# Patient Record
Sex: Female | Born: 1935 | Race: White | Hispanic: No | Marital: Married | Smoking: Former smoker
Health system: Southern US, Community
[De-identification: ages and names within clinical notes are randomized; demographics above are authoritative.]

## PROBLEM LIST (undated history)

## (undated) DIAGNOSIS — A64 Unspecified sexually transmitted disease: Secondary | ICD-10-CM

## (undated) DIAGNOSIS — N838 Other noninflammatory disorders of ovary, fallopian tube and broad ligament: Secondary | ICD-10-CM

## (undated) DIAGNOSIS — N952 Postmenopausal atrophic vaginitis: Secondary | ICD-10-CM

## (undated) DIAGNOSIS — D219 Benign neoplasm of connective and other soft tissue, unspecified: Secondary | ICD-10-CM

## (undated) DIAGNOSIS — N816 Rectocele: Secondary | ICD-10-CM

## (undated) DIAGNOSIS — E7800 Pure hypercholesterolemia, unspecified: Secondary | ICD-10-CM

## (undated) DIAGNOSIS — N814 Uterovaginal prolapse, unspecified: Secondary | ICD-10-CM

## (undated) DIAGNOSIS — S2249XA Multiple fractures of ribs, unspecified side, initial encounter for closed fracture: Secondary | ICD-10-CM

## (undated) DIAGNOSIS — M1990 Unspecified osteoarthritis, unspecified site: Secondary | ICD-10-CM

## (undated) DIAGNOSIS — R7989 Other specified abnormal findings of blood chemistry: Secondary | ICD-10-CM

## (undated) DIAGNOSIS — I10 Essential (primary) hypertension: Secondary | ICD-10-CM

## (undated) DIAGNOSIS — S42009A Fracture of unspecified part of unspecified clavicle, initial encounter for closed fracture: Secondary | ICD-10-CM

## (undated) DIAGNOSIS — E039 Hypothyroidism, unspecified: Secondary | ICD-10-CM

## (undated) DIAGNOSIS — IMO0002 Reserved for concepts with insufficient information to code with codable children: Secondary | ICD-10-CM

## (undated) DIAGNOSIS — S129XXA Fracture of neck, unspecified, initial encounter: Secondary | ICD-10-CM

## (undated) HISTORY — PX: OOPHORECTOMY: SHX86

## (undated) HISTORY — DX: Other noninflammatory disorders of ovary, fallopian tube and broad ligament: N83.8

## (undated) HISTORY — DX: Other specified abnormal findings of blood chemistry: R79.89

## (undated) HISTORY — PX: OTHER SURGICAL HISTORY: SHX169

## (undated) HISTORY — DX: Fracture of neck, unspecified, initial encounter: S12.9XXA

## (undated) HISTORY — DX: Pure hypercholesterolemia, unspecified: E78.00

## (undated) HISTORY — PX: CATARACT EXTRACTION: SUR2

## (undated) HISTORY — PX: FOOT SURGERY: SHX648

## (undated) HISTORY — DX: Hypothyroidism, unspecified: E03.9

## (undated) HISTORY — PX: THYROIDECTOMY, PARTIAL: SHX18

## (undated) HISTORY — DX: Essential (primary) hypertension: I10

## (undated) HISTORY — DX: Reserved for concepts with insufficient information to code with codable children: IMO0002

## (undated) HISTORY — DX: Benign neoplasm of connective and other soft tissue, unspecified: D21.9

## (undated) HISTORY — DX: Uterovaginal prolapse, unspecified: N81.4

## (undated) HISTORY — DX: Rectocele: N81.6

## (undated) HISTORY — DX: Unspecified osteoarthritis, unspecified site: M19.90

## (undated) HISTORY — DX: Postmenopausal atrophic vaginitis: N95.2

## (undated) HISTORY — DX: Fracture of unspecified part of unspecified clavicle, initial encounter for closed fracture: S42.009A

## (undated) HISTORY — DX: Multiple fractures of ribs, unspecified side, initial encounter for closed fracture: S22.49XA

## (undated) HISTORY — DX: Unspecified sexually transmitted disease: A64

---

## 1998-07-31 ENCOUNTER — Emergency Department (HOSPITAL_COMMUNITY): Admission: EM | Admit: 1998-07-31 | Discharge: 1998-07-31 | Payer: Self-pay | Admitting: Emergency Medicine

## 1999-01-17 ENCOUNTER — Other Ambulatory Visit: Admission: RE | Admit: 1999-01-17 | Discharge: 1999-01-17 | Payer: Self-pay | Admitting: Obstetrics and Gynecology

## 2000-01-18 ENCOUNTER — Other Ambulatory Visit: Admission: RE | Admit: 2000-01-18 | Discharge: 2000-01-18 | Payer: Self-pay | Admitting: Obstetrics and Gynecology

## 2000-02-09 ENCOUNTER — Encounter (INDEPENDENT_AMBULATORY_CARE_PROVIDER_SITE_OTHER): Payer: Self-pay

## 2000-02-09 ENCOUNTER — Other Ambulatory Visit: Admission: RE | Admit: 2000-02-09 | Discharge: 2000-02-09 | Payer: Self-pay | Admitting: Obstetrics and Gynecology

## 2001-01-06 ENCOUNTER — Encounter: Admission: RE | Admit: 2001-01-06 | Discharge: 2001-01-06 | Payer: Self-pay | Admitting: Specialist

## 2001-01-06 ENCOUNTER — Encounter: Payer: Self-pay | Admitting: Specialist

## 2001-01-17 ENCOUNTER — Other Ambulatory Visit: Admission: RE | Admit: 2001-01-17 | Discharge: 2001-01-17 | Payer: Self-pay | Admitting: Obstetrics and Gynecology

## 2001-03-21 ENCOUNTER — Encounter (INDEPENDENT_AMBULATORY_CARE_PROVIDER_SITE_OTHER): Payer: Self-pay | Admitting: Specialist

## 2001-03-21 ENCOUNTER — Ambulatory Visit (HOSPITAL_COMMUNITY): Admission: RE | Admit: 2001-03-21 | Discharge: 2001-03-21 | Payer: Self-pay | Admitting: Oncology

## 2001-03-21 ENCOUNTER — Encounter (HOSPITAL_COMMUNITY): Payer: Self-pay | Admitting: Oncology

## 2001-04-04 ENCOUNTER — Encounter (HOSPITAL_COMMUNITY): Payer: Self-pay | Admitting: Oncology

## 2001-04-04 ENCOUNTER — Ambulatory Visit (HOSPITAL_COMMUNITY): Admission: RE | Admit: 2001-04-04 | Discharge: 2001-04-04 | Payer: Self-pay | Admitting: Oncology

## 2001-06-19 ENCOUNTER — Ambulatory Visit (HOSPITAL_COMMUNITY): Admission: RE | Admit: 2001-06-19 | Discharge: 2001-06-19 | Payer: Self-pay | Admitting: *Deleted

## 2001-06-19 ENCOUNTER — Encounter (INDEPENDENT_AMBULATORY_CARE_PROVIDER_SITE_OTHER): Payer: Self-pay

## 2002-01-21 ENCOUNTER — Other Ambulatory Visit: Admission: RE | Admit: 2002-01-21 | Discharge: 2002-01-21 | Payer: Self-pay | Admitting: Obstetrics and Gynecology

## 2002-06-04 HISTORY — PX: VAGINAL HYSTERECTOMY: SUR661

## 2002-07-23 ENCOUNTER — Encounter: Payer: Self-pay | Admitting: Obstetrics and Gynecology

## 2002-07-28 ENCOUNTER — Inpatient Hospital Stay (HOSPITAL_COMMUNITY): Admission: RE | Admit: 2002-07-28 | Discharge: 2002-07-30 | Payer: Self-pay | Admitting: Obstetrics and Gynecology

## 2002-07-28 ENCOUNTER — Encounter (INDEPENDENT_AMBULATORY_CARE_PROVIDER_SITE_OTHER): Payer: Self-pay | Admitting: Specialist

## 2002-11-20 ENCOUNTER — Encounter: Payer: Self-pay | Admitting: Specialist

## 2002-11-26 ENCOUNTER — Inpatient Hospital Stay (HOSPITAL_COMMUNITY): Admission: RE | Admit: 2002-11-26 | Discharge: 2002-11-30 | Payer: Self-pay | Admitting: Specialist

## 2003-12-17 ENCOUNTER — Encounter: Admission: RE | Admit: 2003-12-17 | Discharge: 2003-12-17 | Payer: Self-pay | Admitting: Obstetrics and Gynecology

## 2004-01-18 ENCOUNTER — Encounter: Admission: RE | Admit: 2004-01-18 | Discharge: 2004-01-18 | Payer: Self-pay | Admitting: Family Medicine

## 2004-01-24 ENCOUNTER — Other Ambulatory Visit: Admission: RE | Admit: 2004-01-24 | Discharge: 2004-01-24 | Payer: Self-pay | Admitting: Obstetrics and Gynecology

## 2004-03-27 ENCOUNTER — Emergency Department (HOSPITAL_COMMUNITY): Admission: EM | Admit: 2004-03-27 | Discharge: 2004-03-28 | Payer: Self-pay | Admitting: Emergency Medicine

## 2005-04-16 ENCOUNTER — Encounter: Admission: RE | Admit: 2005-04-16 | Discharge: 2005-04-16 | Payer: Self-pay | Admitting: Family Medicine

## 2006-01-30 ENCOUNTER — Other Ambulatory Visit: Admission: RE | Admit: 2006-01-30 | Discharge: 2006-01-30 | Payer: Self-pay | Admitting: Obstetrics and Gynecology

## 2007-06-05 HISTORY — PX: HERNIA REPAIR: SHX51

## 2008-01-16 ENCOUNTER — Encounter: Admission: RE | Admit: 2008-01-16 | Discharge: 2008-01-16 | Payer: Self-pay | Admitting: Surgery

## 2008-01-20 ENCOUNTER — Ambulatory Visit (HOSPITAL_BASED_OUTPATIENT_CLINIC_OR_DEPARTMENT_OTHER): Admission: RE | Admit: 2008-01-20 | Discharge: 2008-01-20 | Payer: Self-pay | Admitting: Surgery

## 2008-02-10 ENCOUNTER — Encounter: Payer: Self-pay | Admitting: Obstetrics and Gynecology

## 2008-02-10 ENCOUNTER — Other Ambulatory Visit: Admission: RE | Admit: 2008-02-10 | Discharge: 2008-02-10 | Payer: Self-pay | Admitting: Obstetrics and Gynecology

## 2008-02-10 ENCOUNTER — Ambulatory Visit: Payer: Self-pay | Admitting: Obstetrics and Gynecology

## 2008-05-25 ENCOUNTER — Inpatient Hospital Stay (HOSPITAL_COMMUNITY): Admission: EM | Admit: 2008-05-25 | Discharge: 2008-06-01 | Payer: Self-pay | Admitting: Emergency Medicine

## 2008-05-25 ENCOUNTER — Ambulatory Visit: Payer: Self-pay | Admitting: Diagnostic Radiology

## 2008-05-25 ENCOUNTER — Encounter: Payer: Self-pay | Admitting: Emergency Medicine

## 2009-02-10 ENCOUNTER — Other Ambulatory Visit: Admission: RE | Admit: 2009-02-10 | Discharge: 2009-02-10 | Payer: Self-pay | Admitting: Obstetrics and Gynecology

## 2009-02-10 ENCOUNTER — Encounter: Payer: Self-pay | Admitting: Obstetrics and Gynecology

## 2009-02-10 ENCOUNTER — Ambulatory Visit: Payer: Self-pay | Admitting: Obstetrics and Gynecology

## 2009-02-22 ENCOUNTER — Ambulatory Visit: Payer: Self-pay | Admitting: Obstetrics and Gynecology

## 2009-06-04 HISTORY — PX: REPLACEMENT TOTAL KNEE: SUR1224

## 2009-09-06 ENCOUNTER — Emergency Department (HOSPITAL_BASED_OUTPATIENT_CLINIC_OR_DEPARTMENT_OTHER): Admission: EM | Admit: 2009-09-06 | Discharge: 2009-09-07 | Payer: Self-pay | Admitting: Emergency Medicine

## 2009-09-06 ENCOUNTER — Ambulatory Visit: Payer: Self-pay | Admitting: Diagnostic Radiology

## 2010-01-17 ENCOUNTER — Inpatient Hospital Stay (HOSPITAL_COMMUNITY): Admission: RE | Admit: 2010-01-17 | Discharge: 2010-01-19 | Payer: Self-pay | Admitting: Orthopedic Surgery

## 2010-02-28 ENCOUNTER — Ambulatory Visit: Payer: Self-pay | Admitting: Obstetrics and Gynecology

## 2010-08-17 LAB — TYPE AND SCREEN: ABO/RH(D): O POS

## 2010-08-17 LAB — BASIC METABOLIC PANEL
CO2: 23 mEq/L (ref 19–32)
CO2: 27 mEq/L (ref 19–32)
Calcium: 8.9 mg/dL (ref 8.4–10.5)
Chloride: 111 mEq/L (ref 96–112)
Creatinine, Ser: 1 mg/dL (ref 0.4–1.2)
GFR calc Af Amer: >60 mL/min (ref >60)
GFR calc Af Amer: >60 mL/min (ref >60)
GFR calc non Af Amer: 54 mL/min — ABNORMAL LOW (ref >60)
Glucose, Bld: 110 mg/dL — ABNORMAL HIGH (ref 70–99)
Glucose, Bld: 141 mg/dL — ABNORMAL HIGH (ref 70–99)

## 2010-08-17 LAB — CBC
MCHC: 33.8 g/dL (ref 30.0–36.0)
MCV: 96.7 fL (ref 78.0–100.0)
RDW: 12.9 % (ref 11.5–15.5)
WBC: 8.3 10*3/uL (ref 4.0–10.5)
WBC: 9.2 10*3/uL (ref 4.0–10.5)

## 2010-08-17 LAB — ABO/RH: ABO/RH(D): O POS

## 2010-08-18 LAB — URINALYSIS, ROUTINE W REFLEX MICROSCOPIC
Bilirubin Urine: NEGATIVE
Glucose, UA: NEGATIVE mg/dL
Hgb urine dipstick: NEGATIVE
Protein, ur: NEGATIVE mg/dL
Specific Gravity, Urine: 1.025 (ref 1.005–1.030)
Urobilinogen, UA: 0.2 mg/dL (ref 0.0–1.0)
pH: 7 (ref 5.0–8.0)

## 2010-08-18 LAB — URINE MICROSCOPIC-ADD ON

## 2010-08-18 LAB — CBC
MCH: 33.1 pg (ref 26.0–34.0)
MCHC: 34.5 g/dL (ref 30.0–36.0)
RDW: 13 % (ref 11.5–15.5)
WBC: 9.4 10*3/uL (ref 4.0–10.5)

## 2010-08-18 LAB — BASIC METABOLIC PANEL
BUN: 21 mg/dL (ref 6–23)
Chloride: 106 mEq/L (ref 96–112)
Creatinine, Ser: 0.85 mg/dL (ref 0.4–1.2)
Glucose, Bld: 99 mg/dL (ref 70–99)
Potassium: 4.6 mEq/L (ref 3.5–5.1)
Sodium: 142 mEq/L (ref 135–145)

## 2010-08-18 LAB — DIFFERENTIAL
Eosinophils Absolute: 0.4 10*3/uL (ref 0.0–0.7)
Lymphocytes Relative: 27 % (ref 12–46)
Lymphs Abs: 2.5 10*3/uL (ref 0.7–4.0)

## 2010-10-17 NOTE — Op Note (Signed)
NAMEMarland Webb  VERALYN, LOPP NO.:  [id number]   MEDICAL RECORD NO.:  [id number]          PATIENT TYPE:  AMB   LOCATION:  DSC                          FACILITY:  [facility]   PHYSICIAN:  Currie Paris, M.D.DATE OF BIRTH:  1933/05/02   DATE OF PROCEDURE:  01/20/2008  DATE OF DISCHARGE:                               OPERATIVE REPORT   PREOPERATIVE DIAGNOSIS:  Umbilical hernia.   POSTOPERATIVE DIAGNOSIS:  Umbilical hernia.   OPERATION:  Repair of umbilical hernia with mesh.   SURGEON:  Currie Paris, MD   ANESTHESIA:  General (LMA).   CLINICAL HISTORY:  [name] is a 77 year old lady with a symptomatic  umbilical hernia that she wished to have repaired.   DESCRIPTION OF PROCEDURE:  The patient was seen in the holding area and  plans for surgery as above outlined and reviewed.  The patient had no  further questions.  Identified and marked the umbilical area as the  surgical site.   The patient was taken to the operating room and after satisfactory  general (LMA) anesthesia had been obtained, the abdomen was prepped and  draped.  A time-out was done.   To help with postop pain relief, I injected 0.25% plain Marcaine around  the umbilical area.  I then made a curvilinear incision at the base of  the umbilicus.   The umbilical skin was elevated and the hernia sac opened and the  omentum that was protruding out reduced.  I cleaned off the fascia so I  had a good fascial edge to do a repair.  Once everything cleaned out, I  appeared to have just some peritoneum and omentum up against the  umbilicus.  I closed the defect with interrupted 0 Prolene using three  sutures and placing a small mesh plug in to help anchor the sutures.  These tied down easily and with no tension.   Additional local was infiltrated to help with postop pain relief.  Everything appeared to be dry.   The incision was closed with 3-0 Vicryl to tack the deeper layers closed  and then 4-0  Monocryl subcuticular Dermabond for the skin.   The patient tolerated the procedure well and there were no  complications.  All counts were correct.      Currie Paris, M.D.  Electronically Signed     CJS/MEDQ  D:  01/20/2008  T:  01/20/2008  Job:  [id number]   cc:   L. Lupe Carney, M.D.

## 2010-10-17 NOTE — Op Note (Signed)
NAMEMarland Kitchen  Kimberly Webb, Kimberly Webb NO.:  [id number]   MEDICAL RECORD NO.:  [id number]          PATIENT TYPE:  INP   LOCATION:  1607                         FACILITY:  [facility]   PHYSICIAN:  Madlyn Frankel. Charlann Boxer, M.D.  DATE OF BIRTH:  Jul 21, 1932   DATE OF PROCEDURE:  05/29/2008  DATE OF DISCHARGE:                               OPERATIVE REPORT   PREOPERATIVE DIAGNOSIS:  Closed left periprosthetic proximal  tibia/fibula fracture.   POSTOPERATIVE DIAGNOSIS:  Closed left periprosthetic proximal  tibia/fibula fracture.   PROCEDURE:  Closed reduction under radiographic imaging with stress  radiography with long leg cast left lower extremity incorporating the  ankle and knee to the mid thigh.   SURGEON:  Madlyn Frankel. Charlann Boxer, M.D.   ASSISTANT:  Surgical techs and nursing.   ANESTHESIA:  General LMA.   FINDINGS:  None.   SPECIMENS:  None.   COMPLICATIONS:  None.   BLOOD LOSS:  None.   INDICATIONS FOR PROCEDURE:  [name] is a 77 year old female with a  history of a remote left total knee replacement.  On December 22 she  unfortunately fell with her knee hyperflexed and behind her.  She had  immediate onset of pain and inability to bear weight.  She was admitted  to the hospital on December 27 by Dr. Beverely Low who had done the  initial splinting.  She was in a lot of pain with recurrent splinting  and wished to be reevaluated.  Dr. Ranell Patrick had asked and consulted me  early on.  I had discussed with the patient options including external  fixator versus casting.  Risks, benefits and pros and cons of both were  reviewed.  Consent was obtained for closed reduction and casting versus  external fixator.   PROCEDURE IN DETAIL:  The patient was brought to the operative theater.  Once adequate anesthesia, preoperative antibiotics were not given, the  patient was positioned supine.   We went ahead and removed the old splint.  There was no cuts,  lacerations, no significant bruising and  her compartments were all soft  on examination.   We placed a 4 inch stockinette on the lower extremity then wrapped her  lower extremity with cast padding.  We tried to keep her knee at about  50 degrees of flexion.   I then placed a 3 inch fiberglass casting along her ankle to midleg to  allow for better holding of extremity.  I then examined the fracture  under fluoroscopic imaging and identified reduction maneuvers.  After I  did this we then applied 5 inch fiberglass casting above the knee to the  thigh.  At this point we had fluoroscopic imaging and I held the  fracture reduced anatomic position while the fiberglass was setting up  under fluoroscopic imaging.  There was anatomic alignment in the AP  view, just a bit of flexion at the fracture site on the lateral view.  I  was happy with this position and once the fiberglass set up final  radiographs were obtained.  Once the fiberglass had set up the patient  was awakened  from her anesthesia and brought to the recovery room in  stable condition.      Madlyn Frankel Charlann Boxer, M.D.  Electronically Signed     MDO/MEDQ  D:  05/29/2008  T:  05/30/2008  Job:  [id number]

## 2010-10-17 NOTE — Discharge Summary (Signed)
NAMEMarland Kitchen  VALLI, Kimberly NO.:  [id number]   MEDICAL RECORD NO.:  [id number]          PATIENT TYPE:  INP   LOCATION:  1607                         FACILITY:  [facility]   PHYSICIAN:  Madlyn Frankel. Charlann Boxer, M.D.  DATE OF BIRTH:  01/07/1933   DATE OF ADMISSION:  05/25/2008  DATE OF DISCHARGE:  06/01/2008                               DISCHARGE SUMMARY   ADMITTING DIAGNOSES:  1. Hypertension.  2. Hyperthyroid.  3. Herpes simplex.   DISCHARGE DIAGNOSES:  1. Hypertension.  2. Hyperthyroid.  3. Herpes simplex.  4. Osteoarthritis with left knee periprosthetic fracture of the tibia      and fibula.   HISTORY OF PRESENT ILLNESS:  A 77 year old female slipped on some ice  outsider her house, landed on her left knee, had immediate pain, brought  to the emergency department where x-rays revealed a mildly displaced  fracture of the left tibia and fibula metaphyses.  This was distal to  the tibial prosthesis.   She was admitted to hospital for surgical treatment.   CONSULTS:  None.   PROCEDURE:  Closed reduction of her left knee with long leg casting.   SURGEON:  Dr. Durene Romans.   LABORATORY DATA:  No labs other than those done in the emergency  department.   RADIOLOGY:  1. Left knee, 2 view, showed a periprosthetic fracture of her left      knee distal to the tibial implants over tibia and fibula involving      the metaphysis.  Postoperatively, showed satisfactory appearance in      long leg cast.  2. Chest, 1 view, showed cardiomegaly and chronic interstitial      changes, stable deviation of the trachea, no acute cardiopulmonary      disease.   CARDIOLOGY:  EKG showed sinus rhythm.   HOSPITAL COURSE:  Patient admitted through the emergency department  orthopedic service.  She was treated for pain.  Care was transferred to  Dr. Charlann Boxer who performed a closed reduction on May 29, 2008.  Her  pain was controlled with PCA then switched over to oral meds.  She was  in  a long leg cast.  She was non-weightbearing with this left lower  extremity.  Due to her difficulty with ambulation, she was SNF rehab  candidate.  Her stay was unremarkable.  We we saw her on June 01, 2008, she was afebrile, neurovascularly intact, this left lower  extremity non-weightbearing.   DISCHARGE DISPOSITION:  Discharge to skilled nurse facility rehab in  stable and improved condition.  Non-weightbearing of her left lower  extremity.   DISCHARGE PHYSICAL THERAPY:  Non-weightbearing left lower extremity.  Want to work on upper extremity strengthening, as well as a lower  extremity isometric exercises as tolerated.  Maintain hip flexor  strength.   DISCHARGE DIET:  Heart healthy.   DISCHARGE WOUND:  There is no wound but keep cast dry.   DISCHARGE FOLLOWUP:  With Dr. Charlann Boxer at [phone] in 2 weeks.   DISCHARGE MEDICATIONS:  1. Lovenox 40 mg subcu q.24 x10 days, then start enteric-coated  aspirin 325 mg 1 p.o. daily x4 weeks.  2. Robaxin 500 mg 1 p.o. q.6 muscle spasm pain p.r.n.  3. Cipro 500 mg 1 p.o. b.i.d. until the end of June 06, 2008.  4. Norco 7.5/325 one to two p.o. q.4 to 6 p.r.n. pain.  5. Accupril 40 mg daily.  6. Acyclovir 400 mg t.i.d.  7. Hydrochlorothiazide 25 mg daily.  8. Synthroid 100 mcg daily.  9. Zocor 40 mg daily.   DISCHARGE FOLLOWUP:  Follow up with Dr. Charlann Boxer at [phone] in 2 weeks.     ______________________________  Yetta Glassman [address], [place]      Madlyn Frankel. Charlann Boxer, M.D.  Electronically Signed    BLM/MEDQ  D:  06/01/2008  T:  06/01/2008  Job:  [id number]   cc:   Primary Care Physician of record

## 2010-10-20 NOTE — Op Note (Signed)
NAME:  Kimberly, Webb                         ACCOUNT NO.:  [id number]   MEDICAL RECORD NO.:  [id number]                   PATIENT TYPE:  INP   LOCATION:  0001                                 FACILITY:  [facility]   PHYSICIAN:  Daniel L. Eda Paschal, M.D.           DATE OF BIRTH:  Sep 24, 1932   DATE OF PROCEDURE:  07/28/2002  DATE OF DISCHARGE:                                 OPERATIVE REPORT   PREOPERATIVE DIAGNOSIS:  Uterine prolapse, cystocele and rectocele.   POSTOPERATIVE DIAGNOSIS:  Uterine prolapse, cystocele and rectocele.   OPERATION:  Vaginal  hysterectomy, bilateral salpingo-oophorectomy, anterior  and posterior repair.   SURGEON:  Daniel L. Eda Paschal, M.D.   FIRST ASSISTANT:  Katy Fitch, M.D.   FINDINGS:  At the time of the surgery the patient had second degree uterine  prolapse, third degree cystocele, 1.5 degree rectocele. It was noted that  her vaginal fascia was very attenuated  and was not particularly healthy.  Ovaries and fallopian tubes were normal. The pelvic peritoneum was normal.  There was no enterocele present.   DESCRIPTION OF PROCEDURE:  After adequate general endotracheal anesthesia  the patient was placed in the dorsal lithotomy position and prepped and  draped in the usual sterile manner. During induction of anesthesia the  patient became extremely hypertensive; some of that appeared to be due to  anxiety and difficulty in starting and IV. However she quickly returned to a  fairly normal blood pressure. She had already taken her blood pressure  medications this morning.   After she was prepped and draped and her blood pressure was stable, a  1:200,000 solution of epinephrine and 0.5% Xylocaine was injected around the  cervix. A 360 degree incision was made around the cervix. The bladder was  mobilized superiorly as was the posterior peritoneum. The posterior  peritoneum and vesicouterine fold of the peritoneum were entered with sharp   dissection.   The uterosacral and cardinal ligaments were clamped. They were shortened.  They were sutured to the vault laterally for good vault support. The uterine  arteries were clamped, cut, and doubly suture ligated. The balance of the  broad ligament was then cut and suture ligature. We used #1 chromic catgut  for all of the above materials. The uterus was removed and sent to pathology  for tissue diagnosis.   First the right adnexa and the left adnexa were delivered. The IP ligaments  were clamped, cut and doubly suture ligated with #1 chromic catgut and the  ovaries and tubes were all sent to pathology for tissue diagnosis. The  vaginal  cuff was whip stitched to the peritoneum with a running locking 0  Vicryl. Copious irrigation was done with Ringer's lactate. A modified  McCall's enterocystocele suture of 2-0 Vicryl was placed and then attention  was turned to the anterior repair.   The anterior vaginal  mucosa was undermined to the level of the urethra. A  Foley catheter was inserted into the bladder which drained clear urine. The  vesicovaginal fascia as well as the cystocele was dissected free from the  vaginal  mucosa. It was noted that her tissues were not particularly good.  Redundant vaginal  mucosa was trimmed away and then the cystocele was  reduced with 10 sutures of 2-0 Vicryl, incorporating vesicovaginal fascia.   Redundant mucosa was  trimmed away and then the anterior mucosa was closed  with a running locking 2-0 Vicryl, picking up the fascia below for good  support as well as to eliminate dead space. The cuff was then closed with  figure-of-8s of 0 Vicryl.   Attention was then  turned to the posterior repair. The posterior mucosa was  undermined to the top of the cuff. The perirectal fascia was sharply  dissected free. The surgeon put his finger into the rectum to  finish the  dissection, and then the rectocele was reduced with approximately 6  interrupteds  of 2-0 Vicryl incorporating the perirectal fascia as well as  the rectocele itself.   Redundant vaginal mucosa was trimmed away. The posterior vaginal mucosa was  then closed with a running 2-0 Vicryl, also picking up tissue underneath to  eliminate dead space. The perineum was well supported so no dissection was  done there and a 1 inch iodoform pack was placed to finish the procedure.   Estimated blood loss for the entire procedure was 100 cc with none replaced.  The patient tolerated the procedure well and left the operating room in  satisfactory condition, draining clear urine from her Foley catheter.                                               Daniel L. Eda Paschal, M.D.    Tonette Bihari  D:  07/28/2002  T:  07/28/2002  Job:  [id number]

## 2010-10-20 NOTE — Discharge Summary (Signed)
   NAME:  Kimberly Webb, DRIVER                         ACCOUNT NO.:  [id number]   MEDICAL RECORD NO.:  [id number]                   PATIENT TYPE:  INP   LOCATION:  0440                                 FACILITY:  [facility]   PHYSICIAN:  Rande Brunt. Eda Paschal, M.D.           DATE OF BIRTH:  08-27-1932   DATE OF ADMISSION:  07/28/2002  DATE OF DISCHARGE:  07/30/2002                                 DISCHARGE SUMMARY   HISTORY OF PRESENT ILLNESS:  The patient is a 77 year old female who was  admitted to the hospital with uterine prolapse, cystocele, and rectocele for  definitive surgery.   HOSPITAL COURSE:  On the day of admission, she was taken to the operating  room, a vaginal hysterectomy, bilateral salpingo-oophorectomy, anterior and  posterior repair were performed.  On the second postoperative day, her Foley  was removed, her pack was removed, she voided well, and she was ready for  discharge.   DISCHARGE MEDICATIONS:  Darvocet-N 100 for pain relief.   DIET:  Regular.   ACTIVITY:  Ambulatory.   FOLLOWUP:  She is to return to the office in three weeks for further  followup.   Final pathology report revealed a cervix with slight hyperkeratosis and  squamous metaplasia, endometrium weakly proliferative, subserosal leiomyoma,  benign uterine serosa, normal ovaries and fallopian tubes except for a  benign paratubal cyst on the left.   CONDITION ON DISCHARGE:  Improved.   DISCHARGE DIAGNOSES:  1. Symptomatic uterine prolapse.  2. Cystocele.  3. Rectocele.  4. Leiomyoma.  5. Benign paratubal cyst.   OPERATIONS:  Vaginal hysterectomy, bilateral salpingo-oophorectomy, anterior  and posterior repair.                                                Daniel L. Eda Paschal, M.D.    Tonette Bihari  D:  08/08/2002  T:  08/08/2002  Job:  [id number]

## 2010-10-20 NOTE — H&P (Signed)
NAME:  Kimberly Webb, Kimberly Webb                         ACCOUNT NO.:  [id number]   MEDICAL RECORD NO.:  [id number]                   PATIENT TYPE:  INP   LOCATION:  NA                                   [facility]ACILITY:  [facility]   PHYSICIAN:  Ronnell Guadalajara, M.D.                DATE OF BIRTH:  Sep 26, 1932   DATE OF ADMISSION:  11/26/2002  DATE OF DISCHARGE:                                HISTORY & PHYSICAL   CHIEF COMPLAINT:  Left knee pain.   HISTORY OF PRESENT ILLNESS:  The patient is a 77 year old female with a  history of severe left knee pain. She states that her knee is starting to  give her more and more trouble and is interfering with her activities of  daily living. MRI demonstrated significant disease in the medial compartment  and mild disease in the lateral compartment. She is at the point now where  she wants something definitively done. Dr. Montez Morita felt it is best to go  ahead and proceed with a total knee replacement. The risks and benefits of  the procedure were discussed with the patient and the patient wishes to  proceed.   PAST MEDICAL HISTORY:  1. Hypertension.  2. Hypothyroidism.  3. Hypercholesterolemia.  4. Gastroesophageal reflux disease.  5. Herpes simplex around her lips.   PAST SURGICAL HISTORY:  1. Hysterectomy.  2. Partial thyroidectomy.  3. Left knee scope x2.   MEDICATIONS:  1. Zocor 40 mg one p.o. daily.  2. Accupril 20 mg one p.o. daily.  3. HCTZ 25 mg one p.o. daily.  4. Synthroid 100 mcg one p.o. daily.  5. Calcium 600 mg one p.o. t.i.d.  6. Bextra 10 mg one p.o. daily.  7. Prilosec 20 mg one p.o. daily.  8. Acyclovir 1000 mg one p.o. daily.  9. Niacin.  She does not know the dosage. She will take it to pre-admission     testing.   ALLERGIES:  No known drug allergies.   SOCIAL HISTORY:  The patient denies any tobacco or alcohol use. She is  married. She lives in a one-story house with two steps entering her house,  and her husband will be her  caregiver after surgery.   FAMILY HISTORY:  Mother deceased of coronary artery disease. Father deceased  of hypertension. Sister also with hypertension.   REVIEW OF SYSTEMS:  GENERAL: Denies fever, chills, night sweats, bleeding  tendencies. CNS: Denies blurry or double vision, seizures, headaches,  paralysis. RESPIRATORY: Denies shortness of breath, productive cough, or  hemoptysis. CV: Denies chest pain, angina, or orthopnea. GI: Denies nausea,  vomiting, diarrhea, constipation, melena, or bloody stools. GU: Denies  dysuria, hematuria, or discharge. MUSCULOSKELETAL: As pertinent to HPI.   PHYSICAL EXAM:  Blood pressure 160/80, pulse 93, respiratory rate 16.  GENERAL: Well-developed, well-nourished, 77 year old female.  HEENT: Normocephalic, atraumatic. Pupils are equal, round, and react to  light.  NECK: Supple. No carotid  bruit noted.  CHEST: Clear to auscultation bilaterally. No wheezes or crackles.  HEART: Regular rate and rhythm. No murmurs, rubs, or gallops.  ABDOMEN: Soft, nontender, nondistended. Positive bowel sounds x4.  EXTREMITIES: The patient is tender to palpation in bilateral joint lines.  She has decreased range of motion due to pain. She walks with a slight  antalgic gait, and she is neurovascularly intact distally.  SKIN: No rashes or lesions.   LAB/X-RAY:  MRI reveals a significant lesion in the medial compartment and  mild disease in the lateral compartment.   IMPRESSION:  1. Osteoarthritis, left knee.  2. Hypertension.  3. Hypothyroidism.  4. Hypercholesterolemia.  5. Gastroesophageal reflux disease.  6. History of herpes simplex around her lips.   PLAN:  The patient will be admitted to [facility] on November 26, 2002, and undergo a left total knee arthroplasty by Dr. Debria Garret.       Clarene Reamer, P.A.-C.                   Ronnell Guadalajara, M.D.    SW/MEDQ  D:  11/19/2002  T:  11/19/2002  Job:  [id number]

## 2010-10-20 NOTE — Discharge Summary (Signed)
NAME:  Kimberly Webb, Kimberly Webb                         ACCOUNT NO.:  [id number]   MEDICAL RECORD NO.:  [id number]                   PATIENT TYPE:  INP   LOCATION:  0469                                 FACILITY:  [facility]   PHYSICIAN:  Ronnell Guadalajara, M.D.                DATE OF BIRTH:  Jun 17, 1932   DATE OF ADMISSION:  11/26/2002  DATE OF DISCHARGE:  11/30/2002                                 DISCHARGE SUMMARY   OPERATION:  On November 26, 2002, the patient underwent left total knee  replacement arthroplasty utilizing an [employer] posterior cruciate  sacrificing system with all three components cemented.  Dr. Fayrene Fearing [place]  assisted.   ADMITTING DIAGNOSES:  1. Hypothyroidism.  2. Hypertension.  3. Gastroesophageal reflux disease.  4. History of anemia.  5. Hypercholesterolemia.   DISCHARGE DIAGNOSES:  1. Hypothyroidism.  2. Hypertension.  3. Gastroesophageal reflux disease.  4. History of anemia.  5. Hypercholesterolemia.  6. Postoperative anemia.   BRIEF HISTORY:  This 77 year old lady with progressive degenerative joint  disease of the left knee had failed conservative measures.  It is felt she  would benefit from surgical intervention and was admitted for total knee  replacement arthroplasty to the left knee.   COURSE IN THE HOSPITAL:  The patient tolerated the surgical procedure quite  well.  The Hemovac, which was placed intraoperatively, was removed without  difficulty.  The patient progressed through her physical therapy program  with total knee protocol.  The wound remained dry.  She was placed on iron  postoperatively to assist in the postoperative anemia.  She was also placed  on Coumadin protocol for the risk of DVT.  She had no critical incidents  while in the hospital.  She was ambulating in the hall, was able to get  about.  Neurovascular is intact to the left lower extremity, the wound was  dry, and calf was soft at discharge.  She felt she could be maintained in  her  home environment with [facility] providing care, and  arrangements were made for discharge.   LABORATORY VALUES:  Laboratory values in the hospital hematologically showed  a CBC with differential.  Preoperatively, with actually no anemia.  Her  hemoglobin was 13.6 with hematocrit of 39.2.  Final hemoglobin was 10.2,  hematocrit 29.7.  Blood chemistries were normal.  Chest x-ray showed no  acute cardiopulmonary process.  Electrocardiogram showed normal sinus  rhythm.   CONDITION ON DISCHARGE:  Improved, stable.   PLAN:  1. The patient is discharged to her home in the care of her family to     continue with her total knee protocol at home.  2. She is to return to see Dr. Montez Morita in approximately 10-14 days after the     date of surgery.  3. Use ice p.r.n. and a dry dressing p.r.n.   DISCHARGE MEDICATIONS:  1. Continue with home medications.  2. Coumadin per pharmacy at 4-6 weeks postoperatively.  3. Trinsicon, #60, one b.i.d.  4. Tylox #50, 1-2 q.4-6h. p.r.n. pain.  5. Robaxin 500 mg, #30, with a refill, one q.6h. p.r.n. muscle spasm.   DIET:  Continue with home diet.   SPECIAL INSTRUCTIONS:  She is to call the office should she have any  problems or questions.     Dooley L. Cherlynn June.                 Ronnell Guadalajara, M.D.    DLU/MEDQ  D:  11/30/2002  T:  11/30/2002  Job:  [id number]   cc:   L. Lupe Carney, M.D.  [address] [place]  [place] [address]  Fax: [phone]

## 2010-10-20 NOTE — Op Note (Signed)
NAME:  Kimberly Webb, Kimberly Webb                         ACCOUNT NO.:  [id number]   MEDICAL RECORD NO.:  [id number]                   PATIENT TYPE:  INP   LOCATION:  0002                                 FACILITY:  [facility]   PHYSICIAN:  Ronnell Guadalajara, M.D.                DATE OF BIRTH:  [birth year]   DATE OF PROCEDURE:  11/26/2002  DATE OF DISCHARGE:                                 OPERATIVE REPORT   PREOPERATIVE DIAGNOSIS:  Degenerative arthritis, left knee, following  supracondylar fracture, with distal tibial stress fractures x2, primary  varus wear.   POSTOPERATIVE DIAGNOSIS:  Degenerative arthritis, left knee, following  supracondylar fracture, with distal tibial stress fractures x2, primary  varus wear.   OPERATIVE PROCEDURE:  Left total knee replacement arthroplasty using an  Osteonics posterior cruciate-sacrificing system with a 9 femur, a 9 tibia, a  26 mm recessed patella component, and a 10 mm plastic bearing.   SURGEON:  Ronnell Guadalajara, M.D.   ASSISTANT:  Marlowe Kays, M.D.   PROCEDURE:  After a suitable general anesthesia, the left knee is prepped  and draped routinely and exsanguinated and an upper thigh tourniquet was  inflated to 380 mmHg.  It is let down a little after two hours at the final  time.  A midline incision is then made and a sub-vastus approach is  utilized, using a Ship broker to sublux the patella laterally without  everting it.  The distal femur is created with the slot coming in a bit more  medial than normal to rod down the center of the femur to accommodate the  slight valgus angulation of the fracture, and the distal cut of 12 mm is  taken off the femur because of the flexion contracture with the proper  alignment guide used.  The patella can then be everted, the menisci are  removed, the spine of the tibia is removed.  The femoral guide is brought in  for sizing.  Using the epicondyles, it looks like about a 4.5 degree  external rotation was  set, size 9 looked like it would be the ideal, and the  appropriate anterior and posterior and chamfer cuts were made.  There was a  little notching of the femur superiorly.  The tibia was then brought  forward.  The remaining posterior horns,  menisci, and cruciates were  removed.  A 9 tibia seemed ideal.  We elected to use intramedullary rodding  and accept it as far as it would go, realizing it would accept some distal  tibial angulation because of the distal fracture.  There was actually  lateral angulation.  There was some medial deformity of that distal  __________ and elected to just leave that and align the tibia with the  proximal tibia itself.  A central guide alignment was brought in, a 0 degree  cut was chosen, 10 mm off the normal lateral side and then cuts  were made,  after which the __________ of the femur were checked and there was no real  osteophyte or problem behind.  The notch guide was brought in, the notch was  cut, after which a trial reduction was carried out.  The 9 femur and 9 tibia  with the 10 mm tray revealed good medial and lateral stability, no lift-off  in flexion, the flexion-extension gaps seemed to be fine.  The patella was  then reamed to accept a 26 patella.  We then cut the slot for the tibia.  The wound was then thoroughly irrigated and dried.  Cement was mixed with a  gram of vancomycin, and the components were cemented with the tibia first,  the femur second, and the patella third using the tibial tray without a post  to hold the components together as cement was setting.  The final trial with  the 10 mm revealed again good stability with flexion and extension, medial-  lateral.  There was some avulsion of the medial side of the patellar tendon,  and elected to use a four-pronged Mitek into the tubercle for good  reattachment.  Minimal lateral release in the normal spot was done.  The  routine wound closure with #1 PDS suture in the capsule and medial  side but  in the vastus medialis just flopped back in the sub-vastus area, 2-0 coated  Vicryl  in the subcu, a running 3-0 subcuticular Vicryl with Steri-Strips for the  skin.  Wound closed over a Hemovac, compression dressing, tourniquet up, let  down around 2 hours 3 minutes.  Good circulation to the foot.  Pressure  dressing, ice pack.  Goes to recovery in good condition.                                                Ronnell Guadalajara, M.D.    PC/MEDQ  D:  11/26/2002  T:  11/26/2002  Job:  [id number]

## 2010-10-20 NOTE — H&P (Signed)
NAME:  Kimberly Webb, Kimberly Webb                         ACCOUNT NO.:  [id number]   MEDICAL RECORD NO.:  [id number]                   PATIENT TYPE:  INP   LOCATION:  0001                                 FACILITY:  [facility]   PHYSICIAN:  Daniel L. Eda Paschal, M.D.           DATE OF BIRTH:  Jan 28, 1933   DATE OF ADMISSION:  07/28/2002  DATE OF DISCHARGE:                                HISTORY & PHYSICAL   CHIEF COMPLAINT:  Symptomatic pelvic relaxation.   HISTORY OF PRESENT ILLNESS:  The patient is a 77 year old gravida 2, para 2,  abortus 0, who came to my office in November, presenting with a four-week  history of something falling out of her vagina.  It was interfering with  intercourse.  It is interfering with vaginal medicine.  She has a severe  pelvic dragging sensation whenever she is on her feet for any period of  time.  She has no urinary stress incontinence.  She does have a lot of  urinary frequency, and she is having a little bit more trouble getting stool  out.  Findings in the office were second-degree uterine prolapse, large  third-degree cystocele, and one first- to second-degree rectocele.  The  patient was referred to Dr. Darvin Neighbours for urodynamics, and his impression  was that she should have gynecological surgery without any urological  intervention such as a sling.  She now enters the hospital for vaginal  hysterectomy, bilateral salpingo-oophorectomy, anterior and posterior  repair.   PAST SURGICAL HISTORY:  Previous thyroid surgery in 1996.  Otherwise, no  previous surgery.   PAST MEDICAL HISTORY:  1. Hypertension.  2. Hypercholesterolemia.  3. Recurrent herpes.   MEDICATIONS:  1. Accupril 20 mg daily.  2. Hydrochlorothiazide 25 mg daily.  3. Zocor 40 mg daily.  4. Bextra 10 mg daily for osteoarthritis.  5. Synthroid 100 mcg daily for hypothyroidism.  6. Acyclovir 400 mg daily for recurrent herpes.  7. Prilosec 1 daily for GERD.   ALLERGIES:  None.   FAMILY  HISTORY:  Mother has coronary artery disease.  Mother and sister are  hypertensive.   REVIEW OF SYSTEMS:  HEENT:  Negative.  CARDIAC:  History of hypertension.  GASTROINTESTINAL:  History of GERD.  GENITOURINARY:  Cystocele.  NEUROMUSCULAR:  Osteoarthritis.  ALLERGIC:  Negative.  IMMUNOLOGICAL:  Negative.  LYMPHATIC:  Negative.  ENDOCRINE:  History of previous thyroid  surgery, followed by thyroid replacement.   PHYSICAL EXAMINATION:  GENERAL:  Well-developed, well-nourished female in no  acute distress.  VITAL SIGNS:  Blood pressure 140/70, pulse 80 and regular, respirations 16  and unlabored.  She is afebrile.  HEENT:  All within normal limits.  NECK:  Supple.  Trachea is midline.  Thyroid is not enlarged.  LUNGS:  Clear to P&A.  HEART:  No thrills, heaves, or murmurs.  BREASTS:  No masses.  ABDOMEN:  Soft.  Without guarding, rebound, or masses.  PELVIC:  External is within normal limits.  BUS is within normal limits.  Bladder reveals third-degree cystocele.  Vagina reveals a small rectocele.  Cervix is clean.  Uterus is normal size and shape with second-degree uterine  descensus.  Adnexa fails to reveal masses.  RECTAL:  Anus and perineum normal.  Digital rectal exam is negative except  for rectocele.   ADMISSION IMPRESSION:  Uterine prolapse, cystocele, rectocele.   PLAN:  As above.                                               Daniel L. Eda Paschal, M.D.    Tonette Bihari  D:  07/28/2002  T:  07/28/2002  Job:  [id number]

## 2011-01-08 ENCOUNTER — Encounter: Payer: Self-pay | Admitting: Obstetrics and Gynecology

## 2011-02-08 ENCOUNTER — Other Ambulatory Visit: Payer: Self-pay | Admitting: Obstetrics and Gynecology

## 2011-03-02 LAB — BASIC METABOLIC PANEL
BUN: 21
Calcium: 10.2
GFR calc non Af Amer: >60
Glucose, Bld: 95
Sodium: 139

## 2011-03-05 ENCOUNTER — Telehealth: Payer: Self-pay | Admitting: *Deleted

## 2011-03-05 NOTE — Telephone Encounter (Signed)
[place] Acyclovir RX. #30 called to [facility] [phone]. Patient informed called in.

## 2011-03-08 LAB — URINALYSIS, ROUTINE W REFLEX MICROSCOPIC
Nitrite: POSITIVE — AB
Specific Gravity, Urine: 1.019 (ref 1.005–1.030)
Urobilinogen, UA: 0.2 mg/dL (ref 0.0–1.0)

## 2011-03-08 LAB — URINE MICROSCOPIC-ADD ON

## 2011-03-08 LAB — CBC
Hemoglobin: 12.3 g/dL (ref 12.0–15.0)
MCHC: 34.7 g/dL (ref 30.0–36.0)
MCV: 95.6 fL (ref 78.0–100.0)
Platelets: 258 10*3/uL (ref 150–400)
RDW: 12.6 % (ref 11.5–15.5)
RDW: 12.7 % (ref 11.5–15.5)

## 2011-03-08 LAB — URINE CULTURE

## 2011-03-08 LAB — BASIC METABOLIC PANEL
BUN: 11 mg/dL (ref 6–23)
CO2: 30 mEq/L (ref 19–32)
Calcium: 10 mg/dL (ref 8.4–10.5)
Chloride: 104 mEq/L (ref 96–112)
Creatinine, Ser: 0.65 mg/dL (ref 0.4–1.2)
GFR calc Af Amer: >60 mL/min (ref >60)
GFR calc non Af Amer: >60 mL/min (ref >60)
Glucose, Bld: 123 mg/dL — ABNORMAL HIGH (ref 70–99)
Sodium: 138 mEq/L (ref 135–145)

## 2011-03-08 LAB — VITAMIN D 1,25 DIHYDROXY: Vitamin D3 1, 25 (OH)2: 22 pg/mL

## 2011-03-09 LAB — URINALYSIS, ROUTINE W REFLEX MICROSCOPIC
Bilirubin Urine: NEGATIVE
Glucose, UA: NEGATIVE mg/dL
Hgb urine dipstick: NEGATIVE
Specific Gravity, Urine: 1.026 (ref 1.005–1.030)
Urobilinogen, UA: 0.2 mg/dL (ref 0.0–1.0)

## 2011-03-09 LAB — COMPREHENSIVE METABOLIC PANEL
ALT: 44 U/L — ABNORMAL HIGH (ref 0–35)
AST: 45 U/L — ABNORMAL HIGH (ref 0–37)
Alkaline Phosphatase: 89 U/L (ref 39–117)
CO2: 27 mEq/L (ref 19–32)
Chloride: 104 mEq/L (ref 96–112)
GFR calc Af Amer: >60 mL/min (ref >60)
GFR calc non Af Amer: >60 mL/min (ref >60)
Glucose, Bld: 112 mg/dL — ABNORMAL HIGH (ref 70–99)
Potassium: 5.3 mEq/L — ABNORMAL HIGH (ref 3.5–5.1)
Sodium: 142 mEq/L (ref 135–145)

## 2011-03-09 LAB — DIFFERENTIAL
Basophils Relative: 1 % (ref 0–1)
Eosinophils Absolute: 0.2 10*3/uL (ref 0.0–0.7)
Eosinophils Relative: 2 % (ref 0–5)
Neutrophils Relative %: 78 % — ABNORMAL HIGH (ref 43–77)

## 2011-03-09 LAB — CBC
Hemoglobin: 14.4 g/dL (ref 12.0–15.0)
RBC: 4.49 MIL/uL (ref 3.87–5.11)
WBC: 13 10*3/uL — ABNORMAL HIGH (ref 4.0–10.5)

## 2011-03-09 LAB — PROTIME-INR
INR: 0.9 (ref 0.00–1.49)
Prothrombin Time: 12.6 seconds (ref 11.6–15.2)

## 2011-03-15 DIAGNOSIS — E7800 Pure hypercholesterolemia, unspecified: Secondary | ICD-10-CM | POA: Insufficient documentation

## 2011-03-15 DIAGNOSIS — N816 Rectocele: Secondary | ICD-10-CM | POA: Insufficient documentation

## 2011-03-15 DIAGNOSIS — N952 Postmenopausal atrophic vaginitis: Secondary | ICD-10-CM | POA: Insufficient documentation

## 2011-03-15 DIAGNOSIS — D219 Benign neoplasm of connective and other soft tissue, unspecified: Secondary | ICD-10-CM | POA: Insufficient documentation

## 2011-03-15 DIAGNOSIS — N814 Uterovaginal prolapse, unspecified: Secondary | ICD-10-CM | POA: Insufficient documentation

## 2011-03-15 DIAGNOSIS — M1990 Unspecified osteoarthritis, unspecified site: Secondary | ICD-10-CM | POA: Insufficient documentation

## 2011-03-15 DIAGNOSIS — E039 Hypothyroidism, unspecified: Secondary | ICD-10-CM | POA: Insufficient documentation

## 2011-03-15 DIAGNOSIS — IMO0002 Reserved for concepts with insufficient information to code with codable children: Secondary | ICD-10-CM | POA: Insufficient documentation

## 2011-03-15 DIAGNOSIS — N838 Other noninflammatory disorders of ovary, fallopian tube and broad ligament: Secondary | ICD-10-CM | POA: Insufficient documentation

## 2011-03-28 ENCOUNTER — Encounter: Payer: Self-pay | Admitting: Obstetrics and Gynecology

## 2011-03-28 ENCOUNTER — Other Ambulatory Visit (HOSPITAL_COMMUNITY)
Admission: RE | Admit: 2011-03-28 | Discharge: 2011-03-28 | Disposition: A | Discharge status: Home / Self Care | Payer: Medicare Other | Source: Ambulatory Visit | Attending: Obstetrics and Gynecology | Admitting: Obstetrics and Gynecology

## 2011-03-28 ENCOUNTER — Ambulatory Visit (INDEPENDENT_AMBULATORY_CARE_PROVIDER_SITE_OTHER): Payer: Medicare Other | Admitting: Obstetrics and Gynecology

## 2011-03-28 VITALS — BP 130/74 | Ht 64.0 in | Wt 191.0 lb

## 2011-03-28 DIAGNOSIS — Z124 Encounter for screening for malignant neoplasm of cervix: Secondary | ICD-10-CM | POA: Insufficient documentation

## 2011-03-28 DIAGNOSIS — N952 Postmenopausal atrophic vaginitis: Secondary | ICD-10-CM

## 2011-03-28 DIAGNOSIS — M810 Age-related osteoporosis without current pathological fracture: Secondary | ICD-10-CM

## 2011-03-28 DIAGNOSIS — A609 Anogenital herpesviral infection, unspecified: Secondary | ICD-10-CM

## 2011-03-28 DIAGNOSIS — A64 Unspecified sexually transmitted disease: Secondary | ICD-10-CM | POA: Insufficient documentation

## 2011-03-28 DIAGNOSIS — B009 Herpesviral infection, unspecified: Secondary | ICD-10-CM

## 2011-03-28 DIAGNOSIS — N898 Other specified noninflammatory disorders of vagina: Secondary | ICD-10-CM

## 2011-03-28 DIAGNOSIS — B373 Candidiasis of vulva and vagina: Secondary | ICD-10-CM

## 2011-03-28 MED ORDER — ACYCLOVIR 400 MG PO TABS: 400.0000 mg | ORAL_TABLET | Freq: Three times a day (TID) | ORAL | Status: DC

## 2011-03-28 MED ORDER — FLUCONAZOLE 150 MG PO TABS: 150.0000 mg | ORAL_TABLET | Freq: Once | ORAL | Status: AC

## 2011-03-28 NOTE — Progress Notes (Signed)
Subjective:     Patient ID: Kimberly Webb, female   DOB: [birth year], 77 y.o.   MRN: [id number]  HPIpatient came to see me today for further followup. 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 If she doesn't do 3 a day she gets recurrences so we have her on 3 a day. She remains on Fosamax for osteoporosis. She does not have it on bone density but has had 3 fragility fractures and in addition Dr. Charlann Boxer thought she had it based on his surgery when he did her knee replacement. She is currently getting it through her PCPs office. She had wanted to go on Reclast but  it was too expensive. She is also having trouble with vaginal dryness. All the commercial products were too expensive but she was interested when I told her about compounded estrogen cream. She's had no vaginal bleeding. She's had no pelvic pain. She is up-to-date on her mammograms. She's having vaginal itching and thinks she has a yeast infection. She seems to get every time she takes are penicillin for dental visits. She was wondering what else she might do to prevent it from happening.   Review of Systems  Constitutional:       Hypothyroidism  HENT: Negative.   Eyes: Negative.   Respiratory: Negative.   Cardiovascular:       Hyperlipidemia, do to valve requires SBE prophylaxis, hypertension  Gastrointestinal: Negative.   Genitourinary: Negative.   Musculoskeletal: Negative.        Arthritis  Skin: Negative.   Neurological: Negative.   Hematological: Negative.   Psychiatric/Behavioral: Negative.        Objective:   Physical ExamHEENT: Within normal limits. Kennon Portela present. Neck: No masses. Supraclavicular lymph nodes: Not enlarged. Breasts: Examined in both sitting and lying position. Symmetrical without skin changes or masses. Abdomen: Soft no masses guarding or rebound. No hernias. Pelvic: External within normal limits. BUS within normal limits. Vaginal examination shows poor estrogen effect, no cystocele enterocele or  rectocele.Wet prep positive for yeast Adnexa within normal limits. Rectovaginal confirmatory. Extremities within normal limits.      Assessment:     #1. Yeast vaginitis #2. Osteoporosis #3. Atrophic vaginitis #4. Herpes progenitalis    Plan:     Patient treated with Diflucan 150 mg daily for 4 days. Prescription given for 10 with refills. She goes to dentist  4 times a year and is required to take penicillin which is when she gets infections. We also discussed lactinex or oral rephresh for prevention. Continue Zovirax 400 mg 3 times a day. Start her on estradiol vaginal cream 0.02%, 1 applicatorful her vagina 3 times a week. Since she's treated for her osteoporosis through Dr. Quita Skye office suggested she see if Prolia would be cheaper than Reclast.she will call them and inquire.

## 2011-07-19 DIAGNOSIS — Z79899 Other long term (current) drug therapy: Secondary | ICD-10-CM | POA: Diagnosis not present

## 2011-07-19 DIAGNOSIS — E039 Hypothyroidism, unspecified: Secondary | ICD-10-CM | POA: Diagnosis not present

## 2011-07-19 DIAGNOSIS — M159 Polyosteoarthritis, unspecified: Secondary | ICD-10-CM | POA: Diagnosis not present

## 2011-07-19 DIAGNOSIS — M810 Age-related osteoporosis without current pathological fracture: Secondary | ICD-10-CM | POA: Diagnosis not present

## 2011-07-19 DIAGNOSIS — I10 Essential (primary) hypertension: Secondary | ICD-10-CM | POA: Diagnosis not present

## 2011-07-19 DIAGNOSIS — E782 Mixed hyperlipidemia: Secondary | ICD-10-CM | POA: Diagnosis not present

## 2011-10-08 DIAGNOSIS — L821 Other seborrheic keratosis: Secondary | ICD-10-CM | POA: Diagnosis not present

## 2011-10-08 DIAGNOSIS — B351 Tinea unguium: Secondary | ICD-10-CM | POA: Diagnosis not present

## 2011-12-17 ENCOUNTER — Other Ambulatory Visit: Payer: Self-pay | Admitting: Dermatology

## 2011-12-17 DIAGNOSIS — L821 Other seborrheic keratosis: Secondary | ICD-10-CM | POA: Diagnosis not present

## 2011-12-17 DIAGNOSIS — D485 Neoplasm of uncertain behavior of skin: Secondary | ICD-10-CM | POA: Diagnosis not present

## 2011-12-17 DIAGNOSIS — L578 Other skin changes due to chronic exposure to nonionizing radiation: Secondary | ICD-10-CM | POA: Diagnosis not present

## 2012-01-10 DIAGNOSIS — Z1231 Encounter for screening mammogram for malignant neoplasm of breast: Secondary | ICD-10-CM | POA: Diagnosis not present

## 2012-01-11 ENCOUNTER — Encounter: Payer: Self-pay | Admitting: Obstetrics and Gynecology

## 2012-01-16 ENCOUNTER — Other Ambulatory Visit (HOSPITAL_COMMUNITY): Payer: Self-pay | Admitting: Family Medicine

## 2012-01-16 DIAGNOSIS — M810 Age-related osteoporosis without current pathological fracture: Secondary | ICD-10-CM | POA: Diagnosis not present

## 2012-01-16 DIAGNOSIS — R0602 Shortness of breath: Secondary | ICD-10-CM | POA: Diagnosis not present

## 2012-01-16 DIAGNOSIS — M159 Polyosteoarthritis, unspecified: Secondary | ICD-10-CM | POA: Diagnosis not present

## 2012-01-16 DIAGNOSIS — E782 Mixed hyperlipidemia: Secondary | ICD-10-CM | POA: Diagnosis not present

## 2012-01-16 DIAGNOSIS — I10 Essential (primary) hypertension: Secondary | ICD-10-CM | POA: Diagnosis not present

## 2012-01-16 DIAGNOSIS — Z79899 Other long term (current) drug therapy: Secondary | ICD-10-CM | POA: Diagnosis not present

## 2012-01-16 DIAGNOSIS — E039 Hypothyroidism, unspecified: Secondary | ICD-10-CM | POA: Diagnosis not present

## 2012-01-16 DIAGNOSIS — H2510 Age-related nuclear cataract, unspecified eye: Secondary | ICD-10-CM | POA: Diagnosis not present

## 2012-01-23 ENCOUNTER — Ambulatory Visit (HOSPITAL_COMMUNITY)
Admission: RE | Admit: 2012-01-23 | Discharge: 2012-01-23 | Disposition: A | Discharge status: Home / Self Care | Payer: Medicare Other | Source: Ambulatory Visit | Attending: Family Medicine | Admitting: Family Medicine

## 2012-01-23 DIAGNOSIS — R0602 Shortness of breath: Secondary | ICD-10-CM | POA: Insufficient documentation

## 2012-01-24 DIAGNOSIS — H43819 Vitreous degeneration, unspecified eye: Secondary | ICD-10-CM | POA: Diagnosis not present

## 2012-01-24 DIAGNOSIS — H25019 Cortical age-related cataract, unspecified eye: Secondary | ICD-10-CM | POA: Diagnosis not present

## 2012-01-24 DIAGNOSIS — H2510 Age-related nuclear cataract, unspecified eye: Secondary | ICD-10-CM | POA: Diagnosis not present

## 2012-01-24 DIAGNOSIS — H52209 Unspecified astigmatism, unspecified eye: Secondary | ICD-10-CM | POA: Diagnosis not present

## 2012-02-18 DIAGNOSIS — M810 Age-related osteoporosis without current pathological fracture: Secondary | ICD-10-CM | POA: Diagnosis not present

## 2012-02-18 DIAGNOSIS — Z23 Encounter for immunization: Secondary | ICD-10-CM | POA: Diagnosis not present

## 2012-02-19 ENCOUNTER — Other Ambulatory Visit: Payer: Self-pay | Admitting: Obstetrics and Gynecology

## 2012-02-19 ENCOUNTER — Ambulatory Visit (INDEPENDENT_AMBULATORY_CARE_PROVIDER_SITE_OTHER): Payer: Medicare Other

## 2012-02-19 DIAGNOSIS — M810 Age-related osteoporosis without current pathological fracture: Secondary | ICD-10-CM

## 2012-02-19 DIAGNOSIS — M8580 Other specified disorders of bone density and structure, unspecified site: Secondary | ICD-10-CM

## 2012-03-14 DIAGNOSIS — R05 Cough: Secondary | ICD-10-CM | POA: Diagnosis not present

## 2012-03-14 DIAGNOSIS — J019 Acute sinusitis, unspecified: Secondary | ICD-10-CM | POA: Diagnosis not present

## 2012-03-14 DIAGNOSIS — R059 Cough, unspecified: Secondary | ICD-10-CM | POA: Diagnosis not present

## 2012-03-31 ENCOUNTER — Ambulatory Visit (INDEPENDENT_AMBULATORY_CARE_PROVIDER_SITE_OTHER): Payer: Medicare Other | Admitting: Obstetrics and Gynecology

## 2012-03-31 ENCOUNTER — Encounter: Payer: Self-pay | Admitting: Obstetrics and Gynecology

## 2012-03-31 VITALS — BP 136/76 | Ht 65.0 in | Wt 191.0 lb

## 2012-03-31 DIAGNOSIS — R351 Nocturia: Secondary | ICD-10-CM | POA: Diagnosis not present

## 2012-03-31 DIAGNOSIS — B373 Candidiasis of vulva and vagina: Secondary | ICD-10-CM

## 2012-03-31 DIAGNOSIS — N952 Postmenopausal atrophic vaginitis: Secondary | ICD-10-CM

## 2012-03-31 DIAGNOSIS — M899 Disorder of bone, unspecified: Secondary | ICD-10-CM

## 2012-03-31 DIAGNOSIS — M949 Disorder of cartilage, unspecified: Secondary | ICD-10-CM

## 2012-03-31 DIAGNOSIS — B009 Herpesviral infection, unspecified: Secondary | ICD-10-CM | POA: Diagnosis not present

## 2012-03-31 DIAGNOSIS — M8580 Other specified disorders of bone density and structure, unspecified site: Secondary | ICD-10-CM

## 2012-03-31 MED ORDER — FLUCONAZOLE 150 MG PO TABS: 150.0000 mg | ORAL_TABLET | Freq: Once | ORAL | Status: DC

## 2012-03-31 MED ORDER — ACYCLOVIR 400 MG PO TABS: 400.0000 mg | ORAL_TABLET | Freq: Three times a day (TID) | ORAL | Status: DC

## 2012-03-31 NOTE — Progress Notes (Signed)
Patient came to see me today for further followup. In 2004 I did a vaginal hysterectomy, bilateral salpingo-oophorectomy, anterior and posterior repair for the patient for uterine prolapse, cystocele, and rectocele. She is done well since surgery without any reoccurrences. We are treating her for HSV with Zovirax 400  mg 3 times a day. If she takes less she gets recurrences. On that dose she does well. She is having no vaginal bleeding. She is having no pelvic pain. She gets recurrent yeast infections which responded well to Diflucan. She has atrophic vaginitis and we have her on estradiol cream 0.02% 3 times weekly. She has never had an abnormal Pap smear. Her last Pap smear was 2012. She has had low bone mass and was started on Fosamax last year. She would be forgetful in taking it and Dr. Clovis Riley switched  her to Prolia this  September. She is up-to-date on mammograms. Her last bone density was this fall. It still shows osteopenia but significant improvement. She has nocturia x2. She has no urgency of urination, dysuria, or incontinence.  ROS: 12 system  review done. Pertinent positives above. Other positives include hyperlipidemia, osteoarthritis, And hypothyroidism.  HEENT: Within normal limits.Kennon Portela present. Neck: No masses. Supraclavicular lymph nodes: Not enlarged. Breasts: Examined in both sitting and lying position. Symmetrical without skin changes or masses. Abdomen: Soft no masses guarding or rebound. No hernias. Pelvic: External within normal limits. BUS within normal limits. Vaginal examination shows good estrogen effect, no cystocele enterocele or rectocele. Cervix and uterus absent. Adnexa within normal limits. Rectovaginal confirmatory. Extremities within normal limits.  Assessment: #1. Atrophic vaginitis #2. HSV #3. Yeast vaginitis #4. Nocturia #5. Osteopenia  Plan: Continue estradiol vaginal cream 0.02% in vagina 3 times a week. Continue Zovirax as above. Continue yearly  mammograms. Prolia treatment as per Dr. Clovis Riley. Prescriptions for Diflucan written.

## 2012-03-31 NOTE — Patient Instructions (Signed)
Continue yearly mammograms 

## 2012-04-02 ENCOUNTER — Other Ambulatory Visit: Payer: Self-pay | Admitting: Obstetrics and Gynecology

## 2012-04-02 DIAGNOSIS — H2510 Age-related nuclear cataract, unspecified eye: Secondary | ICD-10-CM | POA: Diagnosis not present

## 2012-04-02 DIAGNOSIS — H2589 Other age-related cataract: Secondary | ICD-10-CM | POA: Diagnosis not present

## 2012-04-02 DIAGNOSIS — H25019 Cortical age-related cataract, unspecified eye: Secondary | ICD-10-CM | POA: Diagnosis not present

## 2012-04-16 DIAGNOSIS — H2510 Age-related nuclear cataract, unspecified eye: Secondary | ICD-10-CM | POA: Diagnosis not present

## 2012-04-16 DIAGNOSIS — H2589 Other age-related cataract: Secondary | ICD-10-CM | POA: Diagnosis not present

## 2012-04-16 DIAGNOSIS — H25019 Cortical age-related cataract, unspecified eye: Secondary | ICD-10-CM | POA: Diagnosis not present

## 2012-08-29 DIAGNOSIS — M159 Polyosteoarthritis, unspecified: Secondary | ICD-10-CM | POA: Diagnosis not present

## 2012-08-29 DIAGNOSIS — M810 Age-related osteoporosis without current pathological fracture: Secondary | ICD-10-CM | POA: Diagnosis not present

## 2012-08-29 DIAGNOSIS — I10 Essential (primary) hypertension: Secondary | ICD-10-CM | POA: Diagnosis not present

## 2012-08-29 DIAGNOSIS — Z79899 Other long term (current) drug therapy: Secondary | ICD-10-CM | POA: Diagnosis not present

## 2012-08-29 DIAGNOSIS — R42 Dizziness and giddiness: Secondary | ICD-10-CM | POA: Diagnosis not present

## 2012-08-29 DIAGNOSIS — E782 Mixed hyperlipidemia: Secondary | ICD-10-CM | POA: Diagnosis not present

## 2012-08-29 DIAGNOSIS — E039 Hypothyroidism, unspecified: Secondary | ICD-10-CM | POA: Diagnosis not present

## 2012-09-19 ENCOUNTER — Emergency Department (HOSPITAL_BASED_OUTPATIENT_CLINIC_OR_DEPARTMENT_OTHER)
Admission: EM | Admit: 2012-09-19 | Discharge: 2012-09-19 | Disposition: A | Discharge status: Home / Self Care | Payer: Medicare Other | Attending: Emergency Medicine | Admitting: Emergency Medicine

## 2012-09-19 ENCOUNTER — Encounter (HOSPITAL_BASED_OUTPATIENT_CLINIC_OR_DEPARTMENT_OTHER): Payer: Self-pay

## 2012-09-19 ENCOUNTER — Emergency Department (HOSPITAL_BASED_OUTPATIENT_CLINIC_OR_DEPARTMENT_OTHER): Payer: Medicare Other

## 2012-09-19 DIAGNOSIS — M25559 Pain in unspecified hip: Secondary | ICD-10-CM | POA: Diagnosis not present

## 2012-09-19 DIAGNOSIS — Z8619 Personal history of other infectious and parasitic diseases: Secondary | ICD-10-CM | POA: Insufficient documentation

## 2012-09-19 DIAGNOSIS — Z87891 Personal history of nicotine dependence: Secondary | ICD-10-CM | POA: Diagnosis not present

## 2012-09-19 DIAGNOSIS — M549 Dorsalgia, unspecified: Secondary | ICD-10-CM | POA: Insufficient documentation

## 2012-09-19 DIAGNOSIS — E7800 Pure hypercholesterolemia, unspecified: Secondary | ICD-10-CM | POA: Diagnosis not present

## 2012-09-19 DIAGNOSIS — M169 Osteoarthritis of hip, unspecified: Secondary | ICD-10-CM | POA: Diagnosis not present

## 2012-09-19 DIAGNOSIS — Z7982 Long term (current) use of aspirin: Secondary | ICD-10-CM | POA: Diagnosis not present

## 2012-09-19 DIAGNOSIS — Z87448 Personal history of other diseases of urinary system: Secondary | ICD-10-CM | POA: Insufficient documentation

## 2012-09-19 DIAGNOSIS — M129 Arthropathy, unspecified: Secondary | ICD-10-CM | POA: Diagnosis not present

## 2012-09-19 DIAGNOSIS — M810 Age-related osteoporosis without current pathological fracture: Secondary | ICD-10-CM | POA: Diagnosis not present

## 2012-09-19 DIAGNOSIS — Z792 Long term (current) use of antibiotics: Secondary | ICD-10-CM | POA: Diagnosis not present

## 2012-09-19 DIAGNOSIS — Z79899 Other long term (current) drug therapy: Secondary | ICD-10-CM | POA: Diagnosis not present

## 2012-09-19 DIAGNOSIS — E039 Hypothyroidism, unspecified: Secondary | ICD-10-CM | POA: Insufficient documentation

## 2012-09-19 DIAGNOSIS — M25552 Pain in left hip: Secondary | ICD-10-CM

## 2012-09-19 MED ORDER — HYDROCODONE-ACETAMINOPHEN 5-325 MG PO TABS: 2.0000 | ORAL_TABLET | Freq: Four times a day (QID) | ORAL | Status: DC | PRN

## 2012-09-19 NOTE — ED Provider Notes (Signed)
History     CSN: [id number]  Arrival date & time 09/19/12  1207   First MD Initiated Contact with Patient 09/19/12 1309      Chief Complaint  Patient presents with  . Back Pain  . Groin Pain    (Consider location/radiation/quality/duration/timing/severity/associated sxs/prior treatment) HPI 79 year old female complains of intermittent well localized nonradiating sharp stabbing left anterior hip pain with certain position changes especially when she tries to get out of bed, sometimes it hurts when she is walking sometimes it doesn't, she is no back pain despite the triage documented complaint, she is no, pain chest pain shortness of breath vomiting diarrhea dysuria radiation down her leg weakness numbness swelling or color change to her leg or other concerns. Her pain is gone now is reproducible with certain position changes or lasting a few minutes at a time. There is no treatment prior to arrival. Past Medical History  Diagnosis Date  . Fibroid   . Rectocele   . Cystocele   . Uterine prolapse   . Paratubal cyst   . Atrophic vaginitis   . Osteoporosis   . Hypercholesteremia   . Arthritis   . Hypothyroidism   . STD (sexually transmitted disease)     HSV    Past Surgical History  Procedure Laterality Date  . Replacement total knee  2011    LEFT X 2  . Broken femur    . Broken ankle    . Thyroidectomy, partial    . Vaginal hysterectomy  2004    VAG HYST, BSO, A&P REPAIR  . Oophorectomy      BSO WITH VAG HYST IN 2004  . Hernia repair  2009    Family History  Problem Relation Age of Onset  . Heart disease Mother   . Hypertension Father   . Hypertension Sister   . Leukemia Sister   . Heart disease Brother   . Multiple sclerosis Brother   . Parkinsonism Brother     History  Substance Use Topics  . Smoking status: Former Games developer  . Smokeless tobacco: Not on file  . Alcohol Use: No    OB History   Grav Para Term Preterm Abortions TAB SAB Ect Mult Living   2 2  2       2       Review of Systems 10 Systems reviewed and are negative for acute change except as noted in the HPI. Allergies  Review of patient's allergies indicates no known allergies.  Home Medications   Current Outpatient Rx  Name  Route  Sig  Dispense  Refill  . acyclovir (ZOVIRAX) 400 MG tablet   Oral   Take 1 tablet (400 mg total) by mouth 3 (three) times daily.   270 tablet   4     KEEP October 2012 APPOINTMENT FOR FURTHER REFILLS   . acyclovir (ZOVIRAX) 400 MG tablet      TAKE 1 TABLET BY MOUTH THREE TIMES DAILY   270 tablet   1   . amLODipine (NORVASC) 5 MG tablet   Oral   Take 5 mg by mouth daily.           [name] Kitchen amoxicillin (AMOXIL) 500 MG capsule   Oral   Take 500 mg by mouth 4 (four) times daily as needed. Take with dental procedures         . aspirin 81 MG tablet   Oral   Take 81 mg by mouth daily.           [name] Kitchen  Calcium Carbonate-Vitamin D (CALCIUM + D PO)   Oral   Take by mouth.           . Cholecalciferol (VITAMIN D PO)   Oral   Take by mouth.           . denosumab (PROLIA) 60 MG/ML SOLN injection   Subcutaneous   Inject 60 mg into the skin every 6 (six) months. Administer in upper arm, thigh, or abdomen         . ESTRADIOL VA   Vaginal   Place vaginally.         . fluconazole (DIFLUCAN) 150 MG tablet   Oral   Take 1 tablet (150 mg total) by mouth once.   10 tablet   1   . HYDROcodone-acetaminophen (NORCO) 5-325 MG per tablet   Oral   Take 2 tablets by mouth every 6 (six) hours as needed for pain.   20 tablet   0   . levothyroxine (SYNTHROID, LEVOTHROID) 100 MCG tablet   Oral   Take 100 mcg by mouth daily.           . Multiple Vitamin (MULTIVITAMIN) capsule   Oral   Take 1 capsule by mouth daily.           . quinapril (ACCUPRIL) 40 MG tablet   Oral   Take 40 mg by mouth at bedtime.           . simvastatin (ZOCOR) 40 MG tablet   Oral   Take 40 mg by mouth at bedtime.           . SPIRONOLACTONE-HCTZ PO    Oral   Take by mouth.             BP 149/85  Pulse 81  Temp(Src) 98.3 F (36.8 C) (Oral)  SpO2 96%  Physical Exam  Nursing note and vitals reviewed. Constitutional:  Awake, alert, nontoxic appearance with baseline speech.  HENT:  Head: Atraumatic.  Eyes: Pupils are equal, round, and reactive to light. Right eye exhibits no discharge. Left eye exhibits no discharge.  Neck: Neck supple.  Cardiovascular: Normal rate and regular rhythm.   No murmur heard. Pulmonary/Chest: Effort normal and breath sounds normal. No respiratory distress. She has no wheezes. She has no rales. She exhibits no tenderness.  Abdominal: Soft. Bowel sounds are normal. She exhibits no mass. There is no tenderness. There is no rebound.  Musculoskeletal: She exhibits no edema and no tenderness.       Thoracic back: She exhibits no tenderness.       Lumbar back: She exhibits no tenderness.  Bilateral lower extremities non tender without new rashes or color change, baseline ROM with intact DP pulses, CR<2 secs all digits bilaterally, sensation baseline light touch bilaterally for pt, DTR's symmetric and intact bilaterally KJ / AJ, motor symmetric bilateral 5 / 5 hip flexion, quadriceps, hamstrings, EHL, foot dorsiflexion, foot plantarflexion, gait normal without ataxia. Back is nontender. Reproducible intermittent left hip anterior pain with certain position changes in the ED without tenderness. Back is entirely nontender.  Neurological: She is alert.  Mental status baseline for patient.  Upper extremity motor strength and sensation intact and symmetric bilaterally.  Skin: No rash noted.  Psychiatric: She has a normal mood and affect.    ED Course  Procedures (including critical care time) Patient / Family / Caregiver informed of clinical course, understand medical decision-making process, and agree with plan. Labs Reviewed - No data to display Dg Hip  Complete Left  09/19/2012  *RADIOLOGY REPORT*  Clinical  Data: Left hip pain.  No known injury.  LEFT HIP - COMPLETE 2+ VIEW  Comparison: None.  Findings: Mild symmetric degenerative changes in the hips bilaterally.  Degenerative changes in the visualized lower lumbar spine. No acute bony abnormality.  Specifically, no fracture, subluxation, or dislocation.  Soft tissues are intact.  IMPRESSION: Mild degenerative changes. No acute bony abnormality.   Original Report Authenticated By: Charlett Nose, M.D.      1. Left hip pain       MDM  I doubt any other [facility] precluding discharge at this time including, but not necessarily limited to the following:SBI, radiculopathy.        Hurman Horn, MD 09/19/12 2021

## 2012-09-19 NOTE — ED Notes (Signed)
Pt reports back pain radiating to left hip since Tuesday.

## 2012-10-06 DIAGNOSIS — T84019A Broken internal joint prosthesis, unspecified site, initial encounter: Secondary | ICD-10-CM | POA: Diagnosis not present

## 2012-10-17 ENCOUNTER — Encounter: Payer: Self-pay | Admitting: Women's Health

## 2012-10-17 ENCOUNTER — Ambulatory Visit (INDEPENDENT_AMBULATORY_CARE_PROVIDER_SITE_OTHER): Payer: Medicare Other | Admitting: Women's Health

## 2012-10-17 DIAGNOSIS — N898 Other specified noninflammatory disorders of vagina: Secondary | ICD-10-CM | POA: Diagnosis not present

## 2012-10-17 DIAGNOSIS — R300 Dysuria: Secondary | ICD-10-CM

## 2012-10-17 LAB — WET PREP FOR TRICH, YEAST, CLUE
Trich, Wet Prep: NONE SEEN
Yeast Wet Prep HPF POC: NONE SEEN

## 2012-10-17 LAB — URINALYSIS W MICROSCOPIC + REFLEX CULTURE
Glucose, UA: NEGATIVE mg/dL
Hgb urine dipstick: NEGATIVE
Nitrite: NEGATIVE
Urobilinogen, UA: 0.2 mg/dL (ref 0.0–1.0)

## 2012-10-17 MED ORDER — NYSTATIN-TRIAMCINOLONE 100000-0.1 UNIT/GM-% EX OINT: TOPICAL_OINTMENT | Freq: Two times a day (BID) | CUTANEOUS | Status: DC

## 2012-10-17 NOTE — Progress Notes (Signed)
Patient ID: Kimberly Webb, female   DOB: 1933-03-05, 79 y.o.   MRN: [id number] Presents with the complaint of vaginal discharge with irritation, burning sensation, denies any itching. States irritation has been present for approximately one month. Denies pain or burning at end of stream with urination or fever. TVH with BSO and A&P repair.  Exam: Appears well, external genitalia extremely erythematous at introitus, speculum exam scant discharge minimal erythema. Wet prep negative, UA trace leukocytes, 3-6 WBCs many bacteria.  External vaginal  Irritation  Plan: Urine culture pending. Mycolog ointment externally twice daily as needed, prescription, proper use given and reviewed. Instructed to call if no relief of symptoms.

## 2012-10-19 LAB — URINE CULTURE: Colony Count: >=100000

## 2012-10-21 ENCOUNTER — Other Ambulatory Visit: Payer: Self-pay | Admitting: *Deleted

## 2012-10-21 DIAGNOSIS — N390 Urinary tract infection, site not specified: Secondary | ICD-10-CM

## 2012-10-21 MED ORDER — CIPROFLOXACIN HCL 250 MG PO TABS: 250.0000 mg | ORAL_TABLET | Freq: Two times a day (BID) | ORAL | Status: DC

## 2012-10-31 ENCOUNTER — Other Ambulatory Visit: Payer: Self-pay | Admitting: Women's Health

## 2012-10-31 ENCOUNTER — Other Ambulatory Visit: Payer: Medicare Other

## 2012-10-31 DIAGNOSIS — N390 Urinary tract infection, site not specified: Secondary | ICD-10-CM | POA: Diagnosis not present

## 2012-11-01 LAB — URINALYSIS W MICROSCOPIC + REFLEX CULTURE
Bacteria, UA: NONE SEEN
Crystals: NONE SEEN
Nitrite: NEGATIVE
Specific Gravity, Urine: 1.027 (ref 1.005–1.030)
Urobilinogen, UA: 0.2 mg/dL (ref 0.0–1.0)
pH: 5 (ref 5.0–8.0)

## 2013-01-09 DIAGNOSIS — Z1231 Encounter for screening mammogram for malignant neoplasm of breast: Secondary | ICD-10-CM | POA: Diagnosis not present

## 2013-01-12 ENCOUNTER — Encounter: Payer: Self-pay | Admitting: Women's Health

## 2013-02-05 DIAGNOSIS — Z96659 Presence of unspecified artificial knee joint: Secondary | ICD-10-CM | POA: Diagnosis not present

## 2013-03-02 DIAGNOSIS — I10 Essential (primary) hypertension: Secondary | ICD-10-CM | POA: Diagnosis not present

## 2013-03-02 DIAGNOSIS — M810 Age-related osteoporosis without current pathological fracture: Secondary | ICD-10-CM | POA: Diagnosis not present

## 2013-03-02 DIAGNOSIS — E782 Mixed hyperlipidemia: Secondary | ICD-10-CM | POA: Diagnosis not present

## 2013-03-02 DIAGNOSIS — Z79899 Other long term (current) drug therapy: Secondary | ICD-10-CM | POA: Diagnosis not present

## 2013-03-02 DIAGNOSIS — E039 Hypothyroidism, unspecified: Secondary | ICD-10-CM | POA: Diagnosis not present

## 2013-03-02 DIAGNOSIS — M159 Polyosteoarthritis, unspecified: Secondary | ICD-10-CM | POA: Diagnosis not present

## 2013-03-11 DIAGNOSIS — Z23 Encounter for immunization: Secondary | ICD-10-CM | POA: Diagnosis not present

## 2013-04-01 ENCOUNTER — Encounter: Payer: Self-pay | Admitting: Gynecology

## 2013-04-01 ENCOUNTER — Ambulatory Visit (INDEPENDENT_AMBULATORY_CARE_PROVIDER_SITE_OTHER): Payer: Medicare Other | Admitting: Gynecology

## 2013-04-01 VITALS — BP 122/80 | Ht 63.25 in | Wt 188.0 lb

## 2013-04-01 DIAGNOSIS — M899 Disorder of bone, unspecified: Secondary | ICD-10-CM

## 2013-04-01 DIAGNOSIS — M8580 Other specified disorders of bone density and structure, unspecified site: Secondary | ICD-10-CM

## 2013-04-01 DIAGNOSIS — D239 Other benign neoplasm of skin, unspecified: Secondary | ICD-10-CM | POA: Diagnosis not present

## 2013-04-01 DIAGNOSIS — N952 Postmenopausal atrophic vaginitis: Secondary | ICD-10-CM

## 2013-04-01 DIAGNOSIS — A6000 Herpesviral infection of urogenital system, unspecified: Secondary | ICD-10-CM

## 2013-04-01 DIAGNOSIS — D229 Melanocytic nevi, unspecified: Secondary | ICD-10-CM

## 2013-04-01 MED ORDER — FLUCONAZOLE 150 MG PO TABS: 150.0000 mg | ORAL_TABLET | Freq: Once | ORAL | Status: DC

## 2013-04-01 MED ORDER — ACYCLOVIR 400 MG PO TABS: 400.0000 mg | ORAL_TABLET | Freq: Three times a day (TID) | ORAL | Status: DC

## 2013-04-01 NOTE — Progress Notes (Signed)
Kimberly Webb [birth year] [id number]   History:    79 y.o.  for GYN followup exam. Patient has history in the past of recurrent yeast infections as well as recurrent HSV. Review of patient's records indicated in 2004 she had a transvaginal hysterectomy with bilateral salpingo-oophorectomy along with anterior and posterior repair as a result of uterine prolapse, cystocele, and rectocele. She is currently taking Zovirax 400 mg 3 times a day. Patient discontinued the estradiol cream 0.02% that she was applied 3 times weekly because she stated that it caused her to have hot flashes. Patient prior to her hysterectomy never had any history of abnormal Pap smears. Her last Pap smear was in 2012. 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 Her last bone density study showed significant improvement in her osteopenia in 2013. Patient's last colonoscopy was less than 10 years ago and she states that she has never had an abnormal colonoscopy. Her mammogram was this year which was normal.   Past medical history,surgical history, family history and social history were all reviewed and documented in the EPIC chart.  Gynecologic History No LMP recorded. Patient has had a hysterectomy. Contraception: status post hysterectomy Last Pap: 2011. Results were: normal Last mammogram: 2014. Results were: normal but dense  Obstetric History OB History  Gravida Para Term Preterm AB SAB TAB Ectopic Multiple Living  2 2 2       2     # Outcome Date GA Lbr Len/2nd Weight Sex Delivery Anes PTL Lv  2 TRM           1 TRM                ROS: A ROS was performed and pertinent positives and negatives are included in the history.  GENERAL: No fevers or chills. HEENT: No change in vision, no earache, sore throat or sinus congestion. NECK: No pain or stiffness. CARDIOVASCULAR: No  chest pain or pressure. No palpitations. PULMONARY: No shortness of breath, cough or wheeze. GASTROINTESTINAL: No abdominal pain, nausea, vomiting or diarrhea, melena or bright red blood per rectum. GENITOURINARY: No urinary frequency, urgency, hesitancy or dysuria. MUSCULOSKELETAL: No joint or muscle pain, no back pain, no recent trauma. DERMATOLOGIC: No rash, no itching, no lesions. ENDOCRINE: No polyuria, polydipsia, no heat or cold intolerance. No recent change in weight. HEMATOLOGICAL: No anemia or easy bruising or bleeding. NEUROLOGIC: No headache, seizures, numbness, tingling or weakness. PSYCHIATRIC: No depression, no loss of interest in normal activity or change in sleep pattern.     Exam: chaperone present  BP 122/80  Ht 5' 3.25" (1.607 m)  Wt 188 lb (85.276 kg)  BMI 33.02 kg/m2  Body mass index is 33.02 kg/(m^2).  General appearance : Well developed well nourished female. No acute distress HEENT: Neck supple, trachea midline, no carotid bruits, no thyroidmegaly Lungs: Clear to auscultation, no rhonchi or wheezes, or rib retractions  Heart: Regular rate and rhythm, no murmurs or gallops Breast:Examined in sitting and supine position were symmetrical in appearance, no palpable masses or tenderness,  no skin retraction, no nipple inversion, no nipple discharge, no skin discoloration, no axillary or supraclavicular lymphadenopathy Abdomen: no palpable masses or tenderness, no rebound or guarding Extremities: no edema or skin discoloration or tenderness  Pelvic:  Bartholin, Urethra, Skene Glands: Within normal limits  Vagina: No gross lesions or discharge, vaginal atrophy  Cervix: absence  Uterus  Absence  Adnexa  Without masses or tenderness  Anus and perineum  normal   Rectovaginal  normal sphincter tone without palpated masses or tenderness             Hemoccult PCP provides     Assessment/Plan:  79 y.o. female who has had issues in the past with vaginitis. No  abnormality was noted today. She was prescribed Diflucan 150 mg to take one by mouth when necessary with 2 refills. Pap smear was not done today and according to the new guidelines. Patient scheduled for followup bone density study next year. Her PCP we'll continue to monitor her Prolia for which she is on because of her 3 fertility fractures and her age. This is her second year on the medication. Her flu vaccine and shingles vaccine are up-to-date. On examination she was noted to have several moles throughout her skin but the one that caught my attention was inferior to the left breast which was pigmented dark and raised. She was followed by Dr. Londell Moh dermatologist who has recently retired and I have asked her to contact his office to be seen by one of his partners for further evaluation.  Note: This dictation was prepared with  Dragon/digital dictation along withSmart phrase technology. Any transcriptional errors that result from this process are unintentional.   Ok Edwards MD, 2:39 PM 04/01/2013

## 2013-04-28 ENCOUNTER — Other Ambulatory Visit: Payer: Self-pay | Admitting: Dermatology

## 2013-04-28 DIAGNOSIS — C44519 Basal cell carcinoma of skin of other part of trunk: Secondary | ICD-10-CM | POA: Diagnosis not present

## 2013-04-28 DIAGNOSIS — L821 Other seborrheic keratosis: Secondary | ICD-10-CM | POA: Diagnosis not present

## 2013-04-28 DIAGNOSIS — D1801 Hemangioma of skin and subcutaneous tissue: Secondary | ICD-10-CM | POA: Diagnosis not present

## 2013-04-28 DIAGNOSIS — L570 Actinic keratosis: Secondary | ICD-10-CM | POA: Diagnosis not present

## 2013-04-28 DIAGNOSIS — C44711 Basal cell carcinoma of skin of unspecified lower limb, including hip: Secondary | ICD-10-CM | POA: Diagnosis not present

## 2013-04-28 DIAGNOSIS — C44319 Basal cell carcinoma of skin of other parts of face: Secondary | ICD-10-CM | POA: Diagnosis not present

## 2013-05-12 ENCOUNTER — Other Ambulatory Visit: Payer: Self-pay | Admitting: Dermatology

## 2013-05-12 DIAGNOSIS — C44319 Basal cell carcinoma of skin of other parts of face: Secondary | ICD-10-CM | POA: Diagnosis not present

## 2013-05-12 DIAGNOSIS — Z85828 Personal history of other malignant neoplasm of skin: Secondary | ICD-10-CM | POA: Diagnosis not present

## 2013-06-18 DIAGNOSIS — H02839 Dermatochalasis of unspecified eye, unspecified eyelid: Secondary | ICD-10-CM | POA: Diagnosis not present

## 2013-06-18 DIAGNOSIS — Z961 Presence of intraocular lens: Secondary | ICD-10-CM | POA: Diagnosis not present

## 2013-06-18 DIAGNOSIS — H2640 Unspecified secondary cataract: Secondary | ICD-10-CM | POA: Diagnosis not present

## 2013-06-18 DIAGNOSIS — H43819 Vitreous degeneration, unspecified eye: Secondary | ICD-10-CM | POA: Diagnosis not present

## 2013-07-07 ENCOUNTER — Telehealth: Payer: Self-pay

## 2013-07-07 DIAGNOSIS — M79609 Pain in unspecified limb: Secondary | ICD-10-CM | POA: Diagnosis not present

## 2013-07-07 DIAGNOSIS — Z96659 Presence of unspecified artificial knee joint: Secondary | ICD-10-CM | POA: Diagnosis not present

## 2013-07-07 MED ORDER — ACYCLOVIR 400 MG PO TABS: ORAL_TABLET | ORAL | Status: DC

## 2013-07-07 NOTE — Telephone Encounter (Signed)
Patient called. Her pharmacy has changed and she needs her Acyclovir re-sent to her new pharmacy. Rx sent to new pharmacy.

## 2013-07-24 DIAGNOSIS — M898X9 Other specified disorders of bone, unspecified site: Secondary | ICD-10-CM | POA: Diagnosis not present

## 2013-07-24 DIAGNOSIS — L84 Corns and callosities: Secondary | ICD-10-CM | POA: Diagnosis not present

## 2013-07-24 DIAGNOSIS — M8540 Solitary bone cyst, unspecified site: Secondary | ICD-10-CM | POA: Diagnosis not present

## 2013-08-07 DIAGNOSIS — M79609 Pain in unspecified limb: Secondary | ICD-10-CM | POA: Diagnosis not present

## 2013-08-07 DIAGNOSIS — M25519 Pain in unspecified shoulder: Secondary | ICD-10-CM | POA: Diagnosis not present

## 2013-08-07 DIAGNOSIS — Z4789 Encounter for other orthopedic aftercare: Secondary | ICD-10-CM | POA: Diagnosis not present

## 2013-09-01 DIAGNOSIS — M159 Polyosteoarthritis, unspecified: Secondary | ICD-10-CM | POA: Diagnosis not present

## 2013-09-01 DIAGNOSIS — M810 Age-related osteoporosis without current pathological fracture: Secondary | ICD-10-CM | POA: Diagnosis not present

## 2013-09-01 DIAGNOSIS — I10 Essential (primary) hypertension: Secondary | ICD-10-CM | POA: Diagnosis not present

## 2013-09-01 DIAGNOSIS — E039 Hypothyroidism, unspecified: Secondary | ICD-10-CM | POA: Diagnosis not present

## 2013-09-01 DIAGNOSIS — E782 Mixed hyperlipidemia: Secondary | ICD-10-CM | POA: Diagnosis not present

## 2013-09-01 DIAGNOSIS — Z23 Encounter for immunization: Secondary | ICD-10-CM | POA: Diagnosis not present

## 2013-10-13 DIAGNOSIS — H66009 Acute suppurative otitis media without spontaneous rupture of ear drum, unspecified ear: Secondary | ICD-10-CM | POA: Diagnosis not present

## 2013-12-11 DIAGNOSIS — H60399 Other infective otitis externa, unspecified ear: Secondary | ICD-10-CM | POA: Diagnosis not present

## 2013-12-17 DIAGNOSIS — H6120 Impacted cerumen, unspecified ear: Secondary | ICD-10-CM | POA: Diagnosis not present

## 2013-12-21 ENCOUNTER — Telehealth: Payer: Self-pay | Admitting: *Deleted

## 2013-12-21 DIAGNOSIS — M8580 Other specified disorders of bone density and structure, unspecified site: Secondary | ICD-10-CM

## 2013-12-21 NOTE — Telephone Encounter (Signed)
Pt called requesting dexa order per note on OV 04/01/13" Patient scheduled for followup bone density study next year" order placed.

## 2014-01-11 DIAGNOSIS — Z1231 Encounter for screening mammogram for malignant neoplasm of breast: Secondary | ICD-10-CM | POA: Diagnosis not present

## 2014-01-14 ENCOUNTER — Encounter: Payer: Self-pay | Admitting: Gynecology

## 2014-02-22 ENCOUNTER — Ambulatory Visit (INDEPENDENT_AMBULATORY_CARE_PROVIDER_SITE_OTHER): Payer: Medicare Other

## 2014-02-22 ENCOUNTER — Ambulatory Visit (INDEPENDENT_AMBULATORY_CARE_PROVIDER_SITE_OTHER): Payer: Medicare Other | Admitting: Anesthesiology

## 2014-02-22 DIAGNOSIS — Z23 Encounter for immunization: Secondary | ICD-10-CM

## 2014-02-22 DIAGNOSIS — M899 Disorder of bone, unspecified: Secondary | ICD-10-CM

## 2014-02-22 DIAGNOSIS — M8580 Other specified disorders of bone density and structure, unspecified site: Secondary | ICD-10-CM

## 2014-02-22 DIAGNOSIS — M949 Disorder of cartilage, unspecified: Secondary | ICD-10-CM

## 2014-03-18 DIAGNOSIS — M150 Primary generalized (osteo)arthritis: Secondary | ICD-10-CM | POA: Diagnosis not present

## 2014-03-18 DIAGNOSIS — I10 Essential (primary) hypertension: Secondary | ICD-10-CM | POA: Diagnosis not present

## 2014-03-18 DIAGNOSIS — Z23 Encounter for immunization: Secondary | ICD-10-CM | POA: Diagnosis not present

## 2014-03-18 DIAGNOSIS — E782 Mixed hyperlipidemia: Secondary | ICD-10-CM | POA: Diagnosis not present

## 2014-03-18 DIAGNOSIS — M810 Age-related osteoporosis without current pathological fracture: Secondary | ICD-10-CM | POA: Diagnosis not present

## 2014-03-18 DIAGNOSIS — E039 Hypothyroidism, unspecified: Secondary | ICD-10-CM | POA: Diagnosis not present

## 2014-04-02 ENCOUNTER — Ambulatory Visit (INDEPENDENT_AMBULATORY_CARE_PROVIDER_SITE_OTHER): Payer: Medicare Other | Admitting: Gynecology

## 2014-04-02 ENCOUNTER — Encounter: Payer: Self-pay | Admitting: Gynecology

## 2014-04-02 VITALS — BP 134/86 | Ht 64.0 in | Wt 185.0 lb

## 2014-04-02 DIAGNOSIS — N952 Postmenopausal atrophic vaginitis: Secondary | ICD-10-CM

## 2014-04-02 DIAGNOSIS — Z8619 Personal history of other infectious and parasitic diseases: Secondary | ICD-10-CM

## 2014-04-02 DIAGNOSIS — Z8739 Personal history of other diseases of the musculoskeletal system and connective tissue: Secondary | ICD-10-CM | POA: Diagnosis not present

## 2014-04-02 NOTE — Progress Notes (Signed)
Kimberly Webb [birth year] [id number]   History:    80 y.o.  for GYN exam and follow-up.Patient has history of 3 fragility fractures in the past and had been on Fosamax and Evista and the past and due to patient's forgetfulness her primary physician started her on Prolia in 2012. Her last bone density study showed significant improvement in her osteopenia in 2013.Review of patient's records indicated in 2004 she had a transvaginal hysterectomy with bilateral salpingo-oophorectomy along with anterior and posterior repair as a result of uterine prolapse, cystocele, and rectocele. She is currently taking Zovirax 400 mg 3 times a day. Patient discontinued the estradiol cream 0.02% that she was applied 3 times weekly because she stated that it caused her to have hot flashes. Patient prior to her hysterectomy never had any history of abnormal Pap smears. Her last Pap smear was in 2012. Patient's last colonoscopy was less than 10 years ago and she states that she has never had an abnormal colonoscopy. Her mammogram was this year which was normal.   Patient's primary care physician is Dr. Donnie Coffin who is been doing her blood work. Patient's vaccines are up-to-date.  Past medical history,surgical history, family history and social history were all reviewed and documented in the EPIC chart.  Gynecologic History No LMP recorded. Patient has had a hysterectomy. Contraception: status post hysterectomy Last Pap: 2012. Results were: normal Last mammogram: 2015. Results were: normal  Obstetric History OB History  Gravida Para Term Preterm AB SAB TAB Ectopic Multiple Living  2 2 2       2     # Outcome Date GA Lbr Len/2nd Weight Sex Delivery Anes PTL Lv  2 TRM           1 TRM                ROS: A ROS was performed and pertinent positives and negatives are included in the history.  GENERAL: No fevers or chills. HEENT: No change in vision, no earache, sore throat or sinus congestion. NECK: No pain  or stiffness. CARDIOVASCULAR: No chest pain or pressure. No palpitations. PULMONARY: No shortness of breath, cough or wheeze. GASTROINTESTINAL: No abdominal pain, nausea, vomiting or diarrhea, melena or bright red blood per rectum. GENITOURINARY: No urinary frequency, urgency, hesitancy or dysuria. MUSCULOSKELETAL: No joint or muscle pain, no back pain, no recent trauma. DERMATOLOGIC: No rash, no itching, no lesions. ENDOCRINE: No polyuria, polydipsia, no heat or cold intolerance. No recent change in weight. HEMATOLOGICAL: No anemia or easy bruising or bleeding. NEUROLOGIC: No headache, seizures, numbness, tingling or weakness. PSYCHIATRIC: No depression, no loss of interest in normal activity or change in sleep pattern.     Exam: chaperone present  BP 134/86  Ht 5\' 4"  (1.626 m)  Wt 185 lb (83.915 kg)  BMI 31.74 kg/m2  Body mass index is 31.74 kg/(m^2).  General appearance : Well developed well nourished female. No acute distress HEENT: Neck supple, trachea midline, no carotid bruits, no thyroidmegaly Lungs: Clear to auscultation, no rhonchi or wheezes, or rib retractions  Heart: Regular rate and rhythm, no murmurs or gallops Breast:Examined in sitting and supine position were symmetrical in appearance, no palpable masses or tenderness,  no skin retraction, no nipple inversion, no nipple discharge, no skin discoloration, no axillary or supraclavicular lymphadenopathy Abdomen: no palpable masses or tenderness, no rebound or guarding Extremities: no edema or skin discoloration or tenderness  Pelvic:  Bartholin, Urethra, Skene Glands: Within normal limits  Vagina: No gross lesions or discharge, vaginal cuff intact, vaginal atrophy  Cervix: Absent  Uterus  absent  Adnexa  Without masses or tenderness  Anus and perineum  normal   Rectovaginal  normal sphincter tone without palpated masses or tenderness             Hemoccult PCP provides     Assessment/Plan:  80 y.o. female  doing well on Prolia this is patient's third year. We discussed keeping her on the medication not more than 5 or 6 years. She had good improvement on the medication. Patient with past history of osteoporosis which has changed with improvement to the level of been osteopenic now. She has had history of fragility fractures in the past. PCP we'll be doing her blood work. Pap smear no longer indicated since she had a history of a hysterectomy and no past history of abnormal Pap smears. Bone density study in 2 years   Terrance Mass MD, 2:39 PM 04/02/2014

## 2014-04-05 ENCOUNTER — Encounter: Payer: Self-pay | Admitting: Gynecology

## 2014-04-28 ENCOUNTER — Other Ambulatory Visit: Payer: Self-pay | Admitting: Dermatology

## 2014-04-28 DIAGNOSIS — Z85828 Personal history of other malignant neoplasm of skin: Secondary | ICD-10-CM | POA: Diagnosis not present

## 2014-04-28 DIAGNOSIS — L820 Inflamed seborrheic keratosis: Secondary | ICD-10-CM | POA: Diagnosis not present

## 2014-04-28 DIAGNOSIS — B078 Other viral warts: Secondary | ICD-10-CM | POA: Diagnosis not present

## 2014-04-28 DIAGNOSIS — L821 Other seborrheic keratosis: Secondary | ICD-10-CM | POA: Diagnosis not present

## 2014-04-28 DIAGNOSIS — L814 Other melanin hyperpigmentation: Secondary | ICD-10-CM | POA: Diagnosis not present

## 2014-06-21 DIAGNOSIS — Z961 Presence of intraocular lens: Secondary | ICD-10-CM | POA: Diagnosis not present

## 2014-06-21 DIAGNOSIS — H43813 Vitreous degeneration, bilateral: Secondary | ICD-10-CM | POA: Diagnosis not present

## 2014-06-21 DIAGNOSIS — H26493 Other secondary cataract, bilateral: Secondary | ICD-10-CM | POA: Diagnosis not present

## 2014-06-23 ENCOUNTER — Other Ambulatory Visit: Payer: Self-pay | Admitting: Gynecology

## 2014-07-14 DIAGNOSIS — H2640 Unspecified secondary cataract: Secondary | ICD-10-CM | POA: Diagnosis not present

## 2014-07-14 DIAGNOSIS — H26492 Other secondary cataract, left eye: Secondary | ICD-10-CM | POA: Diagnosis not present

## 2014-09-20 DIAGNOSIS — I10 Essential (primary) hypertension: Secondary | ICD-10-CM | POA: Diagnosis not present

## 2014-09-20 DIAGNOSIS — E782 Mixed hyperlipidemia: Secondary | ICD-10-CM | POA: Diagnosis not present

## 2014-09-20 DIAGNOSIS — M810 Age-related osteoporosis without current pathological fracture: Secondary | ICD-10-CM | POA: Diagnosis not present

## 2014-09-20 DIAGNOSIS — M150 Primary generalized (osteo)arthritis: Secondary | ICD-10-CM | POA: Diagnosis not present

## 2014-09-20 DIAGNOSIS — E039 Hypothyroidism, unspecified: Secondary | ICD-10-CM | POA: Diagnosis not present

## 2014-10-20 DIAGNOSIS — J069 Acute upper respiratory infection, unspecified: Secondary | ICD-10-CM | POA: Diagnosis not present

## 2014-10-26 DIAGNOSIS — J320 Chronic maxillary sinusitis: Secondary | ICD-10-CM | POA: Diagnosis not present

## 2014-10-26 DIAGNOSIS — J040 Acute laryngitis: Secondary | ICD-10-CM | POA: Diagnosis not present

## 2014-10-26 DIAGNOSIS — J322 Chronic ethmoidal sinusitis: Secondary | ICD-10-CM | POA: Diagnosis not present

## 2014-10-26 DIAGNOSIS — H6121 Impacted cerumen, right ear: Secondary | ICD-10-CM | POA: Diagnosis not present

## 2014-11-03 DIAGNOSIS — J322 Chronic ethmoidal sinusitis: Secondary | ICD-10-CM | POA: Diagnosis not present

## 2014-11-03 DIAGNOSIS — J320 Chronic maxillary sinusitis: Secondary | ICD-10-CM | POA: Diagnosis not present

## 2014-11-11 DIAGNOSIS — R0982 Postnasal drip: Secondary | ICD-10-CM | POA: Diagnosis not present

## 2014-11-11 DIAGNOSIS — J309 Allergic rhinitis, unspecified: Secondary | ICD-10-CM | POA: Diagnosis not present

## 2014-11-11 DIAGNOSIS — R05 Cough: Secondary | ICD-10-CM | POA: Diagnosis not present

## 2015-02-03 DIAGNOSIS — Z1231 Encounter for screening mammogram for malignant neoplasm of breast: Secondary | ICD-10-CM | POA: Diagnosis not present

## 2015-02-04 ENCOUNTER — Encounter: Payer: Self-pay | Admitting: Gynecology

## 2015-03-22 DIAGNOSIS — I10 Essential (primary) hypertension: Secondary | ICD-10-CM | POA: Diagnosis not present

## 2015-03-22 DIAGNOSIS — E782 Mixed hyperlipidemia: Secondary | ICD-10-CM | POA: Diagnosis not present

## 2015-03-22 DIAGNOSIS — Z23 Encounter for immunization: Secondary | ICD-10-CM | POA: Diagnosis not present

## 2015-03-22 DIAGNOSIS — M810 Age-related osteoporosis without current pathological fracture: Secondary | ICD-10-CM | POA: Diagnosis not present

## 2015-03-22 DIAGNOSIS — E039 Hypothyroidism, unspecified: Secondary | ICD-10-CM | POA: Diagnosis not present

## 2015-03-31 DIAGNOSIS — Z96652 Presence of left artificial knee joint: Secondary | ICD-10-CM | POA: Diagnosis not present

## 2015-03-31 DIAGNOSIS — Z471 Aftercare following joint replacement surgery: Secondary | ICD-10-CM | POA: Diagnosis not present

## 2015-04-04 ENCOUNTER — Ambulatory Visit (INDEPENDENT_AMBULATORY_CARE_PROVIDER_SITE_OTHER): Payer: Medicare Other | Admitting: Gynecology

## 2015-04-04 ENCOUNTER — Other Ambulatory Visit: Payer: Self-pay | Admitting: *Deleted

## 2015-04-04 ENCOUNTER — Encounter: Payer: Self-pay | Admitting: Gynecology

## 2015-04-04 VITALS — BP 124/74 | Ht 63.75 in | Wt 189.2 lb

## 2015-04-04 DIAGNOSIS — N898 Other specified noninflammatory disorders of vagina: Secondary | ICD-10-CM

## 2015-04-04 DIAGNOSIS — Z01419 Encounter for gynecological examination (general) (routine) without abnormal findings: Secondary | ICD-10-CM

## 2015-04-04 DIAGNOSIS — N816 Rectocele: Secondary | ICD-10-CM | POA: Diagnosis not present

## 2015-04-04 DIAGNOSIS — M810 Age-related osteoporosis without current pathological fracture: Secondary | ICD-10-CM

## 2015-04-04 LAB — WET PREP FOR TRICH, YEAST, CLUE
TRICH WET PREP: NONE SEEN
Yeast Wet Prep HPF POC: NONE SEEN

## 2015-04-04 MED ORDER — METRONIDAZOLE 0.75 % VA GEL: 1.0000 | Freq: Every day | VAGINAL | Status: DC

## 2015-04-04 MED ORDER — METRONIDAZOLE 0.75 % VA GEL: 1.0000 | Freq: Two times a day (BID) | VAGINAL | Status: DC

## 2015-04-04 MED ORDER — ACYCLOVIR 400 MG PO TABS: 400.0000 mg | ORAL_TABLET | Freq: Three times a day (TID) | ORAL | Status: DC

## 2015-04-04 NOTE — Progress Notes (Signed)
Kimberly Webb 1933/03/14 [id number]   History:    81 y.o.  for annual gyn exam with complaint of some vulvar pruritus and some slight clear discharge.Patient has history of 3 fragility fractures in the past and had been on Fosamax and Evista and the past and due to patient's forgetfulness her primary physician started her on Prolia in 2012. Her last bone density study showed significant improvement in her osteopenia in 2013.Review of patient's records indicated in 2004 she had a transvaginal hysterectomy with bilateral salpingo-oophorectomy along with anterior and posterior repair as a result of uterine prolapse, cystocele, and rectocele. She is currently taking Zovirax 400 mg 3 times a day. Patient discontinued the estradiol cream 0.02% that she was applied 3 times weekly because she stated that it caused her to have hot flashes. Patient prior to her hysterectomy never had any history of abnormal Pap smears. Her last Pap smear was in 2012. Patient's last colonoscopy was less than 10 years ago and she states that she has never had an abnormal colonoscopy. Her mammogram was this year which was normal.  Patient's primary care physician is Dr. Donnie Coffin who is been doing her blood work. Patient's vaccines are up-to-date.   Past medical history,surgical history, family history and social history were all reviewed and documented in the EPIC chart.  Gynecologic History No LMP recorded. Patient has had a hysterectomy. Contraception: status post hysterectomy Last Pap: 2012. Results were: normal Last mammogram: 2016. Results were: normal  Obstetric History OB History  Gravida Para Term Preterm AB SAB TAB Ectopic Multiple Living  2 2 2       2     # Outcome Date GA Lbr Len/2nd Weight Sex Delivery Anes PTL Lv  2 Term           1 Term                ROS: A ROS was performed and pertinent positives and negatives are included in the history.  GENERAL: No fevers or chills. HEENT: No change  in vision, no earache, sore throat or sinus congestion. NECK: No pain or stiffness. CARDIOVASCULAR: No chest pain or pressure. No palpitations. PULMONARY: No shortness of breath, cough or wheeze. GASTROINTESTINAL: No abdominal pain, nausea, vomiting or diarrhea, melena or bright red blood per rectum. GENITOURINARY: No urinary frequency, urgency, hesitancy or dysuria. MUSCULOSKELETAL: No joint or muscle pain, no back pain, no recent trauma. DERMATOLOGIC: No rash, no itching, no lesions. ENDOCRINE: No polyuria, polydipsia, no heat or cold intolerance. No recent change in weight. HEMATOLOGICAL: No anemia or easy bruising or bleeding. NEUROLOGIC: No headache, seizures, numbness, tingling or weakness. PSYCHIATRIC: No depression, no loss of interest in normal activity or change in sleep pattern.     Exam: chaperone present  BP 124/74 mmHg  Ht 5' 3.75" (1.619 m)  Wt 189 lb 3.2 oz (85.821 kg)  BMI 32.74 kg/m2  Body mass index is 32.74 kg/(m^2).  General appearance : Well developed well nourished female. No acute distress HEENT: Eyes: no retinal hemorrhage or exudates,  Neck supple, trachea midline, no carotid bruits, no thyroidmegaly Lungs: Clear to auscultation, no rhonchi or wheezes, or rib retractions  Heart: Regular rate and rhythm, no murmurs or gallops Breast:Examined in sitting and supine position were symmetrical in appearance, no palpable masses or tenderness,  no skin retraction, no nipple inversion, no nipple discharge, no skin discoloration, no axillary or supraclavicular lymphadenopathy Abdomen: no palpable masses or tenderness, no rebound or guarding Extremities:  no edema or skin discoloration or tenderness  Pelvic:  Bartholin, Urethra, Skene Glands: Within normal limits             Vagina: No gross lesions or discharge  Cervix: Absent  Uterus  absent  Adnexa  Without masses or tenderness  Anus and perineum  normal   Rectovaginal  normal sphincter tone without palpated masses or  tenderness             Hemoccult PCP provides   Wet prep few clue cells, rare WBC, too numerous to count bacteria  Assessment/Plan:  81 y.o. female for annual exam with clinical evidence of bacterial vaginosis. She is going to be placed on MetroGel vaginal cream to apply daily at bedtime for one week. PCP we'll be doing her blood work. Patient no longer needs Pap smears. PCP has been doing her bone density study.   Terrance Mass MD, 2:41 PM 04/04/2015

## 2015-04-04 NOTE — Patient Instructions (Signed)
Metronidazole vaginal gel What is this medicine? METRONIDAZOLE (me troe NI da zole) VAGINAL GEL is an antiinfective. It is used to treat bacterial vaginitis. This medicine may be used for other purposes; ask your health care provider or pharmacist if you have questions. What should I tell my health care provider before I take this medicine? They need to know if you have any of these conditions: -if you drink alcohol containing drinks -if you have taken disulfiram in the past two weeks -liver disease -peripheral neuropathy -seizures -an unusual or allergic reaction to metronidazole, parabens, nitroimidazoles, or other medicines, foods, dyes, or preservatives -pregnant or trying to get pregnant -breast-feeding How should I use this medicine? This medicine is only for use in the vagina. Do not take by mouth or apply to other areas of the body. Follow the directions on the prescription label. Wash hands before and after use. Screw the applicator to the tube and squeeze the tube gently to fill the applicator. Lie on your back, part and bend your knees. Insert the applicator tip high in the vagina and push the plunger to release the gel into the vagina. Gently remove the applicator. Wash the applicator well with warm water and soap. Use at regular intervals. Finish the full course prescribed by your doctor or health care professional even if you think your condition is better. Do not stop using except on the advice of your doctor or health care professional. Talk to your pediatrician regarding the use of this medicine in children. Special care may be needed. Overdosage: If you think you have taken too much of this medicine contact a poison control center or emergency room at once. NOTE: This medicine is only for you. Do not share this medicine with others. What if I miss a dose? If you miss a dose, use it as soon as you can. If it is almost time for your next dose, use only that dose. Do not use double  or extra doses. What may interact with this medicine? Do not take this medicine with any of the following medications: -alcohol or any product that contains alcohol -cisapride -dofetilide -dronedarone -pimozide -thioridazine -ziprasidone This medicine may also interact with the following medications: -cimetidine -lithium -other medicines that prolong the QT interval (cause an abnormal heart rhythm) -warfarin This list may not describe all possible interactions. Give your health care provider a list of all the medicines, herbs, non-prescription drugs, or dietary supplements you use. Also tell them if you smoke, drink alcohol, or use illegal drugs. Some items may interact with your medicine. What should I watch for while using this medicine? Tell your doctor or health care professional if your symptoms do not start to get better in 2 or 3 days. Avoid alcoholic drinks while you are taking this medicine and for three days afterwards. Alcohol may make you feel dizzy, sick, or flushed. You may get drowsy or dizzy. Do not drive, use machinery, or do anything that needs mental alertness until you know how this medicine affects you. To reduce the risk of dizzy or fainting spells, do not sit or stand up quickly, especially if you are an older patient. Your clothing may get soiled if you have a vaginal discharge. You can wear a sanitary napkin. Do not use tampons. Wear freshly washed cotton, not synthetic, panties. Do not have sex until you have finished your treatment. Having sex can make the treatment less effective. Your sexual partner may also need treatment. What side effects may I  notice from receiving this medicine? Side effects that you should report to your doctor or health care professional as soon as possible: -dizziness -frequent passing of urine -headache -loss of appetite -nausea -skin rash, itching -stomach pain or cramps -vaginal irritation or discharge -vulvar burning or  swelling Side effects that usually do not require medical attention (report to your doctor or health care professional if they continue or are bothersome): -dark urine -mild vaginal burning This list may not describe all possible side effects. Call your doctor for medical advice about side effects. You may report side effects to FDA at 1-800-FDA-1088. Where should I keep my medicine? Keep out of the reach of children. Store at room temperature between 15 and 30 degrees C (59 and 86 degrees F). Do not freeze. Throw away any unused medicine after the expiration date. NOTE: This sheet is a summary. It may not cover all possible information. If you have questions about this medicine, talk to your doctor, pharmacist, or health care provider.    2016, [employer] Standard. (2012-12-26 14:09:23)

## 2015-06-29 DIAGNOSIS — L814 Other melanin hyperpigmentation: Secondary | ICD-10-CM | POA: Diagnosis not present

## 2015-06-29 DIAGNOSIS — L821 Other seborrheic keratosis: Secondary | ICD-10-CM | POA: Diagnosis not present

## 2015-06-29 DIAGNOSIS — L570 Actinic keratosis: Secondary | ICD-10-CM | POA: Diagnosis not present

## 2015-06-29 DIAGNOSIS — D1801 Hemangioma of skin and subcutaneous tissue: Secondary | ICD-10-CM | POA: Diagnosis not present

## 2015-06-29 DIAGNOSIS — Z85828 Personal history of other malignant neoplasm of skin: Secondary | ICD-10-CM | POA: Diagnosis not present

## 2015-06-29 DIAGNOSIS — C44319 Basal cell carcinoma of skin of other parts of face: Secondary | ICD-10-CM | POA: Diagnosis not present

## 2015-07-12 DIAGNOSIS — H02831 Dermatochalasis of right upper eyelid: Secondary | ICD-10-CM | POA: Diagnosis not present

## 2015-07-12 DIAGNOSIS — H52203 Unspecified astigmatism, bilateral: Secondary | ICD-10-CM | POA: Diagnosis not present

## 2015-07-12 DIAGNOSIS — H43813 Vitreous degeneration, bilateral: Secondary | ICD-10-CM | POA: Diagnosis not present

## 2015-07-12 DIAGNOSIS — H26491 Other secondary cataract, right eye: Secondary | ICD-10-CM | POA: Diagnosis not present

## 2015-07-14 DIAGNOSIS — C4401 Basal cell carcinoma of skin of lip: Secondary | ICD-10-CM | POA: Diagnosis not present

## 2015-07-14 DIAGNOSIS — Z85828 Personal history of other malignant neoplasm of skin: Secondary | ICD-10-CM | POA: Diagnosis not present

## 2015-09-22 DIAGNOSIS — E782 Mixed hyperlipidemia: Secondary | ICD-10-CM | POA: Diagnosis not present

## 2015-09-22 DIAGNOSIS — M810 Age-related osteoporosis without current pathological fracture: Secondary | ICD-10-CM | POA: Diagnosis not present

## 2015-09-22 DIAGNOSIS — I10 Essential (primary) hypertension: Secondary | ICD-10-CM | POA: Diagnosis not present

## 2015-09-22 DIAGNOSIS — E039 Hypothyroidism, unspecified: Secondary | ICD-10-CM | POA: Diagnosis not present

## 2016-02-07 ENCOUNTER — Encounter: Payer: Self-pay | Admitting: Gynecology

## 2016-02-07 DIAGNOSIS — Z1231 Encounter for screening mammogram for malignant neoplasm of breast: Secondary | ICD-10-CM | POA: Diagnosis not present

## 2016-02-08 ENCOUNTER — Other Ambulatory Visit: Payer: Self-pay | Admitting: *Deleted

## 2016-02-08 DIAGNOSIS — M810 Age-related osteoporosis without current pathological fracture: Secondary | ICD-10-CM

## 2016-03-26 DIAGNOSIS — E039 Hypothyroidism, unspecified: Secondary | ICD-10-CM | POA: Diagnosis not present

## 2016-03-26 DIAGNOSIS — E782 Mixed hyperlipidemia: Secondary | ICD-10-CM | POA: Diagnosis not present

## 2016-03-26 DIAGNOSIS — M810 Age-related osteoporosis without current pathological fracture: Secondary | ICD-10-CM | POA: Diagnosis not present

## 2016-03-26 DIAGNOSIS — Z23 Encounter for immunization: Secondary | ICD-10-CM | POA: Diagnosis not present

## 2016-03-26 DIAGNOSIS — I10 Essential (primary) hypertension: Secondary | ICD-10-CM | POA: Diagnosis not present

## 2016-03-29 ENCOUNTER — Ambulatory Visit (INDEPENDENT_AMBULATORY_CARE_PROVIDER_SITE_OTHER): Payer: Medicare Other

## 2016-03-29 DIAGNOSIS — M810 Age-related osteoporosis without current pathological fracture: Secondary | ICD-10-CM | POA: Diagnosis not present

## 2016-04-04 ENCOUNTER — Ambulatory Visit (INDEPENDENT_AMBULATORY_CARE_PROVIDER_SITE_OTHER): Payer: Medicare Other | Admitting: Gynecology

## 2016-04-04 ENCOUNTER — Encounter: Payer: Self-pay | Admitting: Gynecology

## 2016-04-04 VITALS — BP 120/84 | Ht 64.0 in | Wt 196.0 lb

## 2016-04-04 DIAGNOSIS — N952 Postmenopausal atrophic vaginitis: Secondary | ICD-10-CM

## 2016-04-04 DIAGNOSIS — Z8619 Personal history of other infectious and parasitic diseases: Secondary | ICD-10-CM | POA: Insufficient documentation

## 2016-04-04 DIAGNOSIS — Z01411 Encounter for gynecological examination (general) (routine) with abnormal findings: Secondary | ICD-10-CM | POA: Diagnosis not present

## 2016-04-04 DIAGNOSIS — M8580 Other specified disorders of bone density and structure, unspecified site: Secondary | ICD-10-CM | POA: Diagnosis not present

## 2016-04-04 MED ORDER — ACYCLOVIR 400 MG PO TABS: 400.0000 mg | ORAL_TABLET | Freq: Three times a day (TID) | ORAL | 3 refills | Status: DC

## 2016-04-04 NOTE — Progress Notes (Signed)
Kimberly Webb [birth year] [id number]   History:    82 y.o.  for annual gyn exam with no complaints today. Her primary care physician is Dr. Alroy Dust who is been doing her blood work and her vaccines are up-to-date. Review of patient's record indicated she has had past history of 3 fragility fractures in the past and had been on Fosamax and Evista and the past and due to patient's forgetfulness her primary physician started her on Prolia in 2012. Her last bone density study showed no statistically significant change in bone mineralization with compare with study in 2015.Review of patient's records indicated in 2004 she had a transvaginal hysterectomy with bilateral salpingo-oophorectomy along with anterior and posterior repair as a result of uterine prolapse, cystocele, and rectocele. Because of past history of A Herpes Patient Been Taking Zovirax 400 Mg 3 Times A Day for Which She Was Counseled Today to Cut down to 1 a Day. In the past for Her Vaginal Atrophy She Had Been Prescribed Estradiol Cream 0.02%. She Stated She Began Experiencing Hot Flashes As a Result of This and Discontinued in Currently Not on the Vaginal Estrogen. Prior to Her Hysterectomy She Had Always Had Normal Pap Smears. She Reports Her Last Colonoscopy Less Than 80 Years Ago Was Normal.  Past medical history,surgical history, family history and social history were all reviewed and documented in the EPIC chart.  Gynecologic History No LMP recorded. Patient has had a hysterectomy. Contraception: post menopausal status Last Pap: Several years ago. Results were: normal Last mammogram: 2017. Results were: normal  Obstetric History OB History  Gravida Para Term Preterm AB Living  2 2 2     2   SAB TAB Ectopic Multiple Live Births               # Outcome Date GA Lbr Len/2nd Weight Sex Delivery Anes PTL Lv  2 Term           1 Term                ROS: A ROS was performed and pertinent positives and negatives are included in  the history.  GENERAL: No fevers or chills. HEENT: No change in vision, no earache, sore throat or sinus congestion. NECK: No pain or stiffness. CARDIOVASCULAR: No chest pain or pressure. No palpitations. PULMONARY: No shortness of breath, cough or wheeze. GASTROINTESTINAL: No abdominal pain, nausea, vomiting or diarrhea, melena or bright red blood per rectum. GENITOURINARY: No urinary frequency, urgency, hesitancy or dysuria. MUSCULOSKELETAL: No joint or muscle pain, no back pain, no recent trauma. DERMATOLOGIC: No rash, no itching, no lesions. ENDOCRINE: No polyuria, polydipsia, no heat or cold intolerance. No recent change in weight. HEMATOLOGICAL: No anemia or easy bruising or bleeding. NEUROLOGIC: No headache, seizures, numbness, tingling or weakness. PSYCHIATRIC: No depression, no loss of interest in normal activity or change in sleep pattern.     Exam: chaperone present  BP 120/84   Ht 5\' 4"  (1.626 m)   Wt 196 lb (88.9 kg)   BMI 33.64 kg/m   Body mass index is 33.64 kg/m.  General appearance : Well developed well nourished female. No acute distress HEENT: Eyes: no retinal hemorrhage or exudates,  Neck supple, trachea midline, no carotid bruits, no thyroidmegaly Lungs: Clear to auscultation, no rhonchi or wheezes, or rib retractions  Heart: Regular rate and rhythm, no murmurs or gallops Breast:Examined in sitting and supine position were symmetrical in appearance, no palpable masses or tenderness,  no skin retraction,  no nipple inversion, no nipple discharge, no skin discoloration, no axillary or supraclavicular lymphadenopathy Abdomen: no palpable masses or tenderness, no rebound or guarding Extremities: no edema or skin discoloration or tenderness  Pelvic:  Bartholin, Urethra, Skene Glands: Within normal limits             Vagina: No gross lesions or discharge, atrophic changes  Cervix: Absent  Uterus  absent  Adnexa  Without masses or tenderness  Anus and perineum  normal    Rectovaginal  normal sphincter tone without palpated masses or tenderness             Hemoccult PCP provides     Assessment/Plan:  82 y.o. female for annual exam with past history of genital herpes I've recommended the patient cut down her Valtrex for 4 mg 3 times a day to 1 tablet daily. We'll monitor symptoms. She does have vaginal atrophy we discussed risks benefits and pros and cons of vaginal estrogen patient is not interested. Patient was stable osteopenia high-risk for fracture and past history of fragility fractures on P the PCP is monitoring. Pap smear no longer indicated according to new guidelines.Reece Levy H MD, 2:23 PM 04/04/2016

## 2016-05-18 DIAGNOSIS — J410 Simple chronic bronchitis: Secondary | ICD-10-CM | POA: Diagnosis not present

## 2016-05-18 DIAGNOSIS — J320 Chronic maxillary sinusitis: Secondary | ICD-10-CM | POA: Diagnosis not present

## 2016-05-18 DIAGNOSIS — J370 Chronic laryngitis: Secondary | ICD-10-CM | POA: Diagnosis not present

## 2016-05-18 DIAGNOSIS — J322 Chronic ethmoidal sinusitis: Secondary | ICD-10-CM | POA: Diagnosis not present

## 2016-06-13 ENCOUNTER — Other Ambulatory Visit: Payer: Self-pay | Admitting: Gynecology

## 2016-06-15 DIAGNOSIS — J322 Chronic ethmoidal sinusitis: Secondary | ICD-10-CM | POA: Diagnosis not present

## 2016-06-15 DIAGNOSIS — J370 Chronic laryngitis: Secondary | ICD-10-CM | POA: Diagnosis not present

## 2016-06-15 DIAGNOSIS — J320 Chronic maxillary sinusitis: Secondary | ICD-10-CM | POA: Diagnosis not present

## 2016-06-15 DIAGNOSIS — J410 Simple chronic bronchitis: Secondary | ICD-10-CM | POA: Diagnosis not present

## 2016-06-15 DIAGNOSIS — R1312 Dysphagia, oropharyngeal phase: Secondary | ICD-10-CM | POA: Diagnosis not present

## 2016-06-25 ENCOUNTER — Ambulatory Visit
Admission: RE | Admit: 2016-06-25 | Discharge: 2016-06-25 | Disposition: A | Discharge status: Home / Self Care | Payer: Medicare Other | Source: Ambulatory Visit | Attending: Otolaryngology | Admitting: Otolaryngology

## 2016-06-25 ENCOUNTER — Other Ambulatory Visit: Payer: Self-pay | Admitting: Otolaryngology

## 2016-06-25 DIAGNOSIS — R05 Cough: Secondary | ICD-10-CM

## 2016-06-25 DIAGNOSIS — J410 Simple chronic bronchitis: Secondary | ICD-10-CM | POA: Diagnosis not present

## 2016-06-25 DIAGNOSIS — J320 Chronic maxillary sinusitis: Secondary | ICD-10-CM | POA: Diagnosis not present

## 2016-06-25 DIAGNOSIS — J40 Bronchitis, not specified as acute or chronic: Secondary | ICD-10-CM

## 2016-06-25 DIAGNOSIS — R053 Chronic cough: Secondary | ICD-10-CM

## 2016-06-25 DIAGNOSIS — J3089 Other allergic rhinitis: Secondary | ICD-10-CM | POA: Diagnosis not present

## 2016-06-25 DIAGNOSIS — J040 Acute laryngitis: Secondary | ICD-10-CM | POA: Diagnosis not present

## 2016-07-04 DIAGNOSIS — J040 Acute laryngitis: Secondary | ICD-10-CM | POA: Diagnosis not present

## 2016-07-04 DIAGNOSIS — J410 Simple chronic bronchitis: Secondary | ICD-10-CM | POA: Diagnosis not present

## 2016-07-12 DIAGNOSIS — R2689 Other abnormalities of gait and mobility: Secondary | ICD-10-CM | POA: Diagnosis not present

## 2016-07-12 DIAGNOSIS — E039 Hypothyroidism, unspecified: Secondary | ICD-10-CM | POA: Diagnosis not present

## 2016-07-12 DIAGNOSIS — R42 Dizziness and giddiness: Secondary | ICD-10-CM | POA: Diagnosis not present

## 2016-07-18 DIAGNOSIS — Z961 Presence of intraocular lens: Secondary | ICD-10-CM | POA: Diagnosis not present

## 2016-07-18 DIAGNOSIS — H43813 Vitreous degeneration, bilateral: Secondary | ICD-10-CM | POA: Diagnosis not present

## 2016-07-18 DIAGNOSIS — H26491 Other secondary cataract, right eye: Secondary | ICD-10-CM | POA: Diagnosis not present

## 2016-07-18 DIAGNOSIS — H52203 Unspecified astigmatism, bilateral: Secondary | ICD-10-CM | POA: Diagnosis not present

## 2016-07-19 DIAGNOSIS — R8290 Unspecified abnormal findings in urine: Secondary | ICD-10-CM | POA: Diagnosis not present

## 2016-07-26 DIAGNOSIS — L821 Other seborrheic keratosis: Secondary | ICD-10-CM | POA: Diagnosis not present

## 2016-07-26 DIAGNOSIS — D2272 Melanocytic nevi of left lower limb, including hip: Secondary | ICD-10-CM | POA: Diagnosis not present

## 2016-07-26 DIAGNOSIS — L814 Other melanin hyperpigmentation: Secondary | ICD-10-CM | POA: Diagnosis not present

## 2016-07-26 DIAGNOSIS — D1801 Hemangioma of skin and subcutaneous tissue: Secondary | ICD-10-CM | POA: Diagnosis not present

## 2016-07-26 DIAGNOSIS — D2261 Melanocytic nevi of right upper limb, including shoulder: Secondary | ICD-10-CM | POA: Diagnosis not present

## 2016-07-26 DIAGNOSIS — D2271 Melanocytic nevi of right lower limb, including hip: Secondary | ICD-10-CM | POA: Diagnosis not present

## 2016-07-26 DIAGNOSIS — D225 Melanocytic nevi of trunk: Secondary | ICD-10-CM | POA: Diagnosis not present

## 2016-07-26 DIAGNOSIS — C44719 Basal cell carcinoma of skin of left lower limb, including hip: Secondary | ICD-10-CM | POA: Diagnosis not present

## 2016-07-26 DIAGNOSIS — Z85828 Personal history of other malignant neoplasm of skin: Secondary | ICD-10-CM | POA: Diagnosis not present

## 2016-07-30 DIAGNOSIS — K649 Unspecified hemorrhoids: Secondary | ICD-10-CM | POA: Diagnosis not present

## 2016-07-30 DIAGNOSIS — N3000 Acute cystitis without hematuria: Secondary | ICD-10-CM | POA: Diagnosis not present

## 2016-07-30 DIAGNOSIS — R109 Unspecified abdominal pain: Secondary | ICD-10-CM | POA: Diagnosis not present

## 2016-08-09 DIAGNOSIS — H26491 Other secondary cataract, right eye: Secondary | ICD-10-CM | POA: Diagnosis not present

## 2016-08-17 DIAGNOSIS — Z85828 Personal history of other malignant neoplasm of skin: Secondary | ICD-10-CM | POA: Diagnosis not present

## 2016-08-17 DIAGNOSIS — C44719 Basal cell carcinoma of skin of left lower limb, including hip: Secondary | ICD-10-CM | POA: Diagnosis not present

## 2016-09-06 ENCOUNTER — Encounter: Payer: Self-pay | Admitting: Gynecology

## 2016-09-06 ENCOUNTER — Ambulatory Visit (INDEPENDENT_AMBULATORY_CARE_PROVIDER_SITE_OTHER): Payer: Medicare Other | Admitting: Gynecology

## 2016-09-06 VITALS — BP 132/84 | Ht 64.0 in | Wt 195.4 lb

## 2016-09-06 DIAGNOSIS — R350 Frequency of micturition: Secondary | ICD-10-CM | POA: Diagnosis not present

## 2016-09-06 DIAGNOSIS — R8299 Other abnormal findings in urine: Secondary | ICD-10-CM | POA: Diagnosis not present

## 2016-09-06 DIAGNOSIS — N898 Other specified noninflammatory disorders of vagina: Secondary | ICD-10-CM | POA: Diagnosis not present

## 2016-09-06 DIAGNOSIS — R82998 Other abnormal findings in urine: Secondary | ICD-10-CM

## 2016-09-06 DIAGNOSIS — Z780 Asymptomatic menopausal state: Secondary | ICD-10-CM | POA: Insufficient documentation

## 2016-09-06 LAB — URINALYSIS W MICROSCOPIC + REFLEX CULTURE
Bilirubin Urine: NEGATIVE
CASTS: NONE SEEN [LPF]
CRYSTALS: NONE SEEN [HPF]
Glucose, UA: NEGATIVE
HGB URINE DIPSTICK: NEGATIVE
KETONES UR: NEGATIVE
Leukocytes, UA: NEGATIVE
Nitrite: NEGATIVE
PROTEIN: NEGATIVE
RBC / HPF: NONE SEEN RBC/HPF (ref <=2)
Specific Gravity, Urine: 1.02 (ref 1.001–1.035)
YEAST: NONE SEEN [HPF]
pH: 5 (ref 5.0–8.0)

## 2016-09-06 NOTE — Progress Notes (Signed)
   Patient is an 83 year old that presented to the office today stating that she had noted some darkness in her urine and was at slight odor. Patient is not sexually active. Many years ago she had a vaginal hysterectomy with bilateral salpingo-oophorectomy along with anterior and posterior repair. She is on a diuretic for hypertension so she is urinating frequently but denies any dysuria, fever, chills, nausea, vomiting or any back pain. Patient stated that her PCP treated her for urinary tract infection 1 month ago.  Exam: Pelvic: Bartholin urethra Skene glands with atrophic changes Vagina: Atrophic changes vaginal cuff intact Bimanual exam not done Rectal exam: Not done  Wet prep few bacteria otherwise normal  Urinalysis few bacteria, 0-5 WBC no red blood cells seen culture submitted  Assessment/plan: I have reassured patient that she does not have any evidence of a vaginal infection no abnormalities noted in the vagina, perineum or perirectal region. I also reassured her that she did not have any evidence of a urinary tract infection although we did send her urine for culture.

## 2016-09-07 DIAGNOSIS — N898 Other specified noninflammatory disorders of vagina: Secondary | ICD-10-CM | POA: Insufficient documentation

## 2016-09-07 LAB — WET PREP FOR TRICH, YEAST, CLUE
Clue Cells Wet Prep HPF POC: NONE SEEN
TRICH WET PREP: NONE SEEN
WBC, Wet Prep HPF POC: NONE SEEN
YEAST WET PREP: NONE SEEN

## 2016-09-07 LAB — URINE CULTURE: Organism ID, Bacteria: NO GROWTH

## 2016-09-07 NOTE — Addendum Note (Signed)
Addended by: Dorothyann Gibbs on: 09/07/2016 08:31 AM   Modules accepted: Orders

## 2016-09-12 ENCOUNTER — Encounter: Payer: Self-pay | Admitting: Podiatry

## 2016-09-12 ENCOUNTER — Ambulatory Visit (INDEPENDENT_AMBULATORY_CARE_PROVIDER_SITE_OTHER): Payer: Medicare Other | Admitting: Podiatry

## 2016-09-12 VITALS — BP 129/70 | HR 70 | Resp 16 | Ht 64.0 in | Wt 195.0 lb

## 2016-09-12 DIAGNOSIS — L600 Ingrowing nail: Secondary | ICD-10-CM | POA: Diagnosis not present

## 2016-09-12 NOTE — Patient Instructions (Addendum)
ANTIBACTERIAL SOAP INSTRUCTIONS  THE DAY AFTER PROCEDURE  Please follow the instructions your doctor has marked.   Shower as usual. Before getting out, place a drop of antibacterial liquid soap (Dial) on a wet, clean washcloth.  Gently wipe washcloth over affected area.  Afterward, rinse the area with warm water.  Blot the area dry with a soft cloth and cover with antibiotic ointment (neosporin, polysporin, bacitracin) and band aid or gauze and tape  Place 3-4 drops of antibacterial liquid soap in a quart of warm tap water.  Submerge foot into water for 2 minutes.  If bandage was applied after your procedure, leave on to allow for easy lift off, then remove and continue with soak for the remaining time.  Next, blot area dry with a soft cloth and cover with a bandage.  Apply other medications as directed by your doctor, such as cortisporin otic solution (eardrops) or neosporin antibiotic ointment  Okay to use liquid hand soap in warm water for soaks  Okay to use Tylenol for pain control if needed  Return if you have any future concerns

## 2016-09-12 NOTE — Progress Notes (Signed)
   Subjective:    Patient ID: Kimberly Webb, female    DOB: 1933/03/15, 83 y.o.   MRN: [id number]  HPI This patient presents today with approximately six-month history of increasing pain along the medial margins of the right and left hallux toenails. The patient has attempted self care without any improvement of symptoms. Patient recalls history of previous permanent nail surgery 10+ years ago possibly to the medial margins of the right and left hallux toenails.     Review of Systems  All other systems reviewed and are negative.  Patient former smoker 40 years      Objective:   Physical Exam  Orientated 3  Vascular: DP and PT pulses 2/4 bilaterally Capillary reflex immediate bilaterally  Neurological: Sensation to 10 g monofilament wire intact 5/5 bilaterally Vibratory sensation reactive bilaterally Ankle reflex equal and reactive bilaterally  Dermatological: No skin lesions bilaterally Atrophic skin bilaterally The medial margins of the right and left hallux toenails are incurvated with callused nail grooves. The medial margins are tender to direct palpation which duplicates patient's discomfort  Musculoskeletal: Manual motor testing dorsi flexion, plantar flexion, inversion, eversion 5/5 bilaterally Is no restriction in range of motion ankle, subtalar, midtarsal joints bilaterally       Assessment & Plan:    Assessment: Satisfactory neurovascular status Recurrence of ingrowing medial margins of the right and left hallux toenails with associated callused medial nail grooves  Plan: Discuss treatment options including repetitive debridement versus permanent removal of the nail margins. Patient opts permanent nail removal and verbally consents to procedure  Each right and left hallux toenail block with 3 mL 50-50 mixture of 2% plain Xylocaine and 0.5% plain Sensorcaine. The toes were prepped with Betadine and exsanguinated. The medial margins of the right and  left hallux toenails were excised and a phenol matricectomy performed. The wounds were flushed with alcohol and an antibiotic compression dressing applied. The tourniquets were released and spontaneous capillary fill time were noted in the right and left hallux toes. Patient tolerated procedure without any difficulty. Postoperative oral and written instructions provided. Reappoint at patient's request if she has any future concerns

## 2016-10-04 DIAGNOSIS — I10 Essential (primary) hypertension: Secondary | ICD-10-CM | POA: Diagnosis not present

## 2016-10-04 DIAGNOSIS — E039 Hypothyroidism, unspecified: Secondary | ICD-10-CM | POA: Diagnosis not present

## 2016-10-04 DIAGNOSIS — E782 Mixed hyperlipidemia: Secondary | ICD-10-CM | POA: Diagnosis not present

## 2016-10-04 DIAGNOSIS — M810 Age-related osteoporosis without current pathological fracture: Secondary | ICD-10-CM | POA: Diagnosis not present

## 2016-10-04 DIAGNOSIS — Z23 Encounter for immunization: Secondary | ICD-10-CM | POA: Diagnosis not present

## 2016-10-17 ENCOUNTER — Encounter: Payer: Self-pay | Admitting: Gynecology

## 2017-02-07 DIAGNOSIS — Z1231 Encounter for screening mammogram for malignant neoplasm of breast: Secondary | ICD-10-CM | POA: Diagnosis not present

## 2017-02-21 DIAGNOSIS — Z23 Encounter for immunization: Secondary | ICD-10-CM | POA: Diagnosis not present

## 2017-04-05 ENCOUNTER — Ambulatory Visit (INDEPENDENT_AMBULATORY_CARE_PROVIDER_SITE_OTHER): Payer: Medicare Other | Admitting: Gynecology

## 2017-04-05 ENCOUNTER — Encounter: Payer: Self-pay | Admitting: Gynecology

## 2017-04-05 VITALS — BP 124/80 | Ht 64.0 in | Wt 195.0 lb

## 2017-04-05 DIAGNOSIS — M810 Age-related osteoporosis without current pathological fracture: Secondary | ICD-10-CM

## 2017-04-05 DIAGNOSIS — Z01411 Encounter for gynecological examination (general) (routine) with abnormal findings: Secondary | ICD-10-CM

## 2017-04-05 DIAGNOSIS — N952 Postmenopausal atrophic vaginitis: Secondary | ICD-10-CM

## 2017-04-05 NOTE — Progress Notes (Signed)
    NIAJAH SIPOS 05-09-1933 [id number]        83 y.o.  G2P2002 for breast and pelvic exam.  Former patient of Dr. Toney Rakes.  Reviewing his records show patient underwent TVH BSO anterior posterior colporrhaphy for pelvic relaxation.  History of genital herpes had been on acyclovir 3 times daily but decrease to 1 times daily last year and has not had any outbreaks for a number of years.  Been on vaginal estrogen cream but this had been discontinued and she is not having any symptoms attributable to atrophy.  History of fragility fractures.  Last bone density 03/2016 with T score -2.2.  Has been on Prolia through her primary physician's office since approximately 2012.  Past medical history,surgical history, problem list, medications, allergies, family history and social history were all reviewed and documented as reviewed in the EPIC chart.  ROS:  Performed with pertinent positives and negatives included in the history, assessment and plan.   Additional significant findings : None   Exam: Wandra Scot assistant Vitals:   04/05/17 1456  BP: 124/80  Weight: 195 lb (88.5 kg)  Height: 5\' 4"  (1.626 m)   Body mass index is 33.47 kg/m.  General appearance:  Normal affect, orientation and appearance. Skin: Grossly normal HEENT: Without gross lesions.  No cervical or supraclavicular adenopathy. Thyroid normal.  Lungs:  Clear without wheezing, rales or rhonchi Cardiac: RR, without RMG Abdominal:  Soft, nontender, without masses, guarding, rebound, organomegaly or hernia Breasts:  Examined lying and sitting without masses, retractions, discharge or axillary adenopathy. Pelvic:  Ext, BUS, Vagina: With atrophic changes  Adnexa: Without masses or tenderness    Anus and perineum: Normal   Rectovaginal: Normal sphincter tone without palpated masses or tenderness.    Assessment/Plan:  83 y.o. [id number] female for breast and pelvic exam.   1. Postmenopausal/atrophic genital changes.  No  significant hot flushes, night sweats, vaginal dryness. 2. 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  Most recent DEXA 2017 stable overall from prior DEXA.  T score -2.2.  Is on Prolia through Dr. Virgilio Belling office.  Will continue to follow-up with him in reference to this. 3. Colonoscopy unclear.  Patient has appointment to see Dr. Alroy Dust over the next 1-2 weeks and she is going to follow-up with him in reference to when her last colonoscopy was and if he recommends continuing screening colonoscopies based on her age. 4. Mammography 02/2017.  Breast exam normal today.  Recommend continuing annual mammography when due. 5. Pap smear 2012.  No Pap smear done today.  No history of abnormal Pap smears.  Discussed current screening guidelines and we both agree to stop screening based on age and hysterectomy history. 6. Health maintenance.  No routine lab work done as patient does this elsewhere.  Follow-up 1 year, sooner as needed.   Anastasio Auerbach MD, 3:19 PM 04/05/2017

## 2017-04-05 NOTE — Patient Instructions (Signed)
Follow-up in 1 year for annual exam, sooner if any issues. 

## 2017-04-17 DIAGNOSIS — E039 Hypothyroidism, unspecified: Secondary | ICD-10-CM | POA: Diagnosis not present

## 2017-04-17 DIAGNOSIS — I10 Essential (primary) hypertension: Secondary | ICD-10-CM | POA: Diagnosis not present

## 2017-04-17 DIAGNOSIS — E782 Mixed hyperlipidemia: Secondary | ICD-10-CM | POA: Diagnosis not present

## 2017-04-17 DIAGNOSIS — M810 Age-related osteoporosis without current pathological fracture: Secondary | ICD-10-CM | POA: Diagnosis not present

## 2017-04-17 DIAGNOSIS — E669 Obesity, unspecified: Secondary | ICD-10-CM | POA: Diagnosis not present

## 2017-06-03 DIAGNOSIS — S8011XA Contusion of right lower leg, initial encounter: Secondary | ICD-10-CM | POA: Diagnosis not present

## 2017-08-01 DIAGNOSIS — L821 Other seborrheic keratosis: Secondary | ICD-10-CM | POA: Diagnosis not present

## 2017-08-01 DIAGNOSIS — L814 Other melanin hyperpigmentation: Secondary | ICD-10-CM | POA: Diagnosis not present

## 2017-08-01 DIAGNOSIS — Z85828 Personal history of other malignant neoplasm of skin: Secondary | ICD-10-CM | POA: Diagnosis not present

## 2017-08-01 DIAGNOSIS — L570 Actinic keratosis: Secondary | ICD-10-CM | POA: Diagnosis not present

## 2017-08-01 DIAGNOSIS — D225 Melanocytic nevi of trunk: Secondary | ICD-10-CM | POA: Diagnosis not present

## 2017-09-06 DIAGNOSIS — H52203 Unspecified astigmatism, bilateral: Secondary | ICD-10-CM | POA: Diagnosis not present

## 2017-09-06 DIAGNOSIS — H43813 Vitreous degeneration, bilateral: Secondary | ICD-10-CM | POA: Diagnosis not present

## 2017-09-06 DIAGNOSIS — H02834 Dermatochalasis of left upper eyelid: Secondary | ICD-10-CM | POA: Diagnosis not present

## 2017-09-06 DIAGNOSIS — H02831 Dermatochalasis of right upper eyelid: Secondary | ICD-10-CM | POA: Diagnosis not present

## 2017-10-16 DIAGNOSIS — J040 Acute laryngitis: Secondary | ICD-10-CM | POA: Diagnosis not present

## 2017-10-16 DIAGNOSIS — J321 Chronic frontal sinusitis: Secondary | ICD-10-CM | POA: Diagnosis not present

## 2017-10-16 DIAGNOSIS — J320 Chronic maxillary sinusitis: Secondary | ICD-10-CM | POA: Diagnosis not present

## 2017-10-18 DIAGNOSIS — B029 Zoster without complications: Secondary | ICD-10-CM | POA: Diagnosis not present

## 2017-10-18 DIAGNOSIS — Z85828 Personal history of other malignant neoplasm of skin: Secondary | ICD-10-CM | POA: Diagnosis not present

## 2017-11-12 DIAGNOSIS — E782 Mixed hyperlipidemia: Secondary | ICD-10-CM | POA: Diagnosis not present

## 2017-11-12 DIAGNOSIS — E669 Obesity, unspecified: Secondary | ICD-10-CM | POA: Diagnosis not present

## 2017-11-12 DIAGNOSIS — E039 Hypothyroidism, unspecified: Secondary | ICD-10-CM | POA: Diagnosis not present

## 2017-11-12 DIAGNOSIS — M810 Age-related osteoporosis without current pathological fracture: Secondary | ICD-10-CM | POA: Diagnosis not present

## 2017-11-12 DIAGNOSIS — I10 Essential (primary) hypertension: Secondary | ICD-10-CM | POA: Diagnosis not present

## 2017-11-12 DIAGNOSIS — M150 Primary generalized (osteo)arthritis: Secondary | ICD-10-CM | POA: Diagnosis not present

## 2017-11-20 ENCOUNTER — Other Ambulatory Visit: Payer: Self-pay | Admitting: *Deleted

## 2017-11-20 ENCOUNTER — Telehealth: Payer: Self-pay | Admitting: *Deleted

## 2017-11-20 DIAGNOSIS — M818 Other osteoporosis without current pathological fracture: Secondary | ICD-10-CM

## 2017-11-20 MED ORDER — VALACYCLOVIR HCL 500 MG PO TABS: 500.0000 mg | ORAL_TABLET | Freq: Every day | ORAL | 5 refills | Status: DC

## 2017-11-20 NOTE — Telephone Encounter (Signed)
Rx called in, pt aware 

## 2017-11-20 NOTE — Telephone Encounter (Signed)
If she is going for zoster prevention I think is unclear as to what the proper dose is.  I would start at Valtrex 500 mg daily and if she seems to breakthrough this with a recurrent shingles we could increase her to 1000.  Valtrex 500 mg #30 refill x5 1 p.o. daily

## 2017-11-20 NOTE — Telephone Encounter (Signed)
Patient called requesting refill on acyclovir 400 mg, has not had an outbreak, but just got over the shingles and was told by her PCP that she may want to start back on the acyclovir. If you agree pt will need a new Rx. Please advise

## 2017-11-21 ENCOUNTER — Telehealth: Payer: Self-pay

## 2017-11-21 MED ORDER — ACYCLOVIR 400 MG PO TABS: 400.0000 mg | ORAL_TABLET | Freq: Every day | ORAL | 1 refills | Status: DC

## 2017-11-21 NOTE — Telephone Encounter (Signed)
Patient called yesterday and you prescribed Valcyclovir. She called today and asked if you could prescribe acyclovir instead as the Valcyclovir is very expensive.

## 2017-11-21 NOTE — Telephone Encounter (Signed)
Patient advised. She requested 90 day supply Rx as is more cost efficient for her.  Rx sent.

## 2017-11-21 NOTE — Telephone Encounter (Signed)
Okay.  Acyclovir itself is a little less well studied from a prophylactic standpoint but should do okay.  400 mg daily #30 refill x6

## 2018-01-21 DIAGNOSIS — Z23 Encounter for immunization: Secondary | ICD-10-CM | POA: Diagnosis not present

## 2018-02-10 DIAGNOSIS — Z1231 Encounter for screening mammogram for malignant neoplasm of breast: Secondary | ICD-10-CM | POA: Diagnosis not present

## 2018-04-01 ENCOUNTER — Ambulatory Visit (INDEPENDENT_AMBULATORY_CARE_PROVIDER_SITE_OTHER): Payer: Medicare Other

## 2018-04-01 DIAGNOSIS — M818 Other osteoporosis without current pathological fracture: Secondary | ICD-10-CM

## 2018-04-01 DIAGNOSIS — M810 Age-related osteoporosis without current pathological fracture: Secondary | ICD-10-CM | POA: Diagnosis not present

## 2018-04-03 ENCOUNTER — Encounter: Payer: Self-pay | Admitting: Gynecology

## 2018-04-03 ENCOUNTER — Other Ambulatory Visit: Payer: Self-pay | Admitting: Gynecology

## 2018-04-03 DIAGNOSIS — M810 Age-related osteoporosis without current pathological fracture: Secondary | ICD-10-CM

## 2018-04-07 ENCOUNTER — Encounter: Payer: Medicare Other | Admitting: Gynecology

## 2018-05-07 ENCOUNTER — Other Ambulatory Visit: Payer: Self-pay | Admitting: Gynecology

## 2018-05-15 DIAGNOSIS — I10 Essential (primary) hypertension: Secondary | ICD-10-CM | POA: Diagnosis not present

## 2018-05-15 DIAGNOSIS — M150 Primary generalized (osteo)arthritis: Secondary | ICD-10-CM | POA: Diagnosis not present

## 2018-05-15 DIAGNOSIS — Z23 Encounter for immunization: Secondary | ICD-10-CM | POA: Diagnosis not present

## 2018-05-15 DIAGNOSIS — E669 Obesity, unspecified: Secondary | ICD-10-CM | POA: Diagnosis not present

## 2018-05-15 DIAGNOSIS — E782 Mixed hyperlipidemia: Secondary | ICD-10-CM | POA: Diagnosis not present

## 2018-05-15 DIAGNOSIS — M810 Age-related osteoporosis without current pathological fracture: Secondary | ICD-10-CM | POA: Diagnosis not present

## 2018-05-15 DIAGNOSIS — E039 Hypothyroidism, unspecified: Secondary | ICD-10-CM | POA: Diagnosis not present

## 2018-06-06 ENCOUNTER — Encounter: Payer: Medicare Other | Admitting: Gynecology

## 2018-06-09 ENCOUNTER — Encounter: Payer: Self-pay | Admitting: Gynecology

## 2018-06-09 ENCOUNTER — Ambulatory Visit (INDEPENDENT_AMBULATORY_CARE_PROVIDER_SITE_OTHER): Payer: Medicare Other | Admitting: Gynecology

## 2018-06-09 VITALS — BP 124/84 | Ht 63.0 in | Wt 190.0 lb

## 2018-06-09 DIAGNOSIS — B9689 Other specified bacterial agents as the cause of diseases classified elsewhere: Secondary | ICD-10-CM | POA: Diagnosis not present

## 2018-06-09 DIAGNOSIS — Z01411 Encounter for gynecological examination (general) (routine) with abnormal findings: Secondary | ICD-10-CM

## 2018-06-09 DIAGNOSIS — N952 Postmenopausal atrophic vaginitis: Secondary | ICD-10-CM

## 2018-06-09 DIAGNOSIS — Z9189 Other specified personal risk factors, not elsewhere classified: Secondary | ICD-10-CM

## 2018-06-09 DIAGNOSIS — M810 Age-related osteoporosis without current pathological fracture: Secondary | ICD-10-CM | POA: Diagnosis not present

## 2018-06-09 DIAGNOSIS — Z9289 Personal history of other medical treatment: Secondary | ICD-10-CM | POA: Diagnosis not present

## 2018-06-09 DIAGNOSIS — R21 Rash and other nonspecific skin eruption: Secondary | ICD-10-CM | POA: Diagnosis not present

## 2018-06-09 DIAGNOSIS — N760 Acute vaginitis: Secondary | ICD-10-CM

## 2018-06-09 LAB — WET PREP FOR TRICH, YEAST, CLUE

## 2018-06-09 MED ORDER — METRONIDAZOLE 0.75 % VA GEL: 1.0000 | Freq: Every day | VAGINAL | 0 refills | Status: DC

## 2018-06-09 MED ORDER — NYSTATIN 100000 UNIT/GM EX POWD: Freq: Four times a day (QID) | CUTANEOUS | 0 refills | Status: DC

## 2018-06-09 NOTE — Progress Notes (Signed)
    Kimberly Webb Sep 14, 1932 [id number]        85 y.o.  G2P2002 for breast and pelvic exam.  Complaining of a rash under her breasts that is irritative.  Been present for several months.  Also notes vaginal irritation with discharge and slight odor.  No urinary symptoms such as frequency dysuria urgency low back pain fever or chills.  Past medical history,surgical history, problem list, medications, allergies, family history and social history were all reviewed and documented as reviewed in the EPIC chart.  ROS:  Performed with pertinent positives and negatives included in the history, assessment and plan.   Additional significant findings : None   Exam: Caryn Bee assistant Vitals:   06/09/18 1027  BP: 124/84  Weight: 190 lb (86.2 kg)  Height: 5\' 3"  (1.6 m)   Body mass index is 33.66 kg/m.  General appearance:  Normal affect, orientation and appearance. Skin: Grossly normal HEENT: Without gross lesions.  No cervical or supraclavicular adenopathy. Thyroid normal.  Lungs:  Clear without wheezing, rales or rhonchi Cardiac: RR, without RMG Abdominal:  Soft, nontender, without masses, guarding, rebound, organomegaly or hernia Breasts:  Examined lying and sitting without masses, retractions, discharge or axillary adenopathy.  Classic fungal rash under both breasts. Pelvic:  Ext, BUS, Vagina: With atrophic changes.  White discharge noted  Adnexa: Without masses or tenderness    Anus and perineum: Normal   Rectovaginal: Normal sphincter tone without palpated masses or tenderness.    Assessment/Plan:  85 y.o. [id number] female for breast and pelvic exam.   1. Postmenopausal.  Status post TVH/BSO anterior colporrhaphy by Dr. Toney Rakes.  No significant menopausal symptoms. 2. Vaginal discharge with irritation.  Wet prep is positive for bacterial vaginosis.  Options for management reviewed.  Will prescribe MetroGel nightly x7 nights.  Follow-up if symptoms persist, worsen or  recur. 3. Fungal rash under both breasts.  Nystatin powder prescribed to use up to 4 times daily as needed.  Follow-up if symptoms persist. 4. Osteoporosis.  Being treated by Dr. Alroy Dust with Prolia.  T score -2.2 with 2017 DEXA.  Most recent DEXA 03/2018 T score -1.4.  Will forward a report to Dr. Alroy Dust.  She will continue to follow-up with him in reference to bone health management. 5. Colonoscopy reported 10 years ago.  Will follow-up with Dr. Alroy Dust as far as colon screening recommendations. 6. Mammography 02/2018.  Continue with annual mammogram when due.  Breast exam normal today. 7. Pap smear 2012.  No Pap smear done today.  No history of abnormal Pap smears.  Per current screening guidelines based on age and hysterectomy history we both agree to stop screening. 8. Health maintenance.  No routine lab work done as patient does this elsewhere.  Follow-up 1 year, sooner as needed.  Additional time in excess of her breast and pelvic exam was spent in direct face to face counseling and coordination of care in regards to her skin rash and vaginitis.    Anastasio Auerbach MD, 11:15 AM 06/09/2018

## 2018-06-09 NOTE — Patient Instructions (Signed)
Apply the prescribed powder under both breasts several times daily to take care of the rash.  Use the prescribed vaginal cream nightly for 7 nights to treat the vaginal infection.

## 2018-06-09 NOTE — Addendum Note (Signed)
Addended by: Nelva Nay on: 06/09/2018 12:56 PM   Modules accepted: Orders

## 2018-06-17 IMAGING — CR DG CHEST 2V
2 series · 2 of 2 positions shown · non-contrast
Comparison: Radiograph May 25, 2008.

CLINICAL DATA: Persistent cough.

EXAM:
CHEST  2 VIEW

[w chest pa]
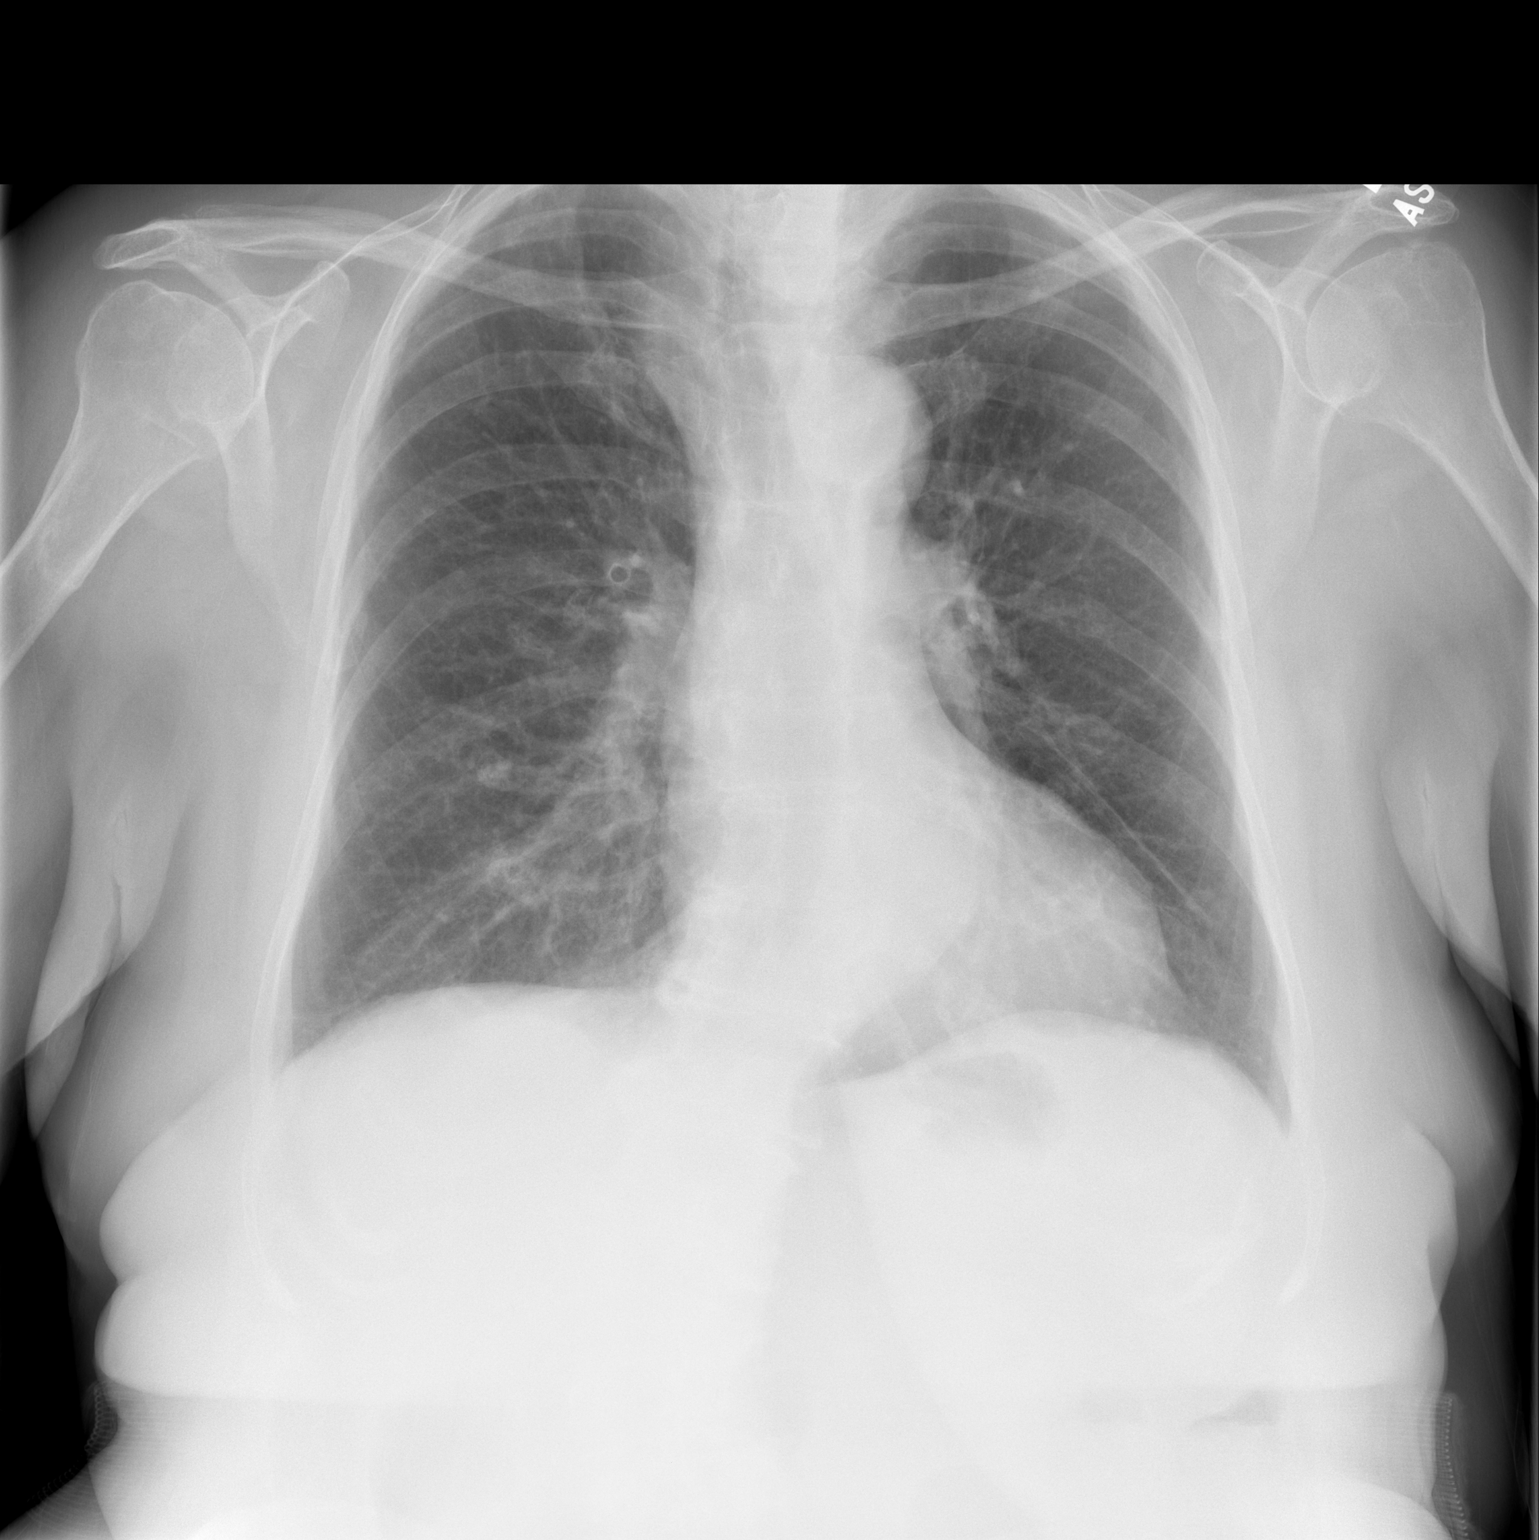

[w chest lat]
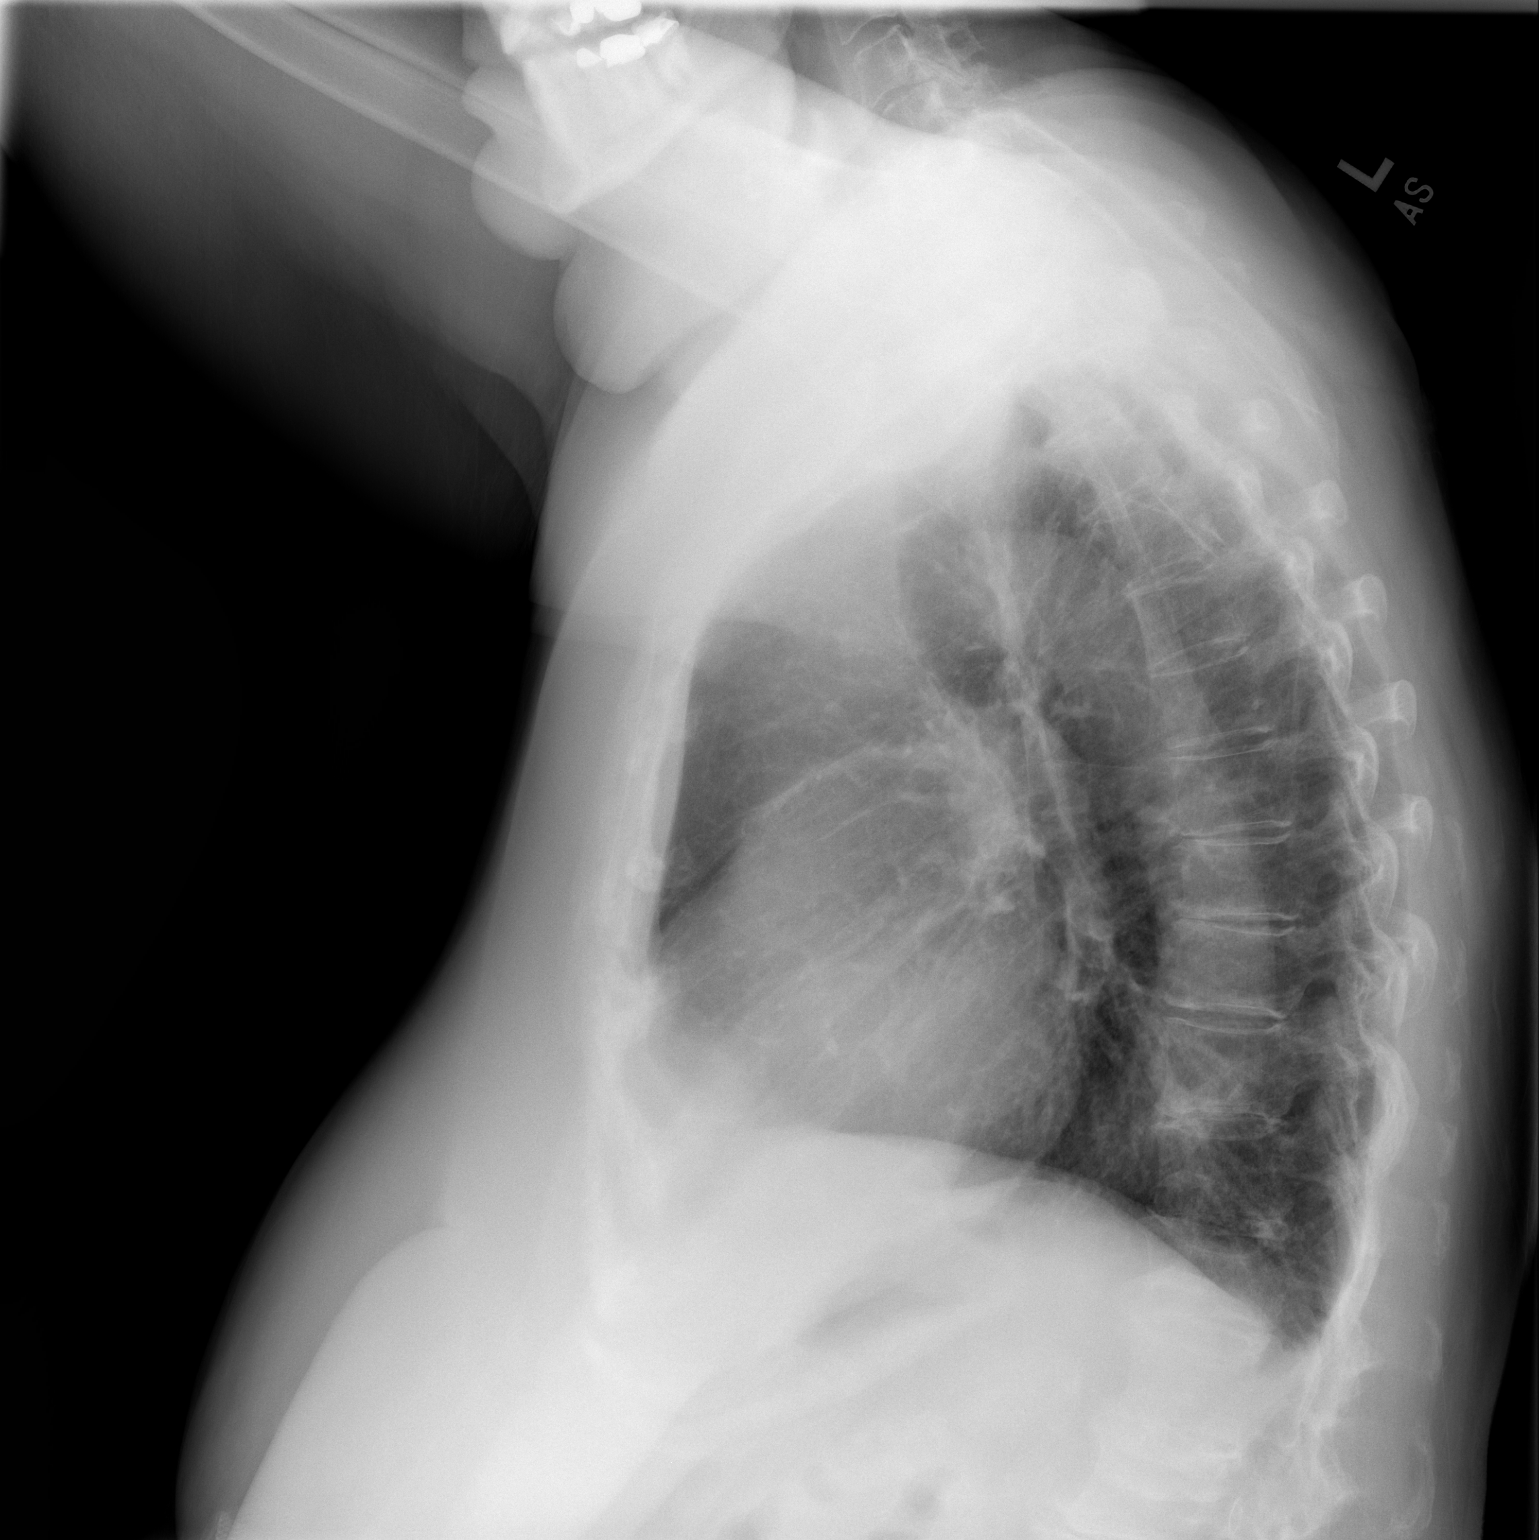

[2 of 2 positions shown; findings below may reference images not displayed]

FINDINGS: The heart size and mediastinal contours are within normal limits.
Both lungs are clear. No pneumothorax or pleural effusion is noted.
The visualized skeletal structures are unremarkable.
IMPRESSION: No active cardiopulmonary disease.

## 2018-07-28 DIAGNOSIS — M25561 Pain in right knee: Secondary | ICD-10-CM | POA: Diagnosis not present

## 2018-07-28 DIAGNOSIS — M25562 Pain in left knee: Secondary | ICD-10-CM | POA: Diagnosis not present

## 2018-07-28 DIAGNOSIS — M1711 Unilateral primary osteoarthritis, right knee: Secondary | ICD-10-CM | POA: Diagnosis not present

## 2018-08-01 DIAGNOSIS — D2261 Melanocytic nevi of right upper limb, including shoulder: Secondary | ICD-10-CM | POA: Diagnosis not present

## 2018-08-01 DIAGNOSIS — D2262 Melanocytic nevi of left upper limb, including shoulder: Secondary | ICD-10-CM | POA: Diagnosis not present

## 2018-08-01 DIAGNOSIS — L570 Actinic keratosis: Secondary | ICD-10-CM | POA: Diagnosis not present

## 2018-08-01 DIAGNOSIS — D045 Carcinoma in situ of skin of trunk: Secondary | ICD-10-CM | POA: Diagnosis not present

## 2018-08-01 DIAGNOSIS — D225 Melanocytic nevi of trunk: Secondary | ICD-10-CM | POA: Diagnosis not present

## 2018-08-01 DIAGNOSIS — D1801 Hemangioma of skin and subcutaneous tissue: Secondary | ICD-10-CM | POA: Diagnosis not present

## 2018-08-01 DIAGNOSIS — Z85828 Personal history of other malignant neoplasm of skin: Secondary | ICD-10-CM | POA: Diagnosis not present

## 2018-08-01 DIAGNOSIS — D224 Melanocytic nevi of scalp and neck: Secondary | ICD-10-CM | POA: Diagnosis not present

## 2018-08-01 DIAGNOSIS — L821 Other seborrheic keratosis: Secondary | ICD-10-CM | POA: Diagnosis not present

## 2018-08-01 DIAGNOSIS — L218 Other seborrheic dermatitis: Secondary | ICD-10-CM | POA: Diagnosis not present

## 2018-08-11 DIAGNOSIS — D045 Carcinoma in situ of skin of trunk: Secondary | ICD-10-CM | POA: Diagnosis not present

## 2018-08-11 DIAGNOSIS — Z85828 Personal history of other malignant neoplasm of skin: Secondary | ICD-10-CM | POA: Diagnosis not present

## 2018-10-17 DIAGNOSIS — H43813 Vitreous degeneration, bilateral: Secondary | ICD-10-CM | POA: Diagnosis not present

## 2018-10-17 DIAGNOSIS — H52203 Unspecified astigmatism, bilateral: Secondary | ICD-10-CM | POA: Diagnosis not present

## 2018-10-17 DIAGNOSIS — H524 Presbyopia: Secondary | ICD-10-CM | POA: Diagnosis not present

## 2018-10-17 DIAGNOSIS — Z961 Presence of intraocular lens: Secondary | ICD-10-CM | POA: Diagnosis not present

## 2018-11-19 DIAGNOSIS — M25561 Pain in right knee: Secondary | ICD-10-CM | POA: Diagnosis not present

## 2018-11-19 DIAGNOSIS — M1711 Unilateral primary osteoarthritis, right knee: Secondary | ICD-10-CM | POA: Diagnosis not present

## 2018-12-01 DIAGNOSIS — E039 Hypothyroidism, unspecified: Secondary | ICD-10-CM | POA: Diagnosis not present

## 2018-12-01 DIAGNOSIS — M810 Age-related osteoporosis without current pathological fracture: Secondary | ICD-10-CM | POA: Diagnosis not present

## 2018-12-01 DIAGNOSIS — E669 Obesity, unspecified: Secondary | ICD-10-CM | POA: Diagnosis not present

## 2018-12-01 DIAGNOSIS — E782 Mixed hyperlipidemia: Secondary | ICD-10-CM | POA: Diagnosis not present

## 2018-12-01 DIAGNOSIS — M150 Primary generalized (osteo)arthritis: Secondary | ICD-10-CM | POA: Diagnosis not present

## 2018-12-01 DIAGNOSIS — I10 Essential (primary) hypertension: Secondary | ICD-10-CM | POA: Diagnosis not present

## 2019-02-03 DIAGNOSIS — Z23 Encounter for immunization: Secondary | ICD-10-CM | POA: Diagnosis not present

## 2019-02-16 ENCOUNTER — Encounter: Payer: Self-pay | Admitting: Gynecology

## 2019-02-16 DIAGNOSIS — Z1231 Encounter for screening mammogram for malignant neoplasm of breast: Secondary | ICD-10-CM | POA: Diagnosis not present

## 2019-03-02 DIAGNOSIS — M25561 Pain in right knee: Secondary | ICD-10-CM | POA: Diagnosis not present

## 2019-03-02 DIAGNOSIS — M1711 Unilateral primary osteoarthritis, right knee: Secondary | ICD-10-CM | POA: Diagnosis not present

## 2019-03-12 ENCOUNTER — Encounter: Payer: Self-pay | Admitting: Gynecology

## 2019-05-05 ENCOUNTER — Other Ambulatory Visit: Payer: Self-pay | Admitting: Gynecology

## 2019-06-10 DIAGNOSIS — E039 Hypothyroidism, unspecified: Secondary | ICD-10-CM | POA: Diagnosis not present

## 2019-06-10 DIAGNOSIS — I10 Essential (primary) hypertension: Secondary | ICD-10-CM | POA: Diagnosis not present

## 2019-06-10 DIAGNOSIS — R7309 Other abnormal glucose: Secondary | ICD-10-CM | POA: Diagnosis not present

## 2019-06-10 DIAGNOSIS — E669 Obesity, unspecified: Secondary | ICD-10-CM | POA: Diagnosis not present

## 2019-06-10 DIAGNOSIS — M810 Age-related osteoporosis without current pathological fracture: Secondary | ICD-10-CM | POA: Diagnosis not present

## 2019-06-10 DIAGNOSIS — E782 Mixed hyperlipidemia: Secondary | ICD-10-CM | POA: Diagnosis not present

## 2019-06-10 DIAGNOSIS — M150 Primary generalized (osteo)arthritis: Secondary | ICD-10-CM | POA: Diagnosis not present

## 2019-06-11 ENCOUNTER — Encounter: Payer: Medicare Other | Admitting: Gynecology

## 2019-06-15 ENCOUNTER — Other Ambulatory Visit: Payer: Self-pay

## 2019-06-15 DIAGNOSIS — I10 Essential (primary) hypertension: Secondary | ICD-10-CM | POA: Diagnosis not present

## 2019-06-15 DIAGNOSIS — R7309 Other abnormal glucose: Secondary | ICD-10-CM | POA: Diagnosis not present

## 2019-06-15 MED ORDER — ACYCLOVIR 400 MG PO TABS: 400.0000 mg | ORAL_TABLET | Freq: Every day | ORAL | 0 refills | Status: DC

## 2019-06-25 ENCOUNTER — Ambulatory Visit: Payer: Medicare Other | Attending: Internal Medicine

## 2019-06-25 DIAGNOSIS — Z23 Encounter for immunization: Secondary | ICD-10-CM | POA: Insufficient documentation

## 2019-06-25 NOTE — Progress Notes (Signed)
   Covid-19 Vaccination Clinic  Name:  Kimberly Webb    MRN: [id number] DOB: [birth year]  06/25/2019  Kimberly Webb was observed post Covid-19 immunization for 15 minutes without incidence. She was provided with Vaccine Information Sheet and instruction to access the V-Safe system.   Kimberly Webb was instructed to call 911 with any severe reactions post vaccine: [name] Kitchen Difficulty breathing  . Swelling of your face and throat  . A fast heartbeat  . A bad rash all over your body  . Dizziness and weakness    Immunizations Administered    Name Date Dose VIS Date Route   Pfizer COVID-19 Vaccine 06/25/2019 12:21 PM 0.3 mL 05/15/2019 Intramuscular   Manufacturer: [place]   Lot: [id number]   [place]: [id number]

## 2019-06-29 ENCOUNTER — Other Ambulatory Visit: Payer: Self-pay

## 2019-06-29 ENCOUNTER — Encounter: Payer: Self-pay | Admitting: Obstetrics and Gynecology

## 2019-06-29 ENCOUNTER — Ambulatory Visit (INDEPENDENT_AMBULATORY_CARE_PROVIDER_SITE_OTHER): Payer: Medicare Other | Admitting: Obstetrics and Gynecology

## 2019-06-29 VITALS — BP 140/74 | Ht 62.0 in | Wt 186.0 lb

## 2019-06-29 DIAGNOSIS — Z01419 Encounter for gynecological examination (general) (routine) without abnormal findings: Secondary | ICD-10-CM

## 2019-06-29 DIAGNOSIS — Z01411 Encounter for gynecological examination (general) (routine) with abnormal findings: Secondary | ICD-10-CM

## 2019-06-29 DIAGNOSIS — M810 Age-related osteoporosis without current pathological fracture: Secondary | ICD-10-CM

## 2019-06-29 MED ORDER — ACYCLOVIR 400 MG PO TABS: 400.0000 mg | ORAL_TABLET | Freq: Every day | ORAL | 3 refills | Status: DC

## 2019-06-29 NOTE — Progress Notes (Signed)
JAZZMON AVELLA 1933/03/20 [id number]  SUBJECTIVE:  86 y.o. G2P2002 female for annual routine gynecologic exam.. She has no gynecologic concerns.  Current Outpatient Medications  Medication Sig Dispense Refill  . acyclovir (ZOVIRAX) 400 MG tablet Take 1 tablet (400 mg total) by mouth daily. 90 tablet 0  . amLODipine (NORVASC) 5 MG tablet Take 5 mg by mouth daily.      [name] Kitchen amoxicillin (AMOXIL) 500 MG capsule Take 500 mg by mouth 4 (four) times daily as needed. Take with dental procedures    . aspirin 81 MG tablet Take 81 mg by mouth daily.      [name] Kitchen denosumab (PROLIA) 60 MG/ML SOLN injection Inject 60 mg into the skin every 6 (six) months. Administer in upper arm, thigh, or abdomen    . levothyroxine (SYNTHROID, LEVOTHROID) 100 MCG tablet Take 100 mcg by mouth daily.      . Multiple Vitamin (MULTIVITAMIN) capsule Take 1 capsule by mouth daily.      . quinapril (ACCUPRIL) 40 MG tablet Take 40 mg by mouth at bedtime.      . simvastatin (ZOCOR) 40 MG tablet Take 40 mg by mouth at bedtime.      . SPIRONOLACTONE-HCTZ PO Take by mouth.      . metroNIDAZOLE (METROGEL) 0.75 % vaginal gel Place 1 Applicatorful vaginally at bedtime. (Patient not taking: Reported on 06/29/2019) 70 g 0  . nystatin (MYCOSTATIN/NYSTOP) powder Apply topically 4 (four) times daily. (Patient not taking: Reported on 06/29/2019) 15 g 0   No current facility-administered medications for this visit.   Allergies: Patient has no known allergies.  No LMP recorded. Patient has had a hysterectomy.  Past medical history,surgical history, problem list, medications, allergies, family history and social history were all reviewed and documented as reviewed in the EPIC chart.  ROS:  Feeling well. No dyspnea or chest pain on exertion.  No abdominal pain, change in bowel habits, black or bloody stools.  No urinary tract symptoms. GYN ROS: no abnormal bleeding, pelvic pain or discharge, no breast pain or new or enlarging lumps on self exam. No  neurological complaints.    OBJECTIVE:  Ht 5\' 2"  (1.575 m)   Wt 186 lb (84.4 kg)   BMI 34.02 kg/m  The patient appears well, alert, oriented x 3, in no distress. ENT normal.  Neck supple. No cervical or supraclavicular adenopathy or thyromegaly.  Lungs are clear, good air entry, no wheezes, rhonchi or rales. S1 and S2 normal, no murmurs, regular rate and rhythm.  Abdomen soft without tenderness, guarding, mass or organomegaly.  Neurological is normal, no focal findings.  BREAST EXAM: breasts appear normal, no suspicious masses, no skin or nipple changes or axillary nodes. Raw red skin with white exudate present under medial right breast in skin fold. +Seborrheic keratoses near sternal area and medial bilateral breasts.  PELVIC EXAM: VULVA: normal appearing vulva with no masses, tenderness or lesions,+ atrophic changes, diffusely erythematous appearance of bilateral vulva without excoriation, exudate, or masses. No sign of HSV lesions. VAGINA: normal appearing vagina with normal color and discharge, no lesions, +atrophy. CERVIX: surgically absent, UTERUS: surgically absent, vaginal cuff well healed, ADNEXA: normal adnexa in size, nontender and no masses, no masses  Chaperone: Caryn Bee present during the examination  ASSESSMENT:  86 y.o. [id number] here for annual gynecologic exam  PLAN:  1. Postmenopausal. No gynecologic concerns. S/p TVH BSO with anterior repair in the past. 2. Pap smear 2012.  No prior abnormal history.  She continues to  be comfortable with holding off on Pap smear from this point forward. 3. Mammogram. Will continue with annual mammography. Breast exam normal today.  Mammary fold rash to be evaluated by dermatology soon this upcoming week per patient as empiric Nystatin treatment was not sufficient in the past. 4. Colonoscopy 10 years ago or more. Recommended that she continue per the prescribed interval per the guidance of her primary care physician, Dr. Alroy Dust. 5.  Osteoporosis.  T score -2.2 with 2017 DEXA.  Most recent DEXA 03/2018 T score -1.4.  Following with her primary care physician regarding management and treatment. 6. History of genital HSV. She would like to remain on a prophylactic dose of acyclovir and I did refill this for her today. 7. Health maintenance.  No lab work as she has this completed with her primary care provider.    Return annually or prn.  Larey Days MD  06/29/19

## 2019-06-29 NOTE — Patient Instructions (Signed)
It was nice to meet you today! Please remember to schedule your annual mammogram this year.

## 2019-07-15 ENCOUNTER — Ambulatory Visit: Payer: 59

## 2019-07-16 ENCOUNTER — Ambulatory Visit: Payer: Medicare Other | Attending: Internal Medicine

## 2019-07-16 DIAGNOSIS — Z23 Encounter for immunization: Secondary | ICD-10-CM | POA: Insufficient documentation

## 2019-07-16 NOTE — Progress Notes (Signed)
   Covid-19 Vaccination Clinic  Name:  Kimberly Webb    MRN: [id number] DOB: 01-10-1933  07/16/2019  Ms. Kimberly Webb was observed post Covid-19 immunization for 15 minutes without incidence. She was provided with Vaccine Information Sheet and instruction to access the V-Safe system.   Ms. Kimberly Webb was instructed to call 911 with any severe reactions post vaccine: [name] Kitchen Difficulty breathing  . Swelling of your face and throat  . A fast heartbeat  . A bad rash all over your body  . Dizziness and weakness    Immunizations Administered    Name Date Dose VIS Date Route   Pfizer COVID-19 Vaccine 07/16/2019 12:29 PM 0.3 mL 05/15/2019 Intramuscular   Manufacturer: [place]   Lot: [id number]   [place]: [id number]

## 2019-07-20 ENCOUNTER — Emergency Department (HOSPITAL_COMMUNITY): Payer: Medicare Other

## 2019-07-20 ENCOUNTER — Encounter (HOSPITAL_COMMUNITY): Payer: Self-pay | Admitting: Emergency Medicine

## 2019-07-20 ENCOUNTER — Other Ambulatory Visit: Payer: Self-pay

## 2019-07-20 ENCOUNTER — Inpatient Hospital Stay (HOSPITAL_COMMUNITY)
Admission: EM | Admit: 2019-07-20 | Discharge: 2019-07-22 | DRG: 552 | Disposition: A | Discharge status: Home Health | Payer: Medicare Other | Attending: Internal Medicine | Admitting: Internal Medicine

## 2019-07-20 DIAGNOSIS — I10 Essential (primary) hypertension: Secondary | ICD-10-CM | POA: Diagnosis not present

## 2019-07-20 DIAGNOSIS — Z90710 Acquired absence of both cervix and uterus: Secondary | ICD-10-CM

## 2019-07-20 DIAGNOSIS — M810 Age-related osteoporosis without current pathological fracture: Secondary | ICD-10-CM | POA: Diagnosis present

## 2019-07-20 DIAGNOSIS — R52 Pain, unspecified: Secondary | ICD-10-CM | POA: Diagnosis not present

## 2019-07-20 DIAGNOSIS — S12030A Displaced posterior arch fracture of first cervical vertebra, initial encounter for closed fracture: Secondary | ICD-10-CM | POA: Diagnosis not present

## 2019-07-20 DIAGNOSIS — Z20822 Contact with and (suspected) exposure to covid-19: Secondary | ICD-10-CM | POA: Diagnosis present

## 2019-07-20 DIAGNOSIS — M542 Cervicalgia: Secondary | ICD-10-CM | POA: Diagnosis not present

## 2019-07-20 DIAGNOSIS — S12000A Unspecified displaced fracture of first cervical vertebra, initial encounter for closed fracture: Secondary | ICD-10-CM | POA: Diagnosis not present

## 2019-07-20 DIAGNOSIS — S0990XA Unspecified injury of head, initial encounter: Secondary | ICD-10-CM | POA: Diagnosis not present

## 2019-07-20 DIAGNOSIS — S0993XA Unspecified injury of face, initial encounter: Secondary | ICD-10-CM | POA: Diagnosis not present

## 2019-07-20 DIAGNOSIS — Z79890 Hormone replacement therapy (postmenopausal): Secondary | ICD-10-CM

## 2019-07-20 DIAGNOSIS — Z79899 Other long term (current) drug therapy: Secondary | ICD-10-CM

## 2019-07-20 DIAGNOSIS — E039 Hypothyroidism, unspecified: Secondary | ICD-10-CM | POA: Diagnosis not present

## 2019-07-20 DIAGNOSIS — R609 Edema, unspecified: Secondary | ICD-10-CM | POA: Diagnosis not present

## 2019-07-20 DIAGNOSIS — S12100A Unspecified displaced fracture of second cervical vertebra, initial encounter for closed fracture: Secondary | ICD-10-CM

## 2019-07-20 DIAGNOSIS — M1990 Unspecified osteoarthritis, unspecified site: Secondary | ICD-10-CM | POA: Diagnosis present

## 2019-07-20 DIAGNOSIS — Y92009 Unspecified place in unspecified non-institutional (private) residence as the place of occurrence of the external cause: Secondary | ICD-10-CM

## 2019-07-20 DIAGNOSIS — E7800 Pure hypercholesterolemia, unspecified: Secondary | ICD-10-CM | POA: Diagnosis present

## 2019-07-20 DIAGNOSIS — Z96652 Presence of left artificial knee joint: Secondary | ICD-10-CM | POA: Diagnosis present

## 2019-07-20 DIAGNOSIS — Z87891 Personal history of nicotine dependence: Secondary | ICD-10-CM

## 2019-07-20 DIAGNOSIS — S129XXA Fracture of neck, unspecified, initial encounter: Secondary | ICD-10-CM | POA: Diagnosis present

## 2019-07-20 DIAGNOSIS — W010XXA Fall on same level from slipping, tripping and stumbling without subsequent striking against object, initial encounter: Secondary | ICD-10-CM | POA: Diagnosis present

## 2019-07-20 MED ORDER — MIDAZOLAM HCL 2 MG/2ML IJ SOLN
2.0000 mg | Freq: Once | INTRAMUSCULAR | Status: AC
  Administered 2019-07-20: 2 mg via INTRAVENOUS
  Filled 2019-07-20: qty 2

## 2019-07-20 MED ORDER — IBUPROFEN 400 MG PO TABS
600.0000 mg | ORAL_TABLET | Freq: Once | ORAL | Status: AC
  Administered 2019-07-20: 600 mg via ORAL
  Filled 2019-07-20: qty 1

## 2019-07-20 NOTE — ED Notes (Signed)
Pt not in room at this time. Will return later

## 2019-07-20 NOTE — ED Triage Notes (Signed)
Pt arrives via EMS from home with reports of falling last night around 5 pm. Neck pain worsened today. Towel around neck due to c-collar causing pain.

## 2019-07-20 NOTE — ED Provider Notes (Signed)
[facility] DEPARTMENT Provider Note   CSN: [id number] Arrival date & time: 07/20/19  1814     History Chief Complaint  Patient presents with  . Fall    Kimberly Webb is a 86 y.o. female history of arthritis presenting to the emergency department with mechanical fall yesterday and neck pain.  She reports he tripped over her her chair yesterday and fell backwards.  She struck her head on the ground.  She has been having pain in her neck since then.  She denies any numbness or weakness in arms or legs.  She reports a mild persistent headache in the back of her head.  She is not on blood thinners.    HPI     Past Medical History:  Diagnosis Date  . Arthritis   . Atrophic vaginitis   . Cystocele   . Fibroid   . Hypercholesteremia   . Hypertension   . Hypothyroidism   . Osteoporosis   . Paratubal cyst   . Rectocele   . STD (sexually transmitted disease)    HSV  . Uterine prolapse     Patient Active Problem List   Diagnosis Date Noted  . C1 cervical fracture ([place]) 07/21/2019  . Vaginal discharge 09/07/2016  . Menopause 09/06/2016  . History of herpes genitalis 04/04/2016  . Skin mole 04/01/2013  . STD (sexually transmitted disease)   . Rectocele   . Atrophic vaginitis   . Osteoporosis   . Hypercholesteremia   . Arthritis   . Hypothyroidism     Past Surgical History:  Procedure Laterality Date  . BROKEN ANKLE    . BROKEN FEMUR    . CATARACT EXTRACTION Bilateral   . FOOT SURGERY Right    TOE  . HERNIA REPAIR  2009  . OOPHORECTOMY     BSO WITH VAG HYST IN 2004  . REPLACEMENT TOTAL KNEE  2011   LEFT X 2  . THYROIDECTOMY, PARTIAL    . VAGINAL HYSTERECTOMY  2004   VAG HYST, BSO, A&P REPAIR     OB History    Gravida  2   Para  2   Term  2   Preterm      AB      Living  2     SAB      TAB      Ectopic      Multiple      Live Births              Family History  Problem Relation Age of Onset  . Heart  disease Mother   . Hypertension Father   . Hypertension Sister   . Leukemia Sister   . Heart disease Brother   . Multiple sclerosis Brother   . Parkinsonism Brother     Social History   Tobacco Use  . Smoking status: Former Research scientist (life sciences)  . Smokeless tobacco: Never Used  Substance Use Topics  . Alcohol use: No  . Drug use: No    Home Medications Prior to Admission medications   Medication Sig Start Date End Date Taking? Authorizing Provider  acyclovir (ZOVIRAX) 400 MG tablet Take 1 tablet (400 mg total) by mouth daily. 06/29/19  Yes Joseph Pierini, MD  amLODipine (NORVASC) 5 MG tablet Take 5 mg by mouth daily.     Yes [provider]  amoxicillin (AMOXIL) 500 MG capsule Take 500 mg by mouth 4 (four) times daily as needed. Take with dental procedures  Yes [provider]  denosumab (PROLIA) 60 MG/ML SOLN injection Inject 60 mg into the skin every 6 (six) months. Administer in upper arm, thigh, or abdomen   Yes [provider]  hydrochlorothiazide (HYDRODIURIL) 25 MG tablet Take 25 mg by mouth daily. 08/04/13  Yes [provider]  levothyroxine (SYNTHROID, LEVOTHROID) 100 MCG tablet Take 100 mcg by mouth daily before breakfast.    Yes [provider]  lisinopril (ZESTRIL) 10 MG tablet Take 10 mg by mouth daily. 05/08/19  Yes [provider]  Multiple Vitamin (MULTIVITAMIN) capsule Take 1 capsule by mouth daily.     Yes [provider]  simvastatin (ZOCOR) 40 MG tablet Take 40 mg by mouth at bedtime.     Yes [provider]  metroNIDAZOLE (METROGEL) 0.75 % vaginal gel Place 1 Applicatorful vaginally at bedtime. Patient not taking: Reported on 06/29/2019 06/09/18   Fontaine, Belinda Block, MD    Allergies    Patient has no known allergies.  Review of Systems   Review of Systems  Constitutional: Negative for chills and fever.  Respiratory: Negative for cough and shortness of breath.   Cardiovascular: Negative for chest pain  and palpitations.  Gastrointestinal: Negative for abdominal pain and vomiting.  Musculoskeletal: Positive for arthralgias, myalgias and neck pain.  Neurological: Positive for light-headedness and headaches. Negative for syncope and facial asymmetry.  Psychiatric/Behavioral: Negative for agitation and confusion.  All other systems reviewed and are negative.   Physical Exam Updated Vital Signs BP (!) 136/58 (BP Location: Right Arm)   Pulse (!) 57   Temp 98 F (36.7 C) (Oral)   Resp 16   SpO2 96%   Physical Exam Vitals and nursing note reviewed.  Constitutional:      General: She is not in acute distress.    Appearance: She is well-developed.  HENT:     Head: Normocephalic and atraumatic.  Eyes:     Conjunctiva/sclera: Conjunctivae normal.  Neck:     Comments: C spine collar in place No T or L spine midline ttp Cardiovascular:     Rate and Rhythm: Normal rate and regular rhythm.     Pulses: Normal pulses.  Pulmonary:     Effort: Pulmonary effort is normal. No respiratory distress.     Breath sounds: Normal breath sounds.  Skin:    General: Skin is warm and dry.  Neurological:     General: No focal deficit present.     Mental Status: She is alert and oriented to person, place, and time.     Sensory: No sensory deficit.     Motor: No weakness.  Psychiatric:        Mood and Affect: Mood normal.        Behavior: Behavior normal.     ED Results / Procedures / Treatments   Labs (all labs ordered are listed, but only abnormal results are displayed) Labs Reviewed  BASIC METABOLIC PANEL - Abnormal; Notable for the following components:      Result Value   Creatinine, Ser 1.02 (*)    Calcium 10.7 (*)    GFR calc non Af Amer 50 (*)    GFR calc Af Amer 58 (*)    All other components within normal limits  CBC - Abnormal; Notable for the following components:   RBC 3.86 (*)    All other components within normal limits  SARS CORONAVIRUS 2 (TAT 6-24 HRS)     EKG None  Radiology CT Head Wo Contrast  Result Date: 07/20/2019 CLINICAL DATA:  Fall, neck pain EXAM: CT HEAD WITHOUT CONTRAST CT MAXILLOFACIAL WITHOUT CONTRAST CT CERVICAL SPINE WITHOUT CONTRAST TECHNIQUE: Multidetector CT imaging of the head, cervical spine, and maxillofacial structures were performed using the standard protocol without intravenous contrast. Multiplanar CT image reconstructions of the cervical spine and maxillofacial structures were also generated. COMPARISON:  None. FINDINGS: CT HEAD FINDINGS Brain: No evidence of acute infarction, hemorrhage, hydrocephalus, extra-axial collection or mass lesion/mass effect. Periventricular and deep white matter hypodensity. Vascular: No hyperdense vessel or unexpected calcification. CT FACIAL BONES FINDINGS Skull: Normal. Negative for fracture or focal lesion. Facial bones: No displaced fractures or dislocations. Sinuses/Orbits: No acute finding. Other: None. CT CERVICAL SPINE FINDINGS Alignment: Normal. Skull base and vertebrae: There is a mildly displaced posterior arch fracture of C1 (series 3, image 37). The anterior elements of C1 are intact. There is a mildly displaced fracture of the base of the dens extending to involve the bilateral lateral masses and vertebral foramina of C2, (series 3, image 32). No primary bone lesion or focal pathologic process. Soft tissues and spinal canal: No prevertebral fluid or swelling. No visible canal hematoma. Disc levels: Moderate multilevel disc space height loss and osteophytosis. Upper chest: Negative. Other: There is a very large left thyroid goiter, incompletely imaged and extending into the mediastinum, measuring at least 6 cm (series 8, image 20, series 3, image 82) IMPRESSION: 1. No acute intracranial pathology. Small-vessel white matter disease. 2.  No displaced fracture or dislocation of the facial bones. 3. There is a mildly displaced posterior arch fracture of C1 (series 3, image 37). The anterior  elements of C1 are intact. 4. There is a mildly displaced fracture of the base of the dens extending to involve the bilateral lateral masses and vertebral foramina of C2, (series 3, image 32). These results were called by telephone to Dr. Stark Jock at the time of interpretation on 07/20/2019 at 7:40 p.m. Electronically Signed   By: Eddie Candle M.D.   On: 07/20/2019 19:41   CT Cervical Spine Wo Contrast  Result Date: 07/20/2019 CLINICAL DATA:  Fall, neck pain EXAM: CT HEAD WITHOUT CONTRAST CT MAXILLOFACIAL WITHOUT CONTRAST CT CERVICAL SPINE WITHOUT CONTRAST TECHNIQUE: Multidetector CT imaging of the head, cervical spine, and maxillofacial structures were performed using the standard protocol without intravenous contrast. Multiplanar CT image reconstructions of the cervical spine and maxillofacial structures were also generated. COMPARISON:  None. FINDINGS: CT HEAD FINDINGS Brain: No evidence of acute infarction, hemorrhage, hydrocephalus, extra-axial collection or mass lesion/mass effect. Periventricular and deep white matter hypodensity. Vascular: No hyperdense vessel or unexpected calcification. CT FACIAL BONES FINDINGS Skull: Normal. Negative for fracture or focal lesion. Facial bones: No displaced fractures or dislocations. Sinuses/Orbits: No acute finding. Other: None. CT CERVICAL SPINE FINDINGS Alignment: Normal. Skull base and vertebrae: There is a mildly displaced posterior arch fracture of C1 (series 3, image 37). The anterior elements of C1 are intact. There is a mildly displaced fracture of the base of the dens extending to involve the bilateral lateral masses and vertebral foramina of C2, (series 3, image 32). No primary bone lesion or focal pathologic process. Soft tissues and spinal canal: No prevertebral fluid or swelling. No visible canal hematoma. Disc levels: Moderate multilevel disc space height loss and osteophytosis. Upper chest: Negative. Other: There is a very large left thyroid goiter,  incompletely imaged and extending into the mediastinum, measuring at least 6 cm (series 8, image 20, series 3, image 82) IMPRESSION: 1. No acute intracranial  pathology. Small-vessel white matter disease. 2.  No displaced fracture or dislocation of the facial bones. 3. There is a mildly displaced posterior arch fracture of C1 (series 3, image 37). The anterior elements of C1 are intact. 4. There is a mildly displaced fracture of the base of the dens extending to involve the bilateral lateral masses and vertebral foramina of C2, (series 3, image 32). These results were called by telephone to Dr. Stark Jock at the time of interpretation on 07/20/2019 at 7:40 p.m. Electronically Signed   By: Eddie Candle M.D.   On: 07/20/2019 19:41   MR Cervical Spine Wo Contrast  Result Date: 07/20/2019 CLINICAL DATA:  86 year old female status post fall with C1 and C2 fractures on cervical spine CT earlier tonight. EXAM: MRI CERVICAL SPINE WITHOUT CONTRAST TECHNIQUE: Multiplanar, multisequence MR imaging of the cervical spine was performed. No intravenous contrast was administered. COMPARISON:  Cervical spine CT 1912 hours. FINDINGS: Alignment: Stable alignment and straightening of lordosis from the earlier CT. Mild anterolisthesis of C4 on C5 and C7 on T1 appears stable. Vertebrae: Faint marrow edema in the abnormal C1 and C2 vertebrae, including along the vertical fracture line of the posterior C2 body on series 7, image 9. Normal background bone marrow signal. Superimposed degenerative endplate changes in the cervical spine. Visible upper thoracic vertebral bodies appear intact. Cord: No cervical spinal cord signal abnormality despite multilevel degenerative cord mass effect detailed below. No intraspinal hematoma identified. Posterior Fossa, vertebral arteries, paraspinal tissues: Cervicomedullary junction is within normal limits. Partially visible chronic appearing lacunar infarct in the superior pons. Negative visible cerebellum.  Preserved major vascular flow voids in the neck. The right vertebral artery appears mildly dominant. Abnormal posterior interspinous and muscle T2 and STIR hyperintensity at the skull base, C1-C2 and C2-C3 (series 7 image 7). Left greater than right lateral paraspinal muscle signal abnormality at C2-C3 on series 11, image 14. Superimposed abnormal prevertebral fluid or edema, and possible discontinuity of the anterior longitudinal ligament at C2-C3. Incidentally noted large left thyroid goiter on series 11 image 39 with some rightward shift of the trachea and esophagus. Disc levels: Superimposed degenerative cervical spine changes: C2-C3: Mild to moderate facet hypertrophy greater on the left. Mild left C3 foraminal stenosis. C3-C4: Multifactorial mild spinal stenosis and mild spinal cord mass effect with right eccentric circumferential disc osteophyte complex and mild to moderate posterior element hypertrophy. Mild left and moderate to severe right C4 foraminal stenosis. C4-C5: Multifactorial spinal stenosis with mild to moderate spinal cord mass effect related to disc osteophyte complex with bulky posterior component of disc (series 11, image 22), and moderate to severe posterior element hypertrophy. Moderate to severe bilateral C5 foraminal stenosis. C5-C6: Multifactorial spinal stenosis with mild spinal cord mass effect related to left eccentric circumferential disc osteophyte complex and mild to moderate posterior element hypertrophy. Moderate to severe left and mild-to-moderate right C6 foraminal stenosis. C6-C7: Multifactorial spinal stenosis with up to mild spinal cord mass effect related to severe disc space loss with circumferential disc osteophyte complex and mild to moderate posterior element hypertrophy. Moderate to severe left and mild to moderate right C7 foraminal stenosis. C7-T1: Moderate to severe facet hypertrophy but minimal disc bulging. No spinal stenosis. Mild C8 foraminal stenosis. No upper  thoracic spinal stenosis despite disc and posterior element degeneration. IMPRESSION: 1. C1 and C2 fractures as demonstrated by CT with superimposed anterior and posterior ligamentous injury, including suspected ALL injury at C2-C3. 2. No acute traumatic injury to the spinal cord despite widespread  degenerative spinal stenosis with cord mass effect C3-C4 through C6-C7. No definite cord signal abnormality. 3. Left greater than right superior and suboccipital paraspinal muscle injury. 4. Incidental large left thyroid goiter. 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 Electronically Signed   By: Genevie Ann M.D.   On: 07/20/2019 23:27   CT Maxillofacial Wo Contrast  Result Date: 07/20/2019 CLINICAL DATA:  Fall, neck pain EXAM: CT HEAD WITHOUT CONTRAST CT MAXILLOFACIAL WITHOUT CONTRAST CT CERVICAL SPINE WITHOUT CONTRAST TECHNIQUE: Multidetector CT imaging of the head, cervical spine, and maxillofacial structures were performed using the standard protocol without intravenous contrast. Multiplanar CT image reconstructions of the cervical spine and maxillofacial structures were also generated. COMPARISON:  None. FINDINGS: CT HEAD FINDINGS Brain: No evidence of acute infarction, hemorrhage, hydrocephalus, extra-axial collection or mass lesion/mass effect. Periventricular and deep white matter hypodensity. Vascular: No hyperdense vessel or unexpected calcification. CT FACIAL BONES FINDINGS Skull: Normal. Negative for fracture or focal lesion. Facial bones: No displaced fractures or dislocations. Sinuses/Orbits: No acute finding. Other: None. CT CERVICAL SPINE FINDINGS Alignment: Normal. Skull base and vertebrae: There is a mildly displaced posterior arch fracture of C1 (series 3, image 37). The anterior elements of C1 are intact. There is a mildly displaced fracture of the base of the dens extending to involve the bilateral lateral masses and vertebral foramina of C2, (series 3, image 32). No primary bone lesion or  focal pathologic process. Soft tissues and spinal canal: No prevertebral fluid or swelling. No visible canal hematoma. Disc levels: Moderate multilevel disc space height loss and osteophytosis. Upper chest: Negative. Other: There is a very large left thyroid goiter, incompletely imaged and extending into the mediastinum, measuring at least 6 cm (series 8, image 20, series 3, image 82) IMPRESSION: 1. No acute intracranial pathology. Small-vessel white matter disease. 2.  No displaced fracture or dislocation of the facial bones. 3. There is a mildly displaced posterior arch fracture of C1 (series 3, image 37). The anterior elements of C1 are intact. 4. There is a mildly displaced fracture of the base of the dens extending to involve the bilateral lateral masses and vertebral foramina of C2, (series 3, image 32). These results were called by telephone to Dr. Stark Jock at the time of interpretation on 07/20/2019 at 7:40 p.m. Electronically Signed   By: Eddie Candle M.D.   On: 07/20/2019 19:41    Procedures Procedures (including critical care time)  Medications Ordered in ED Medications  acyclovir (ZOVIRAX) tablet 400 mg (400 mg Oral Given 07/21/19 1022)  amLODipine (NORVASC) tablet 5 mg (5 mg Oral Given 07/21/19 0812)  hydrochlorothiazide (HYDRODIURIL) tablet 25 mg (25 mg Oral Given 07/21/19 0812)  lisinopril (ZESTRIL) tablet 10 mg (10 mg Oral Given 07/21/19 0812)  simvastatin (ZOCOR) tablet 40 mg (40 mg Oral Not Given 07/21/19 0503)  levothyroxine (SYNTHROID) tablet 100 mcg (100 mcg Oral Given 07/21/19 0812)  enoxaparin (LOVENOX) injection 40 mg (40 mg Subcutaneous Given 07/21/19 0811)  sodium chloride flush (NS) 0.9 % injection 3 mL (3 mLs Intravenous Not Given 07/21/19 0824)  sodium chloride flush (NS) 0.9 % injection 3 mL (has no administration in time range)  0.9 %  sodium chloride infusion (has no administration in time range)  acetaminophen (TYLENOL) tablet 1,000 mg (1,000 mg Oral Given 07/21/19 1026)   traMADol (ULTRAM) tablet 50 mg (has no administration in time range)  ibuprofen (ADVIL) tablet 600 mg (600 mg Oral Given 07/20/19 2055)  midazolam (VERSED) injection 2 mg (2 mg Intravenous Given 07/20/19  2259)    ED Course  I have reviewed the triage vital signs and the nursing notes.  Pertinent labs & imaging results that were available during my care of the patient were reviewed by me and considered in my medical decision making (see chart for details).  86 yo female presenting to the ED with neck pain and headache after mechanical fall yesterday.  CT imaging on arrival notable for C1 posterior arch fracture, fx at base of the dens (C2).  NO facial bones fx or acute ICH findings.  Patient is neurovascularly intact on my exam.  Complaining of headache.  Given motrin for this.  NSGY consulted as noted below, recommending MRI of the C-spine and maintain aspen collar.    Clinical Course as of Jul 20 1120  Mon Jul 20, 2019  2121 Neurosurgery PA recommending aspen collar, MRI C-spine, medical admission   [MT]  2324 Admitted to hospitalist   [MT]    Clinical Course User Index [MT] Wyvonnia Dusky, MD     Final Clinical Impression(s) / ED Diagnoses Final diagnoses:  Closed displaced fracture of first cervical vertebra, unspecified fracture morphology, initial encounter [facility])  Closed displaced fracture of second cervical vertebra, unspecified fracture morphology, initial encounter [facility])    Rx / DC Orders ED Discharge Orders    None       Wyvonnia Dusky, MD 07/21/19 1123

## 2019-07-20 NOTE — ED Notes (Signed)
Pt at MRI

## 2019-07-20 NOTE — Consult Note (Signed)
Reason for Consult: Neck pain C1-C2 fracture Referring Physician: Emergency department  Kimberly Webb is an 86 y.o. female.  HPI: 86 year old female who fell yesterday start experiencing severe neck pain today while she was brushing her teeth and came to the ER she denies ever experiencing any difficulty with her arms or legs or numbness or tingling.  Work-up has revealed bilateral posterior lamina fractures of C1 as well as a C2 body fracture and fractures of both pars of C2.  Past Medical History:  Diagnosis Date  . Arthritis   . Atrophic vaginitis   . Cystocele   . Fibroid   . Hypercholesteremia   . Hypertension   . Hypothyroidism   . Osteoporosis   . Paratubal cyst   . Rectocele   . STD (sexually transmitted disease)    HSV  . Uterine prolapse     Past Surgical History:  Procedure Laterality Date  . BROKEN ANKLE    . BROKEN FEMUR    . CATARACT EXTRACTION Bilateral   . FOOT SURGERY Right    TOE  . HERNIA REPAIR  2009  . OOPHORECTOMY     BSO WITH VAG HYST IN 2004  . REPLACEMENT TOTAL KNEE  2011   LEFT X 2  . THYROIDECTOMY, PARTIAL    . VAGINAL HYSTERECTOMY  2004   VAG HYST, BSO, A&P REPAIR    Family History  Problem Relation Age of Onset  . Heart disease Mother   . Hypertension Father   . Hypertension Sister   . Leukemia Sister   . Heart disease Brother   . Multiple sclerosis Brother   . Parkinsonism Brother     Social History:  reports that she has quit smoking. She has never used smokeless tobacco. She reports that she does not drink alcohol or use drugs.  Allergies: No Known Allergies  Medications: I have reviewed the patient's current medications.  No results found for this or any previous visit (from the past 48 hour(s)).  CT Head Wo Contrast  Result Date: 07/20/2019 CLINICAL DATA:  Fall, neck pain EXAM: CT HEAD WITHOUT CONTRAST CT MAXILLOFACIAL WITHOUT CONTRAST CT CERVICAL SPINE WITHOUT CONTRAST TECHNIQUE: Multidetector CT imaging of the head,  cervical spine, and maxillofacial structures were performed using the standard protocol without intravenous contrast. Multiplanar CT image reconstructions of the cervical spine and maxillofacial structures were also generated. COMPARISON:  None. FINDINGS: CT HEAD FINDINGS Brain: No evidence of acute infarction, hemorrhage, hydrocephalus, extra-axial collection or mass lesion/mass effect. Periventricular and deep white matter hypodensity. Vascular: No hyperdense vessel or unexpected calcification. CT FACIAL BONES FINDINGS Skull: Normal. Negative for fracture or focal lesion. Facial bones: No displaced fractures or dislocations. Sinuses/Orbits: No acute finding. Other: None. CT CERVICAL SPINE FINDINGS Alignment: Normal. Skull base and vertebrae: There is a mildly displaced posterior arch fracture of C1 (series 3, image 37). The anterior elements of C1 are intact. There is a mildly displaced fracture of the base of the dens extending to involve the bilateral lateral masses and vertebral foramina of C2, (series 3, image 32). No primary bone lesion or focal pathologic process. Soft tissues and spinal canal: No prevertebral fluid or swelling. No visible canal hematoma. Disc levels: Moderate multilevel disc space height loss and osteophytosis. Upper chest: Negative. Other: There is a very large left thyroid goiter, incompletely imaged and extending into the mediastinum, measuring at least 6 cm (series 8, image 20, series 3, image 82) IMPRESSION: 1. No acute intracranial pathology. Small-vessel white matter disease. 2.  No displaced fracture or dislocation of the facial bones. 3. There is a mildly displaced posterior arch fracture of C1 (series 3, image 37). The anterior elements of C1 are intact. 4. There is a mildly displaced fracture of the base of the dens extending to involve the bilateral lateral masses and vertebral foramina of C2, (series 3, image 32). These results were called by telephone to Dr. Stark Jock at the time  of interpretation on 07/20/2019 at 7:40 p.m. Electronically Signed   By: Eddie Candle M.D.   On: 07/20/2019 19:41   CT Cervical Spine Wo Contrast  Result Date: 07/20/2019 CLINICAL DATA:  Fall, neck pain EXAM: CT HEAD WITHOUT CONTRAST CT MAXILLOFACIAL WITHOUT CONTRAST CT CERVICAL SPINE WITHOUT CONTRAST TECHNIQUE: Multidetector CT imaging of the head, cervical spine, and maxillofacial structures were performed using the standard protocol without intravenous contrast. Multiplanar CT image reconstructions of the cervical spine and maxillofacial structures were also generated. COMPARISON:  None. FINDINGS: CT HEAD FINDINGS Brain: No evidence of acute infarction, hemorrhage, hydrocephalus, extra-axial collection or mass lesion/mass effect. Periventricular and deep white matter hypodensity. Vascular: No hyperdense vessel or unexpected calcification. CT FACIAL BONES FINDINGS Skull: Normal. Negative for fracture or focal lesion. Facial bones: No displaced fractures or dislocations. Sinuses/Orbits: No acute finding. Other: None. CT CERVICAL SPINE FINDINGS Alignment: Normal. Skull base and vertebrae: There is a mildly displaced posterior arch fracture of C1 (series 3, image 37). The anterior elements of C1 are intact. There is a mildly displaced fracture of the base of the dens extending to involve the bilateral lateral masses and vertebral foramina of C2, (series 3, image 32). No primary bone lesion or focal pathologic process. Soft tissues and spinal canal: No prevertebral fluid or swelling. No visible canal hematoma. Disc levels: Moderate multilevel disc space height loss and osteophytosis. Upper chest: Negative. Other: There is a very large left thyroid goiter, incompletely imaged and extending into the mediastinum, measuring at least 6 cm (series 8, image 20, series 3, image 82) IMPRESSION: 1. No acute intracranial pathology. Small-vessel white matter disease. 2.  No displaced fracture or dislocation of the facial  bones. 3. There is a mildly displaced posterior arch fracture of C1 (series 3, image 37). The anterior elements of C1 are intact. 4. There is a mildly displaced fracture of the base of the dens extending to involve the bilateral lateral masses and vertebral foramina of C2, (series 3, image 32). These results were called by telephone to Dr. Stark Jock at the time of interpretation on 07/20/2019 at 7:40 p.m. Electronically Signed   By: Eddie Candle M.D.   On: 07/20/2019 19:41   CT Maxillofacial Wo Contrast  Result Date: 07/20/2019 CLINICAL DATA:  Fall, neck pain EXAM: CT HEAD WITHOUT CONTRAST CT MAXILLOFACIAL WITHOUT CONTRAST CT CERVICAL SPINE WITHOUT CONTRAST TECHNIQUE: Multidetector CT imaging of the head, cervical spine, and maxillofacial structures were performed using the standard protocol without intravenous contrast. Multiplanar CT image reconstructions of the cervical spine and maxillofacial structures were also generated. COMPARISON:  None. FINDINGS: CT HEAD FINDINGS Brain: No evidence of acute infarction, hemorrhage, hydrocephalus, extra-axial collection or mass lesion/mass effect. Periventricular and deep white matter hypodensity. Vascular: No hyperdense vessel or unexpected calcification. CT FACIAL BONES FINDINGS Skull: Normal. Negative for fracture or focal lesion. Facial bones: No displaced fractures or dislocations. Sinuses/Orbits: No acute finding. Other: None. CT CERVICAL SPINE FINDINGS Alignment: Normal. Skull base and vertebrae: There is a mildly displaced posterior arch fracture of C1 (series 3, image 37). The anterior elements of  C1 are intact. There is a mildly displaced fracture of the base of the dens extending to involve the bilateral lateral masses and vertebral foramina of C2, (series 3, image 32). No primary bone lesion or focal pathologic process. Soft tissues and spinal canal: No prevertebral fluid or swelling. No visible canal hematoma. Disc levels: Moderate multilevel disc space height  loss and osteophytosis. Upper chest: Negative. Other: There is a very large left thyroid goiter, incompletely imaged and extending into the mediastinum, measuring at least 6 cm (series 8, image 20, series 3, image 82) IMPRESSION: 1. No acute intracranial pathology. Small-vessel white matter disease. 2.  No displaced fracture or dislocation of the facial bones. 3. There is a mildly displaced posterior arch fracture of C1 (series 3, image 37). The anterior elements of C1 are intact. 4. There is a mildly displaced fracture of the base of the dens extending to involve the bilateral lateral masses and vertebral foramina of C2, (series 3, image 32). These results were called by telephone to Dr. Stark Jock at the time of interpretation on 07/20/2019 at 7:40 p.m. Electronically Signed   By: Eddie Candle M.D.   On: 07/20/2019 19:41    Review of Systems  Musculoskeletal: Positive for neck pain.   Blood pressure (!) 78/58, pulse 60, temperature 97.9 F (36.6 C), temperature source Oral, resp. rate 17, SpO2 98 %. Physical Exam  Constitutional: She is oriented to person, place, and time.  Neurological: She is alert and oriented to person, place, and time. She has normal strength. GCS eye subscore is 4. GCS verbal subscore is 5. GCS motor subscore is 6.  Strength 5 out of 5 deltoid, bicep, tricep, wrist flexion, wrist extension, hand intrinsics as well as bilateral lower extremities 5 out of 5 iliopsoas quads hamstrings gastrocs EHL    Assessment/Plan: 86 year old with C1-C2 fractures a C1 fracture should be fine heal in a collar the C2 fracture is little more concerning recommend cervical spine MRI scan without contrast evaluate disc base extent of ligament injury certainly an 86 year old who would like to try to manage this conservatively with a cervical collar she is currently in an [place] collar and I would recommend continuing it.  The main issue upon evaluation of the MRI scan will be if we think she can navigate  this with a collar if she can ambulate and pain well controlled she can potentially go home from the ER versus coming in the hospital and getting physical therapy and making sure that she is able to tolerate and take care of her ADLs while in the cervical collar.  Chrisotpher Rivero P 07/20/2019, 9:42 PM

## 2019-07-21 DIAGNOSIS — S12001A Unspecified nondisplaced fracture of first cervical vertebra, initial encounter for closed fracture: Secondary | ICD-10-CM | POA: Diagnosis not present

## 2019-07-21 DIAGNOSIS — S12100A Unspecified displaced fracture of second cervical vertebra, initial encounter for closed fracture: Secondary | ICD-10-CM | POA: Diagnosis present

## 2019-07-21 DIAGNOSIS — Z96652 Presence of left artificial knee joint: Secondary | ICD-10-CM | POA: Diagnosis present

## 2019-07-21 DIAGNOSIS — Z79899 Other long term (current) drug therapy: Secondary | ICD-10-CM | POA: Diagnosis not present

## 2019-07-21 DIAGNOSIS — Y92009 Unspecified place in unspecified non-institutional (private) residence as the place of occurrence of the external cause: Secondary | ICD-10-CM | POA: Diagnosis not present

## 2019-07-21 DIAGNOSIS — M810 Age-related osteoporosis without current pathological fracture: Secondary | ICD-10-CM | POA: Diagnosis present

## 2019-07-21 DIAGNOSIS — M1990 Unspecified osteoarthritis, unspecified site: Secondary | ICD-10-CM | POA: Diagnosis present

## 2019-07-21 DIAGNOSIS — S12000A Unspecified displaced fracture of first cervical vertebra, initial encounter for closed fracture: Secondary | ICD-10-CM | POA: Diagnosis present

## 2019-07-21 DIAGNOSIS — M542 Cervicalgia: Secondary | ICD-10-CM | POA: Diagnosis not present

## 2019-07-21 DIAGNOSIS — E7800 Pure hypercholesterolemia, unspecified: Secondary | ICD-10-CM | POA: Diagnosis present

## 2019-07-21 DIAGNOSIS — Z79890 Hormone replacement therapy (postmenopausal): Secondary | ICD-10-CM | POA: Diagnosis not present

## 2019-07-21 DIAGNOSIS — Z87891 Personal history of nicotine dependence: Secondary | ICD-10-CM | POA: Diagnosis not present

## 2019-07-21 DIAGNOSIS — S129XXA Fracture of neck, unspecified, initial encounter: Secondary | ICD-10-CM | POA: Diagnosis present

## 2019-07-21 DIAGNOSIS — Z20822 Contact with and (suspected) exposure to covid-19: Secondary | ICD-10-CM | POA: Diagnosis present

## 2019-07-21 DIAGNOSIS — E039 Hypothyroidism, unspecified: Secondary | ICD-10-CM | POA: Diagnosis present

## 2019-07-21 DIAGNOSIS — Z90710 Acquired absence of both cervix and uterus: Secondary | ICD-10-CM | POA: Diagnosis not present

## 2019-07-21 DIAGNOSIS — I10 Essential (primary) hypertension: Secondary | ICD-10-CM | POA: Diagnosis present

## 2019-07-21 DIAGNOSIS — W010XXA Fall on same level from slipping, tripping and stumbling without subsequent striking against object, initial encounter: Secondary | ICD-10-CM | POA: Diagnosis present

## 2019-07-21 LAB — CBC
HCT: 38 % (ref 36.0–46.0)
Hemoglobin: 12.6 g/dL (ref 12.0–15.0)
MCH: 32.6 pg (ref 26.0–34.0)
MCHC: 33.2 g/dL (ref 30.0–36.0)
MCV: 98.4 fL (ref 80.0–100.0)
Platelets: 228 10*3/uL (ref 150–400)
RBC: 3.86 MIL/uL — ABNORMAL LOW (ref 3.87–5.11)
RDW: 13 % (ref 11.5–15.5)
WBC: 9.3 10*3/uL (ref 4.0–10.5)
nRBC: 0 % (ref 0.0–0.2)

## 2019-07-21 LAB — BASIC METABOLIC PANEL
Anion gap: 10 (ref 5–15)
BUN: 20 mg/dL (ref 8–23)
CO2: 23 mmol/L (ref 22–32)
Calcium: 10.7 mg/dL — ABNORMAL HIGH (ref 8.9–10.3)
Chloride: 108 mmol/L (ref 98–111)
Creatinine, Ser: 1.02 mg/dL — ABNORMAL HIGH (ref 0.44–1.00)
GFR calc Af Amer: 58 mL/min — ABNORMAL LOW (ref >60)
GFR calc non Af Amer: 50 mL/min — ABNORMAL LOW (ref >60)
Glucose, Bld: 99 mg/dL (ref 70–99)
Potassium: 3.8 mmol/L (ref 3.5–5.1)
Sodium: 141 mmol/L (ref 135–145)

## 2019-07-21 LAB — SARS CORONAVIRUS 2 (TAT 6-24 HRS): SARS Coronavirus 2: NEGATIVE

## 2019-07-21 MED ORDER — ENOXAPARIN SODIUM 40 MG/0.4ML ~~LOC~~ SOLN
40.0000 mg | Freq: Every day | SUBCUTANEOUS | Status: DC
  Administered 2019-07-21 – 2019-07-22 (×2): 40 mg via SUBCUTANEOUS
  Filled 2019-07-21 (×2): qty 0.4

## 2019-07-21 MED ORDER — SIMVASTATIN 20 MG PO TABS
40.0000 mg | ORAL_TABLET | Freq: Every day | ORAL | Status: DC
  Administered 2019-07-21: 22:00:00 40 mg via ORAL
  Filled 2019-07-21: qty 2

## 2019-07-21 MED ORDER — ACYCLOVIR 400 MG PO TABS
400.0000 mg | ORAL_TABLET | Freq: Every day | ORAL | Status: DC
  Administered 2019-07-21 – 2019-07-22 (×2): 400 mg via ORAL
  Filled 2019-07-21 (×2): qty 1

## 2019-07-21 MED ORDER — ACETAMINOPHEN 500 MG PO TABS
1000.0000 mg | ORAL_TABLET | Freq: Three times a day (TID) | ORAL | Status: DC
  Administered 2019-07-21 – 2019-07-22 (×5): 1000 mg via ORAL
  Filled 2019-07-21 (×5): qty 2

## 2019-07-21 MED ORDER — AMLODIPINE BESYLATE 5 MG PO TABS
5.0000 mg | ORAL_TABLET | Freq: Every day | ORAL | Status: DC
  Administered 2019-07-21 – 2019-07-22 (×2): 5 mg via ORAL
  Filled 2019-07-21 (×2): qty 1

## 2019-07-21 MED ORDER — ACETAMINOPHEN 650 MG RE SUPP: 650.0000 mg | Freq: Four times a day (QID) | RECTAL | Status: DC | PRN

## 2019-07-21 MED ORDER — SODIUM CHLORIDE 0.9 % IV SOLN: 250.0000 mL | INTRAVENOUS | Status: DC | PRN

## 2019-07-21 MED ORDER — ACETAMINOPHEN 325 MG PO TABS: 650.0000 mg | ORAL_TABLET | Freq: Four times a day (QID) | ORAL | Status: DC | PRN

## 2019-07-21 MED ORDER — HYDROCHLOROTHIAZIDE 25 MG PO TABS
25.0000 mg | ORAL_TABLET | Freq: Every day | ORAL | Status: DC
  Administered 2019-07-21 – 2019-07-22 (×2): 25 mg via ORAL
  Filled 2019-07-21 (×2): qty 1

## 2019-07-21 MED ORDER — TRAMADOL HCL 50 MG PO TABS
50.0000 mg | ORAL_TABLET | Freq: Four times a day (QID) | ORAL | Status: DC | PRN
  Administered 2019-07-21 – 2019-07-22 (×3): 50 mg via ORAL
  Filled 2019-07-21 (×3): qty 1

## 2019-07-21 MED ORDER — KETOROLAC TROMETHAMINE 15 MG/ML IJ SOLN
15.0000 mg | Freq: Four times a day (QID) | INTRAMUSCULAR | Status: DC | PRN
  Administered 2019-07-21: 05:00:00 15 mg via INTRAVENOUS
  Filled 2019-07-21: qty 1

## 2019-07-21 MED ORDER — LEVOTHYROXINE SODIUM 100 MCG PO TABS
100.0000 ug | ORAL_TABLET | Freq: Every day | ORAL | Status: DC
  Administered 2019-07-21 – 2019-07-22 (×2): 100 ug via ORAL
  Filled 2019-07-21 (×2): qty 1

## 2019-07-21 MED ORDER — LISINOPRIL 10 MG PO TABS
10.0000 mg | ORAL_TABLET | Freq: Every day | ORAL | Status: DC
  Administered 2019-07-21 – 2019-07-22 (×2): 10 mg via ORAL
  Filled 2019-07-21 (×2): qty 1

## 2019-07-21 MED ORDER — SODIUM CHLORIDE 0.9% FLUSH
3.0000 mL | Freq: Two times a day (BID) | INTRAVENOUS | Status: DC
  Administered 2019-07-21 – 2019-07-22 (×2): 3 mL via INTRAVENOUS

## 2019-07-21 MED ORDER — SODIUM CHLORIDE 0.9% FLUSH: 3.0000 mL | INTRAVENOUS | Status: DC | PRN

## 2019-07-21 NOTE — Progress Notes (Signed)
Patient placed in observation after midnight but care began in the ER prior to midnight.  Please see H&P by Dr. Linda Hedges.  Patient currently working with physical therapy await final recommendations.  For pain control will schedule Tylenol and have as needed pain medications.  Plan for discharge once pain is controlled as well as PT/OT evaluation complete. Neurosurgical consultation appreciated: No plans for surgery, continue Asp[place] collar Eulogio Bear DO

## 2019-07-21 NOTE — Progress Notes (Signed)
Subjective: Patient reports Patient feeling better this morning she does set the collar helps her neck pain.  She continues to deny any numbness tingling pain in her upper lower extremities.  Objective: Vital signs in last 24 hours: Temp:  [97.3 F (36.3 C)-98 F (36.7 C)] 98 F (36.7 C) (02/16 0439) Pulse Rate:  [56-68] 57 (02/16 0439) Resp:  [9-18] 16 (02/16 0439) BP: (78-163)/(58-73) 136/58 (02/16 0439) SpO2:  [96 %-100 %] 96 % (02/16 0439)  Intake/Output from previous day: No intake/output data recorded. Intake/Output this shift: No intake/output data recorded.  Awake alert neurologically nonfocal strength intact readjust her collar instructor however want her to wear it.  Lab Results: Recent Labs    07/21/19 0311  WBC 9.3  HGB 12.6  HCT 38.0  PLT 228   BMET Recent Labs    07/21/19 0311  NA 141  K 3.8  CL 108  CO2 23  GLUCOSE 99  BUN 20  CREATININE 1.02*  CALCIUM 10.7*    Studies/Results: CT Head Wo Contrast  Result Date: 07/20/2019 CLINICAL DATA:  Fall, neck pain EXAM: CT HEAD WITHOUT CONTRAST CT MAXILLOFACIAL WITHOUT CONTRAST CT CERVICAL SPINE WITHOUT CONTRAST TECHNIQUE: Multidetector CT imaging of the head, cervical spine, and maxillofacial structures were performed using the standard protocol without intravenous contrast. Multiplanar CT image reconstructions of the cervical spine and maxillofacial structures were also generated. COMPARISON:  None. FINDINGS: CT HEAD FINDINGS Brain: No evidence of acute infarction, hemorrhage, hydrocephalus, extra-axial collection or mass lesion/mass effect. Periventricular and deep white matter hypodensity. Vascular: No hyperdense vessel or unexpected calcification. CT FACIAL BONES FINDINGS Skull: Normal. Negative for fracture or focal lesion. Facial bones: No displaced fractures or dislocations. Sinuses/Orbits: No acute finding. Other: None. CT CERVICAL SPINE FINDINGS Alignment: Normal. Skull base and vertebrae: There is a  mildly displaced posterior arch fracture of C1 (series 3, image 37). The anterior elements of C1 are intact. There is a mildly displaced fracture of the base of the dens extending to involve the bilateral lateral masses and vertebral foramina of C2, (series 3, image 32). No primary bone lesion or focal pathologic process. Soft tissues and spinal canal: No prevertebral fluid or swelling. No visible canal hematoma. Disc levels: Moderate multilevel disc space height loss and osteophytosis. Upper chest: Negative. Other: There is a very large left thyroid goiter, incompletely imaged and extending into the mediastinum, measuring at least 6 cm (series 8, image 20, series 3, image 82) IMPRESSION: 1. No acute intracranial pathology. Small-vessel white matter disease. 2.  No displaced fracture or dislocation of the facial bones. 3. There is a mildly displaced posterior arch fracture of C1 (series 3, image 37). The anterior elements of C1 are intact. 4. There is a mildly displaced fracture of the base of the dens extending to involve the bilateral lateral masses and vertebral foramina of C2, (series 3, image 32). These results were called by telephone to Dr. Stark Jock at the time of interpretation on 07/20/2019 at 7:40 p.m. Electronically Signed   By: Eddie Candle M.D.   On: 07/20/2019 19:41   CT Cervical Spine Wo Contrast  Result Date: 07/20/2019 CLINICAL DATA:  Fall, neck pain EXAM: CT HEAD WITHOUT CONTRAST CT MAXILLOFACIAL WITHOUT CONTRAST CT CERVICAL SPINE WITHOUT CONTRAST TECHNIQUE: Multidetector CT imaging of the head, cervical spine, and maxillofacial structures were performed using the standard protocol without intravenous contrast. Multiplanar CT image reconstructions of the cervical spine and maxillofacial structures were also generated. COMPARISON:  None. FINDINGS: CT HEAD FINDINGS Brain: No  evidence of acute infarction, hemorrhage, hydrocephalus, extra-axial collection or mass lesion/mass effect. Periventricular and  deep white matter hypodensity. Vascular: No hyperdense vessel or unexpected calcification. CT FACIAL BONES FINDINGS Skull: Normal. Negative for fracture or focal lesion. Facial bones: No displaced fractures or dislocations. Sinuses/Orbits: No acute finding. Other: None. CT CERVICAL SPINE FINDINGS Alignment: Normal. Skull base and vertebrae: There is a mildly displaced posterior arch fracture of C1 (series 3, image 37). The anterior elements of C1 are intact. There is a mildly displaced fracture of the base of the dens extending to involve the bilateral lateral masses and vertebral foramina of C2, (series 3, image 32). No primary bone lesion or focal pathologic process. Soft tissues and spinal canal: No prevertebral fluid or swelling. No visible canal hematoma. Disc levels: Moderate multilevel disc space height loss and osteophytosis. Upper chest: Negative. Other: There is a very large left thyroid goiter, incompletely imaged and extending into the mediastinum, measuring at least 6 cm (series 8, image 20, series 3, image 82) IMPRESSION: 1. No acute intracranial pathology. Small-vessel white matter disease. 2.  No displaced fracture or dislocation of the facial bones. 3. There is a mildly displaced posterior arch fracture of C1 (series 3, image 37). The anterior elements of C1 are intact. 4. There is a mildly displaced fracture of the base of the dens extending to involve the bilateral lateral masses and vertebral foramina of C2, (series 3, image 32). These results were called by telephone to Dr. Stark Jock at the time of interpretation on 07/20/2019 at 7:40 p.m. Electronically Signed   By: Eddie Candle M.D.   On: 07/20/2019 19:41   MR Cervical Spine Wo Contrast  Result Date: 07/20/2019 CLINICAL DATA:  86 year old female status post fall with C1 and C2 fractures on cervical spine CT earlier tonight. EXAM: MRI CERVICAL SPINE WITHOUT CONTRAST TECHNIQUE: Multiplanar, multisequence MR imaging of the cervical spine was  performed. No intravenous contrast was administered. COMPARISON:  Cervical spine CT 1912 hours. FINDINGS: Alignment: Stable alignment and straightening of lordosis from the earlier CT. Mild anterolisthesis of C4 on C5 and C7 on T1 appears stable. Vertebrae: Faint marrow edema in the abnormal C1 and C2 vertebrae, including along the vertical fracture line of the posterior C2 body on series 7, image 9. Normal background bone marrow signal. Superimposed degenerative endplate changes in the cervical spine. Visible upper thoracic vertebral bodies appear intact. Cord: No cervical spinal cord signal abnormality despite multilevel degenerative cord mass effect detailed below. No intraspinal hematoma identified. Posterior Fossa, vertebral arteries, paraspinal tissues: Cervicomedullary junction is within normal limits. Partially visible chronic appearing lacunar infarct in the superior pons. Negative visible cerebellum. Preserved major vascular flow voids in the neck. The right vertebral artery appears mildly dominant. Abnormal posterior interspinous and muscle T2 and STIR hyperintensity at the skull base, C1-C2 and C2-C3 (series 7 image 7). Left greater than right lateral paraspinal muscle signal abnormality at C2-C3 on series 11, image 14. Superimposed abnormal prevertebral fluid or edema, and possible discontinuity of the anterior longitudinal ligament at C2-C3. Incidentally noted large left thyroid goiter on series 11 image 39 with some rightward shift of the trachea and esophagus. Disc levels: Superimposed degenerative cervical spine changes: C2-C3: Mild to moderate facet hypertrophy greater on the left. Mild left C3 foraminal stenosis. C3-C4: Multifactorial mild spinal stenosis and mild spinal cord mass effect with right eccentric circumferential disc osteophyte complex and mild to moderate posterior element hypertrophy. Mild left and moderate to severe right C4 foraminal stenosis. C4-C5: Multifactorial  spinal stenosis  with mild to moderate spinal cord mass effect related to disc osteophyte complex with bulky posterior component of disc (series 11, image 22), and moderate to severe posterior element hypertrophy. Moderate to severe bilateral C5 foraminal stenosis. C5-C6: Multifactorial spinal stenosis with mild spinal cord mass effect related to left eccentric circumferential disc osteophyte complex and mild to moderate posterior element hypertrophy. Moderate to severe left and mild-to-moderate right C6 foraminal stenosis. C6-C7: Multifactorial spinal stenosis with up to mild spinal cord mass effect related to severe disc space loss with circumferential disc osteophyte complex and mild to moderate posterior element hypertrophy. Moderate to severe left and mild to moderate right C7 foraminal stenosis. C7-T1: Moderate to severe facet hypertrophy but minimal disc bulging. No spinal stenosis. Mild C8 foraminal stenosis. No upper thoracic spinal stenosis despite disc and posterior element degeneration. IMPRESSION: 1. C1 and C2 fractures as demonstrated by CT with superimposed anterior and posterior ligamentous injury, including suspected ALL injury at C2-C3. 2. No acute traumatic injury to the spinal cord despite widespread degenerative spinal stenosis with cord mass effect C3-C4 through C6-C7. No definite cord signal abnormality. 3. Left greater than right superior and suboccipital paraspinal muscle injury. 4. Incidental large left thyroid goiter. 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 Electronically Signed   By: Genevie Ann M.D.   On: 07/20/2019 23:27   CT Maxillofacial Wo Contrast  Result Date: 07/20/2019 CLINICAL DATA:  Fall, neck pain EXAM: CT HEAD WITHOUT CONTRAST CT MAXILLOFACIAL WITHOUT CONTRAST CT CERVICAL SPINE WITHOUT CONTRAST TECHNIQUE: Multidetector CT imaging of the head, cervical spine, and maxillofacial structures were performed using the standard protocol without intravenous contrast. Multiplanar CT image  reconstructions of the cervical spine and maxillofacial structures were also generated. COMPARISON:  None. FINDINGS: CT HEAD FINDINGS Brain: No evidence of acute infarction, hemorrhage, hydrocephalus, extra-axial collection or mass lesion/mass effect. Periventricular and deep white matter hypodensity. Vascular: No hyperdense vessel or unexpected calcification. CT FACIAL BONES FINDINGS Skull: Normal. Negative for fracture or focal lesion. Facial bones: No displaced fractures or dislocations. Sinuses/Orbits: No acute finding. Other: None. CT CERVICAL SPINE FINDINGS Alignment: Normal. Skull base and vertebrae: There is a mildly displaced posterior arch fracture of C1 (series 3, image 37). The anterior elements of C1 are intact. There is a mildly displaced fracture of the base of the dens extending to involve the bilateral lateral masses and vertebral foramina of C2, (series 3, image 32). No primary bone lesion or focal pathologic process. Soft tissues and spinal canal: No prevertebral fluid or swelling. No visible canal hematoma. Disc levels: Moderate multilevel disc space height loss and osteophytosis. Upper chest: Negative. Other: There is a very large left thyroid goiter, incompletely imaged and extending into the mediastinum, measuring at least 6 cm (series 8, image 20, series 3, image 82) IMPRESSION: 1. No acute intracranial pathology. Small-vessel white matter disease. 2.  No displaced fracture or dislocation of the facial bones. 3. There is a mildly displaced posterior arch fracture of C1 (series 3, image 37). The anterior elements of C1 are intact. 4. There is a mildly displaced fracture of the base of the dens extending to involve the bilateral lateral masses and vertebral foramina of C2, (series 3, image 32). These results were called by telephone to Dr. Stark Jock at the time of interpretation on 07/20/2019 at 7:40 p.m. Electronically Signed   By: Eddie Candle M.D.   On: 07/20/2019 19:41     Assessment/Plan: 86 year old female with C1-C2 fracture MRI scan shows good alignment  does raise the hint of in a LL injury although not definitive there is certainly some posterior ligament injury I do think there is a chance is good healing a collar and she would certainly prefer nonsurgical management I instructed her the point importance of wearing her collar 100% of the time.  We can certainly get her a [place] collar upon discharge but today of like to use physical therapy to mobilize make sure the patient can take care of herself prior to discharge the patient should be discharged reschedule follow-up in a week for x-rays.  LOS: 0 days     Kwesi Sangha P 07/21/2019, 9:07 AM

## 2019-07-21 NOTE — TOC Initial Note (Signed)
Transition of Care [facility]) - Initial/Assessment Note    Patient Details  Name: Kimberly Webb MRN: [id number] Date of Birth: Nov 26, 1932  Transition of Care [facility]) CM/SW Contact:    Alexander Mt, LCSW Phone Number: 07/21/2019, 3:19 PM  Clinical Narrative:                 CSW spoke with pt at bedside, introduced self, role, reason for visit. Pt from home with spouse, she usually ambulates as needed with a walker or a cane. Pt confirmed home address and pcp. She states she feels better with her collar but is nervous about doing everything at home. Pt does confirm that she would like to return home and we discussed home health. Pt has had home health in the past but cannot remember who she had previously. CSW provided CMS ratings list, she will review for preference. CSW also ordered a "[place] collar for shower" from ortho tech and it will be delivered to pt room prior to dc.   TOC team to meet with pt again to discuss preferred [facility] provider.   Expected Discharge Plan: [place] Barriers to Discharge: Continued Medical Work up   Patient Goals and CMS Choice Patient states their goals for this hospitalization and ongoing recovery are:: to get home when I can CMS Medicare.gov Compare Post Acute Care list provided to:: Patient Choice offered to / list presented to : Patient  Expected Discharge Plan and Services Expected Discharge Plan: [place] In-house Referral: Clinical Social Work Discharge Planning Services: CM Consult Post Acute Care Choice: Home Health, Durable Medical Equipment Living arrangements for the past 2 months: Single Family Home                 DME Arranged: Other see comment([place] collar for showering) DME Agency: Other - Comment(ortho tech at hospital) Date DME Agency Contacted: 07/21/19 Time DME Agency Contacted: [place]: PT, OT  Prior Living Arrangements/Services Living arrangements for the past 2 months:  Single Family Home Lives with:: Spouse Patient language and need for interpreter reviewed:: Yes(no needs) Do you feel safe going back to the place where you live?: Yes      Need for Family Participation in Patient Care: Yes (Comment)(assistance with daily cares) Care giver support system in place?: Yes (comment)(spouse) Current home services: DME Criminal Activity/Legal Involvement Pertinent to Current Situation/Hospitalization: No - Comment as needed  Activities of Daily Living Home Assistive Devices/Equipment: None ADL Screening (condition at time of admission) Patient's cognitive ability adequate to safely complete daily activities?: Yes Is the patient deaf or have difficulty hearing?: No Does the patient have difficulty seeing, even when wearing glasses/contacts?: No Does the patient have difficulty concentrating, remembering, or making decisions?: No Patient able to express need for assistance with ADLs?: Yes Does the patient have difficulty dressing or bathing?: No Independently performs ADLs?: Yes (appropriate for developmental age) Does the patient have difficulty walking or climbing stairs?: Yes Weakness of Legs: Both Weakness of Arms/Hands: Both  Permission Sought/Granted Permission sought to share information with : Family Supports Permission granted to share information with : Yes, Verbal Permission Granted  Share Information with NAME: Donna Leatherwood  Permission granted to share info w AGENCY: [place] granted to share info w Relationship: spouse  Permission granted to share info w Contact Information: [phone]  Emotional Assessment Appearance:: Appears stated age Attitude/Demeanor/Rapport: Engaged Affect (typically observed): Accepting, Flat Orientation: : Oriented to Self, Oriented to Place, Oriented  to Situation, Oriented to  Time Alcohol / Substance Use: Not Applicable Psych Involvement: No (comment)  Admission diagnosis:  C1 cervical fracture ([place])  [S12.000A] Neck fracture ([facility]) [S12.9XXA] Patient Active Problem List   Diagnosis Date Noted  . C1 cervical fracture ([place]) 07/21/2019  . Neck fracture ([place]) 07/21/2019  . Vaginal discharge 09/07/2016  . Menopause 09/06/2016  . History of herpes genitalis 04/04/2016  . Skin mole 04/01/2013  . STD (sexually transmitted disease)   . Rectocele   . Atrophic vaginitis   . Osteoporosis   . Hypercholesteremia   . Arthritis   . Hypothyroidism    PCP:  Alroy Dust, L.Marlou Sa, MD Pharmacy:   [id number] - [place], [place] [address] [address] Phone: [phone] Fax: [phone]   Readmission Risk Interventions No flowsheet data found.

## 2019-07-21 NOTE — H&P (Signed)
History and Physical    Kimberly Webb [id number] DOB: Aug 05, 1932 DOA: 07/20/2019  PCP: Alroy Dust, L.Marlou Sa, MD (Confirm with patient/family/NH records and if not entered, this has to be entered at [facility] point of entry) Patient coming from: home  I have personally briefly reviewed patient's old medical records in [place]  Chief Complaint: neck pain  HPI: Kimberly Webb is a 86 y.o. female with medical history significant of osteoporosis who fell yesterday and started experiencing severe neck pain today while she was brushing her teeth and came to the ER.She denies ever experiencing any difficulty with her arms or legs or numbness or tingling.  (For level 3, the HPI must include 4+ descriptors: Location, Quality, Severity, Duration, Timing, Context, modifying factors, associated signs/symptoms and/or status of 3+ chronic problems.)  (Please avoid self-populating past medical history here) (The initial 2-3 lines should be focused and good to copy and paste in the HPI section of the daily progress note).  ED Course: Patient hemodynamically stable in the ED. CT neck revealed   bilateral posterior lamina fractures of C1 as well as a C2 body fracture and fractures of both pars of C2. MRI reveals ligamentous injury including the C2C3 level.; right superior and suboccipital paraspinal muscle injury. NS consult done with recommendation for continuing Aspen cervical collar and admit for PT/OT eval. No surgical intervention contemplated. TRH called to admit patient.  Review of Systems: As per HPI otherwise 10 point review of systems negative.  Unacceptable ROS statements: "10 systems reviewed," "Extensive" (without elaboration).  Acceptable ROS statements: "All others negative," "All others reviewed and are negative," and "All others unremarkable," with at [place] documented Can't double dip - if using for HPI can't use for ROS  Past Medical History:  Diagnosis Date  . Arthritis   .  Atrophic vaginitis   . Cystocele   . Fibroid   . Hypercholesteremia   . Hypertension   . Hypothyroidism   . Osteoporosis   . Paratubal cyst   . Rectocele   . STD (sexually transmitted disease)    HSV  . Uterine prolapse     Past Surgical History:  Procedure Laterality Date  . BROKEN ANKLE    . BROKEN FEMUR    . CATARACT EXTRACTION Bilateral   . FOOT SURGERY Right    TOE  . HERNIA REPAIR  2009  . OOPHORECTOMY     BSO WITH VAG HYST IN 2004  . REPLACEMENT TOTAL KNEE  2011   LEFT X 2  . THYROIDECTOMY, PARTIAL    . VAGINAL HYSTERECTOMY  2004   VAG HYST, BSO, A&P REPAIR   Soc Hx  Married x 3: 18 yrs, 22 yrs, currently 21 yrs. She has several children. She worked as an Barista. She lives with her husband and grandson.   reports that she has quit smoking. She has never used smokeless tobacco. She reports that she does not drink alcohol or use drugs.  No Known Allergies  Family History  Problem Relation Age of Onset  . Heart disease Mother   . Hypertension Father   . Hypertension Sister   . Leukemia Sister   . Heart disease Brother   . Multiple sclerosis Brother   . Parkinsonism Brother    Unacceptable: Noncontributory, unremarkable, or negative. Acceptable: Family history reviewed and not pertinent (If you reviewed it)  Prior to Admission medications   Medication Sig Start Date End Date Taking? Authorizing Provider  acyclovir (ZOVIRAX) 400 MG tablet Take  1 tablet (400 mg total) by mouth daily. 06/29/19  Yes Joseph Pierini, MD  amLODipine (NORVASC) 5 MG tablet Take 5 mg by mouth daily.     Yes [provider]  amoxicillin (AMOXIL) 500 MG capsule Take 500 mg by mouth 4 (four) times daily as needed. Take with dental procedures   Yes [provider]  denosumab (PROLIA) 60 MG/ML SOLN injection Inject 60 mg into the skin every 6 (six) months. Administer in upper arm, thigh, or abdomen   Yes [provider]  hydrochlorothiazide (HYDRODIURIL) 25  MG tablet Take 25 mg by mouth daily. 08/04/13  Yes [provider]  levothyroxine (SYNTHROID, LEVOTHROID) 100 MCG tablet Take 100 mcg by mouth daily before breakfast.    Yes [provider]  lisinopril (ZESTRIL) 10 MG tablet Take 10 mg by mouth daily. 05/08/19  Yes [provider]  Multiple Vitamin (MULTIVITAMIN) capsule Take 1 capsule by mouth daily.     Yes [provider]  simvastatin (ZOCOR) 40 MG tablet Take 40 mg by mouth at bedtime.     Yes [provider]  metroNIDAZOLE (METROGEL) 0.75 % vaginal gel Place 1 Applicatorful vaginally at bedtime. Patient not taking: Reported on 06/29/2019 06/09/18   Anastasio Auerbach, MD    Physical Exam: Vitals:   07/20/19 2335 07/21/19 0000 07/21/19 0030 07/21/19 0100  BP: (!) 163/60 (!) 158/58 (!) 152/64 (!) 149/65  Pulse:  (!) 56 (!) 58 60  Resp: 16 (!) 9 14 15   Temp:      TempSrc:      SpO2:  97% 99% 97%    Constitutional: NAD, calm, comfortable Vitals:   07/20/19 2335 07/21/19 0000 07/21/19 0030 07/21/19 0100  BP: (!) 163/60 (!) 158/58 (!) 152/64 (!) 149/65  Pulse:  (!) 56 (!) 58 60  Resp: 16 (!) 9 14 15   Temp:      TempSrc:      SpO2:  97% 99% 97%   General:  Obese woman in no distress in an Design[employer], multimedia. Eyes: PERRL, lids and conjunctivae normal ENMT: Mucous membranes are moist. Posterior pharynx clear of any exudate or lesions.Normal dentition.  Neck: in an Aspen collar - not removed for exam Respiratory: clear to auscultation bilaterally, no wheezing, no crackles. Normal respiratory effort. No accessory muscle use.  Cardiovascular: Regular rate and rhythm, no murmurs / rubs / gallops. No extremity edema. 2+ pedal pulses. No carotid bruits.  Abdomen: obese, no tenderness, no masses palpated. No hepatosplenomegaly. Bowel sounds positive.  Musculoskeletal: no clubbing / cyanosis. No joint deformity upper and lower extremities. Good ROM, no contractures. Normal muscle tone.  Skin: no rashes,  lesions, ulcers. No induration Neurologic: CN 2-12 grossly intact. Sensation intact, DTR normal. Strength 5/5 in all 4.  Psychiatric: Normal judgment and insight. Alert and oriented x 3. Normal mood.   (Anything < 9 systems with 2 bullets each down codes to level 1) (If patient refuses exam can't bill higher level) (Make sure to document decubitus ulcers present on admission -- if possible -- and whether patient has chronic indwelling catheter at time of admission)  Labs on Admission: I have personally reviewed following labs and imaging studies  CBC: No results for input(s): WBC, NEUTROABS, HGB, HCT, MCV, PLT in the last 168 hours. Basic Metabolic Panel: No results for input(s): NA, K, CL, CO2, GLUCOSE, BUN, CREATININE, CALCIUM, MG, PHOS in the last 168 hours. GFR: CrCl cannot be calculated (Patient's most recent lab result is older than the maximum  21 days allowed.). Liver Function Tests: No results for input(s): AST, ALT, ALKPHOS, BILITOT, PROT, ALBUMIN in the last 168 hours. No results for input(s): LIPASE, AMYLASE in the last 168 hours. No results for input(s): AMMONIA in the last 168 hours. Coagulation Profile: No results for input(s): INR, PROTIME in the last 168 hours. Cardiac Enzymes: No results for input(s): CKTOTAL, CKMB, CKMBINDEX, TROPONINI in the last 168 hours. BNP (last 3 results) No results for input(s): PROBNP in the last 8760 hours. HbA1C: No results for input(s): HGBA1C in the last 72 hours. CBG: No results for input(s): GLUCAP in the last 168 hours. Lipid Profile: No results for input(s): CHOL, HDL, LDLCALC, TRIG, CHOLHDL, LDLDIRECT in the last 72 hours. Thyroid Function Tests: No results for input(s): TSH, T4TOTAL, FREET4, T3FREE, THYROIDAB in the last 72 hours. Anemia Panel: No results for input(s): VITAMINB12, FOLATE, FERRITIN, TIBC, IRON, RETICCTPCT in the last 72 hours. Urine analysis:    Component Value Date/Time   COLORURINE YELLOW 09/06/2016 1555    APPEARANCEUR CLEAR 09/06/2016 1555   LABSPEC 1.020 09/06/2016 1555   PHURINE 5.0 09/06/2016 1555   GLUCOSEU NEGATIVE 09/06/2016 1555   HGBUR NEGATIVE 09/06/2016 [place] 09/06/2016 [place] 09/06/2016 1555   PROTEINUR NEGATIVE 09/06/2016 1555   UROBILINOGEN 0.2 10/31/2012 1238   NITRITE NEGATIVE 09/06/2016 [place] 09/06/2016 1555    Radiological Exams on Admission: CT Head Wo Contrast  Result Date: 07/20/2019 CLINICAL DATA:  Fall, neck pain EXAM: CT HEAD WITHOUT CONTRAST CT MAXILLOFACIAL WITHOUT CONTRAST CT CERVICAL SPINE WITHOUT CONTRAST TECHNIQUE: Multidetector CT imaging of the head, cervical spine, and maxillofacial structures were performed using the standard protocol without intravenous contrast. Multiplanar CT image reconstructions of the cervical spine and maxillofacial structures were also generated. COMPARISON:  None. FINDINGS: CT HEAD FINDINGS Brain: No evidence of acute infarction, hemorrhage, hydrocephalus, extra-axial collection or mass lesion/mass effect. Periventricular and deep white matter hypodensity. Vascular: No hyperdense vessel or unexpected calcification. CT FACIAL BONES FINDINGS Skull: Normal. Negative for fracture or focal lesion. Facial bones: No displaced fractures or dislocations. Sinuses/Orbits: No acute finding. Other: None. CT CERVICAL SPINE FINDINGS Alignment: Normal. Skull base and vertebrae: There is a mildly displaced posterior arch fracture of C1 (series 3, image 37). The anterior elements of C1 are intact. There is a mildly displaced fracture of the base of the dens extending to involve the bilateral lateral masses and vertebral foramina of C2, (series 3, image 32). No primary bone lesion or focal pathologic process. Soft tissues and spinal canal: No prevertebral fluid or swelling. No visible canal hematoma. Disc levels: Moderate multilevel disc space height loss and osteophytosis. Upper chest: Negative.  Other: There is a very large left thyroid goiter, incompletely imaged and extending into the mediastinum, measuring at least 6 cm (series 8, image 20, series 3, image 82) IMPRESSION: 1. No acute intracranial pathology. Small-vessel white matter disease. 2.  No displaced fracture or dislocation of the facial bones. 3. There is a mildly displaced posterior arch fracture of C1 (series 3, image 37). The anterior elements of C1 are intact. 4. There is a mildly displaced fracture of the base of the dens extending to involve the bilateral lateral masses and vertebral foramina of C2, (series 3, image 32). These results were called by telephone to Dr. Stark Jock at the time of interpretation on 07/20/2019 at 7:40 p.m. Electronically Signed   By: Eddie Candle M.D.   On: 07/20/2019 19:41   CT Cervical  Spine Wo Contrast  Result Date: 07/20/2019 CLINICAL DATA:  Fall, neck pain EXAM: CT HEAD WITHOUT CONTRAST CT MAXILLOFACIAL WITHOUT CONTRAST CT CERVICAL SPINE WITHOUT CONTRAST TECHNIQUE: Multidetector CT imaging of the head, cervical spine, and maxillofacial structures were performed using the standard protocol without intravenous contrast. Multiplanar CT image reconstructions of the cervical spine and maxillofacial structures were also generated. COMPARISON:  None. FINDINGS: CT HEAD FINDINGS Brain: No evidence of acute infarction, hemorrhage, hydrocephalus, extra-axial collection or mass lesion/mass effect. Periventricular and deep white matter hypodensity. Vascular: No hyperdense vessel or unexpected calcification. CT FACIAL BONES FINDINGS Skull: Normal. Negative for fracture or focal lesion. Facial bones: No displaced fractures or dislocations. Sinuses/Orbits: No acute finding. Other: None. CT CERVICAL SPINE FINDINGS Alignment: Normal. Skull base and vertebrae: There is a mildly displaced posterior arch fracture of C1 (series 3, image 37). The anterior elements of C1 are intact. There is a mildly displaced fracture of the base of  the dens extending to involve the bilateral lateral masses and vertebral foramina of C2, (series 3, image 32). No primary bone lesion or focal pathologic process. Soft tissues and spinal canal: No prevertebral fluid or swelling. No visible canal hematoma. Disc levels: Moderate multilevel disc space height loss and osteophytosis. Upper chest: Negative. Other: There is a very large left thyroid goiter, incompletely imaged and extending into the mediastinum, measuring at least 6 cm (series 8, image 20, series 3, image 82) IMPRESSION: 1. No acute intracranial pathology. Small-vessel white matter disease. 2.  No displaced fracture or dislocation of the facial bones. 3. There is a mildly displaced posterior arch fracture of C1 (series 3, image 37). The anterior elements of C1 are intact. 4. There is a mildly displaced fracture of the base of the dens extending to involve the bilateral lateral masses and vertebral foramina of C2, (series 3, image 32). These results were called by telephone to Dr. Stark Jock at the time of interpretation on 07/20/2019 at 7:40 p.m. Electronically Signed   By: Eddie Candle M.D.   On: 07/20/2019 19:41   MR Cervical Spine Wo Contrast  Result Date: 07/20/2019 CLINICAL DATA:  85 year old female status post fall with C1 and C2 fractures on cervical spine CT earlier tonight. EXAM: MRI CERVICAL SPINE WITHOUT CONTRAST TECHNIQUE: Multiplanar, multisequence MR imaging of the cervical spine was performed. No intravenous contrast was administered. COMPARISON:  Cervical spine CT 1912 hours. FINDINGS: Alignment: Stable alignment and straightening of lordosis from the earlier CT. Mild anterolisthesis of C4 on C5 and C7 on T1 appears stable. Vertebrae: Faint marrow edema in the abnormal C1 and C2 vertebrae, including along the vertical fracture line of the posterior C2 body on series 7, image 9. Normal background bone marrow signal. Superimposed degenerative endplate changes in the cervical spine. Visible  upper thoracic vertebral bodies appear intact. Cord: No cervical spinal cord signal abnormality despite multilevel degenerative cord mass effect detailed below. No intraspinal hematoma identified. Posterior Fossa, vertebral arteries, paraspinal tissues: Cervicomedullary junction is within normal limits. Partially visible chronic appearing lacunar infarct in the superior pons. Negative visible cerebellum. Preserved major vascular flow voids in the neck. The right vertebral artery appears mildly dominant. Abnormal posterior interspinous and muscle T2 and STIR hyperintensity at the skull base, C1-C2 and C2-C3 (series 7 image 7). Left greater than right lateral paraspinal muscle signal abnormality at C2-C3 on series 11, image 14. Superimposed abnormal prevertebral fluid or edema, and possible discontinuity of the anterior longitudinal ligament at C2-C3. Incidentally noted large left thyroid goiter on series  11 image 39 with some rightward shift of the trachea and esophagus. Disc levels: Superimposed degenerative cervical spine changes: C2-C3: Mild to moderate facet hypertrophy greater on the left. Mild left C3 foraminal stenosis. C3-C4: Multifactorial mild spinal stenosis and mild spinal cord mass effect with right eccentric circumferential disc osteophyte complex and mild to moderate posterior element hypertrophy. Mild left and moderate to severe right C4 foraminal stenosis. C4-C5: Multifactorial spinal stenosis with mild to moderate spinal cord mass effect related to disc osteophyte complex with bulky posterior component of disc (series 11, image 22), and moderate to severe posterior element hypertrophy. Moderate to severe bilateral C5 foraminal stenosis. C5-C6: Multifactorial spinal stenosis with mild spinal cord mass effect related to left eccentric circumferential disc osteophyte complex and mild to moderate posterior element hypertrophy. Moderate to severe left and mild-to-moderate right C6 foraminal stenosis.  C6-C7: Multifactorial spinal stenosis with up to mild spinal cord mass effect related to severe disc space loss with circumferential disc osteophyte complex and mild to moderate posterior element hypertrophy. Moderate to severe left and mild to moderate right C7 foraminal stenosis. C7-T1: Moderate to severe facet hypertrophy but minimal disc bulging. No spinal stenosis. Mild C8 foraminal stenosis. No upper thoracic spinal stenosis despite disc and posterior element degeneration. IMPRESSION: 1. C1 and C2 fractures as demonstrated by CT with superimposed anterior and posterior ligamentous injury, including suspected ALL injury at C2-C3. 2. No acute traumatic injury to the spinal cord despite widespread degenerative spinal stenosis with cord mass effect C3-C4 through C6-C7. No definite cord signal abnormality. 3. Left greater than right superior and suboccipital paraspinal muscle injury. 4. Incidental large left thyroid goiter. 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 Electronically Signed   By: Genevie Ann M.D.   On: 07/20/2019 23:27   CT Maxillofacial Wo Contrast  Result Date: 07/20/2019 CLINICAL DATA:  Fall, neck pain EXAM: CT HEAD WITHOUT CONTRAST CT MAXILLOFACIAL WITHOUT CONTRAST CT CERVICAL SPINE WITHOUT CONTRAST TECHNIQUE: Multidetector CT imaging of the head, cervical spine, and maxillofacial structures were performed using the standard protocol without intravenous contrast. Multiplanar CT image reconstructions of the cervical spine and maxillofacial structures were also generated. COMPARISON:  None. FINDINGS: CT HEAD FINDINGS Brain: No evidence of acute infarction, hemorrhage, hydrocephalus, extra-axial collection or mass lesion/mass effect. Periventricular and deep white matter hypodensity. Vascular: No hyperdense vessel or unexpected calcification. CT FACIAL BONES FINDINGS Skull: Normal. Negative for fracture or focal lesion. Facial bones: No displaced fractures or dislocations. Sinuses/Orbits: No  acute finding. Other: None. CT CERVICAL SPINE FINDINGS Alignment: Normal. Skull base and vertebrae: There is a mildly displaced posterior arch fracture of C1 (series 3, image 37). The anterior elements of C1 are intact. There is a mildly displaced fracture of the base of the dens extending to involve the bilateral lateral masses and vertebral foramina of C2, (series 3, image 32). No primary bone lesion or focal pathologic process. Soft tissues and spinal canal: No prevertebral fluid or swelling. No visible canal hematoma. Disc levels: Moderate multilevel disc space height loss and osteophytosis. Upper chest: Negative. Other: There is a very large left thyroid goiter, incompletely imaged and extending into the mediastinum, measuring at least 6 cm (series 8, image 20, series 3, image 82) IMPRESSION: 1. No acute intracranial pathology. Small-vessel white matter disease. 2.  No displaced fracture or dislocation of the facial bones. 3. There is a mildly displaced posterior arch fracture of C1 (series 3, image 37). The anterior elements of C1 are intact. 4. There is a mildly displaced  fracture of the base of the dens extending to involve the bilateral lateral masses and vertebral foramina of C2, (series 3, image 32). These results were called by telephone to Dr. Stark Jock at the time of interpretation on 07/20/2019 at 7:40 p.m. Electronically Signed   By: Eddie Candle M.D.   On: 07/20/2019 19:41    EKG: Independently reviewed. No EKG done  Assessment/Plan Active Problems:   C1 cervical fracture (HCC)   Hypothyroidism  (please populate well all problems here in Problem List. (For example, if patient is on BP meds at home and you resume or decide to hold them, it is a problem that needs to be her. Same for CAD, COPD, HLD and so on)   1. Cervical fracture - findings as outlined above. Plan Observation admit to med-surg  Continue aspen collar  PT/OT eval  Pain mgt  DVT prophylaxis: lovenox  (Lovenox/Heparin/SCD's/anticoagulated/None (if comfort care) Code Status: full code (Full/Partial (specify details) Family Communication: left message for husband (Specify name, relationship. Do not write "discussed with patient". Specify tel # if discussed over the phone) Disposition Plan: home 24 hrs (specify when and where you expect patient to be discharged) Consults called: NS - Dr. Saintclair Halsted (with names) Admission status: observation (inpatient / obs / tele / medical floor / SDU)   Adella Hare MD [facility] Pager [phone]  If 7PM-7AM, please contact night-coverage [email or web address] Password [email or web address]  07/21/2019, 1:18 AM

## 2019-07-21 NOTE — Evaluation (Signed)
Occupational Therapy Evaluation Patient Details Name: Kimberly Webb MRN: [id number] DOB: [birth year] Today's Date: 07/21/2019    History of Present Illness Kimberly Webb is a 86 y.o. female with PMH significant of osteoporosis who fell yesterday and started experiencing severe neck pain 2/15 .CT neck revealed bilateral posterior lamina fractures of C1 as well as a C2 body fracture and fractures of both pars of C2. MRI reveals ligamentous injury including the C2C3 level.; right superior and suboccipital paraspinal muscle injury.   Clinical Impression   Prior to fall, Pt was mod I with RW. She was driving, cooking, cleaning, and performing all ADL. Today she is anxious with any movement and fearful of pain. Overall she is mod A for bed mobility (education for log roll technique and sidelying to sit EOB), mod to max A for LB ADL (she cannot access her body below the knee in sitting), dependent on the RW in standing (requires sitting for grooming tasks). She was able to perfom peri care with lateral leans after toilet transfer, but will need additional education for AE for rear peri care. At this time recommending HHOT post-acute and another night in acute care as Pt requires further education and reinforcement of cervical precautions during functional transfers and ADL. Next session to bring AE demo kit for LB ADL and toilet aide for rear peri care.     Follow Up Recommendations  Home health OT;Supervision/Assistance - 24 hour    Equipment Recommendations  [place] Collar for showering   Recommendations for Other Services       Precautions / Restrictions Precautions Precautions: Cervical Precaution Booklet Issued: Yes (comment) Precaution Comments: conservative management Required Braces or Orthoses: Cervical Brace Cervical Brace: Hard collar Restrictions Weight Bearing Restrictions: No Other Position/Activity Restrictions: cervical biomechanical precautions      Mobility Bed  Mobility Overal bed mobility: Needs Assistance Bed Mobility: Rolling;Sidelying to Sit;Sit to Sidelying Rolling: Mod assist(cues for sequencing) Sidelying to sit: Mod assist;HOB elevated(20 degrees)     Sit to sidelying: Mod assist(bed flat) General bed mobility comments: cues for safe biomechanics, assist for trunk elevation, and assist for legs returning supine.   Transfers Overall transfer level: Needs assistance Equipment used: Rolling walker (2 wheeled) Transfers: Sit to/from Stand Sit to Stand: Mod assist;Min assist(initially mod, progressed to min )         General transfer comment: anxious about mobility, took increased time to initiate movement, able to improve with transfer each time    Balance Overall balance assessment: Needs assistance Sitting-balance support: Bilateral upper extremity supported;Feet supported Sitting balance-Leahy Scale: Fair Sitting balance - Comments: EOB without LOB   Standing balance support: Bilateral upper extremity supported Standing balance-Leahy Scale: Poor Standing balance comment: dependent on BUE on RW                           ADL either performed or assessed with clinical judgement   ADL Overall ADL's : Needs assistance/impaired Eating/Feeding: Set up;Sitting   Grooming: Wash/dry hands;Wash/dry face;Minimal assistance;Cueing for safety;Cueing for compensatory techniques;Standing Grooming Details (indicate cue type and reason): educated in cup method Upper Body Bathing: Moderate assistance   Lower Body Bathing: Maximal assistance Lower Body Bathing Details (indicate cue type and reason): knees down will need assist Upper Body Dressing : Maximal assistance;Bed level Upper Body Dressing Details (indicate cue type and reason): practiced don/doff with brace - would like grandson to be able to get practice -  Lower Body Dressing: Maximal  assistance;Sitting/lateral leans Lower Body Dressing Details (indicate cue type and  reason): unable to reach feet for LB ADL Toilet Transfer: Minimal assistance Toilet Transfer Details (indicate cue type and reason): min A for bost, min guard for safety with RW Toileting- Clothing Manipulation and Hygiene: Minimal assistance;Sit to/from stand Toileting - Clothing Manipulation Details (indicate cue type and reason): for underwear management knees down, able to perform front peri care Tub/ Shower Transfer: Moderate assistance Tub/Shower Transfer Details (indicate cue type and reason): has to be able to step over tub, talked about safety with bathing and brace management and doing it at bed level for comfort Functional mobility during ADLs: Minimal assistance;Cueing for safety;Rolling walker General ADL Comments: Pt with decreased access to LB for ADL, education for brace management, compensatory strategies etc but WILL NEED MORE EDUCATION PRIOR TO DC     Vision Baseline Vision/History: Wears glasses Wears Glasses: At all times Patient Visual Report: No change from baseline Vision Assessment?: No apparent visual deficits     Perception     Praxis      Pertinent Vitals/Pain Pain Assessment: 0-10 Pain Score: 5  Pain Location: neck, headache Pain Descriptors / Indicators: Headache;Discomfort;Grimacing;Sore Pain Intervention(s): Limited activity within patient's tolerance;Monitored during session;Repositioned     Hand Dominance Right   Extremity/Trunk Assessment Upper Extremity Assessment Upper Extremity Assessment: Overall WFL for tasks assessed   Lower Extremity Assessment Lower Extremity Assessment: Overall WFL for tasks assessed;Defer to PT evaluation   Cervical / Trunk Assessment Cervical / Trunk Assessment: Other exceptions Cervical / Trunk Exceptions: in apsen collar throughout, conservative fx management   Communication Communication Communication: No difficulties   Cognition Arousal/Alertness: Awake/alert Behavior During Therapy: Anxious Overall  Cognitive Status: Within Functional Limits for tasks assessed                                 General Comments: anxious about pain caused with movement, requires assist to initiate tasks   General Comments       Exercises     Shoulder Instructions      Home Living Family/patient expects to be discharged to:: Private residence Living Arrangements: Spouse/significant other;Other relatives(grandson) Available Help at Discharge: Family;Available 24 hours/day Type of Home: House Home Access: Stairs to enter [employer] of Steps: 1 Entrance Stairs-Rails: None Home Layout: One level     Bathroom Shower/Tub: Teacher, early years/pre: Handicapped height Bathroom Accessibility: Yes How Accessible: Accessible via walker Home Equipment: [place] - 4 wheels;Shower seat;Grab bars - tub/shower;Grab bars - toilet;Wheelchair - manual;Cane - single point;Bedside commode          Prior Functioning/Environment Level of Independence: Independent with assistive device(s)        Comments: used a rollator at baseline, drives still, does cooking and cleaning and grocery shopping        OT Problem List: Decreased range of motion;Decreased activity tolerance;Impaired balance (sitting and/or standing);Decreased safety awareness;Decreased knowledge of use of DME or AE;Decreased knowledge of precautions;Pain      OT Treatment/Interventions: Self-care/ADL training;Energy conservation;DME and/or AE instruction;Therapeutic activities;Patient/family education;Balance training    OT Goals(Current goals can be found in the care plan section) Acute Rehab OT Goals Patient Stated Goal: to decrease pain, and not go SNF OT Goal Formulation: With patient Time For Goal Achievement: 08/04/19 Potential to Achieve Goals: Good ADL Goals Pt Will Perform Grooming: with supervision;standing Pt Will Perform Upper Body Dressing: with min guard assist;sitting Pt  Will Perform Lower  Body Dressing: with min assist;with caregiver independent in assisting;sit to/from stand Pt Will Transfer to Toilet: with modified independence;ambulating Pt Will Perform Toileting - Clothing Manipulation and hygiene: with min guard assist;with adaptive equipment;sit to/from stand Additional ADL Goal #1: Pt will perform bed mobility at min A demonstrating log roll and sidelying to sit technique for cervical biomechanics prior to engaging in ADL  OT Frequency: Min 2X/week   Barriers to D/C:            Co-evaluation PT/OT/SLP Co-Evaluation/Treatment: Yes Reason for Co-Treatment: For patient/therapist safety;To address functional/ADL transfers PT goals addressed during session: Mobility/safety with mobility;Balance;Proper use of DME OT goals addressed during session: ADL's and self-care;Proper use of Adaptive equipment and DME;Strengthening/ROM      AM-PAC OT "6 Clicks" Daily Activity     Outcome Measure Help from another person eating meals?: A Little Help from another person taking care of personal grooming?: A Little Help from another person toileting, which includes using toliet, bedpan, or urinal?: A Little Help from another person bathing (including washing, rinsing, drying)?: A Lot Help from another person to put on and taking off regular upper body clothing?: A Lot Help from another person to put on and taking off regular lower body clothing?: A Lot 6 Click Score: 15   End of Session Equipment Utilized During Treatment: Rolling walker;Gait belt;Cervical collar Nurse Communication: Mobility status;Patient requests pain meds;Precautions  Activity Tolerance: Patient tolerated treatment well(anxious) Patient left: in bed;with call bell/phone within reach  OT Visit Diagnosis: Unsteadiness on feet (R26.81);Other abnormalities of gait and mobility (R26.89);History of falling (Z91.81);Pain Pain - part of body: (neck/cervical spine and headache)                Time: 9774-1423 OT  Time Calculation (min): 32 min Charges:  OT General Charges $OT Visit: 1 Visit OT Evaluation $OT Eval Moderate Complexity: [place] OTR/L Acute Rehabilitation Services Pager: [phone] Office: [place] 07/21/2019, 10:22 AM

## 2019-07-21 NOTE — Plan of Care (Signed)
Plan of care reviewed with pt at bedside. PT/OT worked with pt. Pt assist x1 with walker. Pain controlled with medications per orders. Call bell in reach. Safety measures in place. Will continue to monitor.  Problem: Education: Goal: Knowledge of General Education information will improve Description: Including pain rating scale, medication(s)/side effects and non-pharmacologic comfort measures Outcome: Progressing   Problem: Health Behavior/Discharge Planning: Goal: Ability to manage health-related needs will improve Outcome: Progressing   Problem: Clinical Measurements: Goal: Ability to maintain clinical measurements within normal limits will improve Outcome: Progressing Goal: Will remain free from infection Outcome: Progressing Goal: Diagnostic test results will improve Outcome: Progressing Goal: Respiratory complications will improve Outcome: Progressing Goal: Cardiovascular complication will be avoided Outcome: Progressing   Problem: Activity: Goal: Risk for activity intolerance will decrease Outcome: Progressing   Problem: Nutrition: Goal: Adequate nutrition will be maintained Outcome: Progressing   Problem: Coping: Goal: Level of anxiety will decrease Outcome: Progressing   Problem: Elimination: Goal: Will not experience complications related to bowel motility Outcome: Progressing Goal: Will not experience complications related to urinary retention Outcome: Progressing   Problem: Pain Managment: Goal: General experience of comfort will improve Outcome: Progressing   Problem: Safety: Goal: Ability to remain free from injury will improve Outcome: Progressing   Problem: Skin Integrity: Goal: Risk for impaired skin integrity will decrease Outcome: Progressing

## 2019-07-21 NOTE — Evaluation (Signed)
Physical Therapy Evaluation Patient Details Name: Kimberly Webb MRN: [id number] DOB: 07-12-1932 Today's Date: 07/21/2019   History of Present Illness  Kimberly Webb is a 86 y.o. female with PMH significant of osteoporosis who fell yesterday and started experiencing severe neck pain 2/15 .CT neck revealed bilateral posterior lamina fractures of C1 as well as a C2 body fracture and fractures of both pars of C2. MRI reveals ligamentous injury including the C2C3 level.; right superior and suboccipital paraspinal muscle injury.  Clinical Impression  Pt admitted with above diagnosis. Pt appropriate for HHPT level therapy but would benefit from one more day/ therapy session tomorrow morning to be ready for d/c home. Pt very anxious about mobility due to her neck pain, required mod A for initial mvmt, progressing to min A with practice.  Pt currently with functional limitations due to the deficits listed below (see PT Problem List). Pt will benefit from skilled PT to increase their independence and safety with mobility to allow discharge to the venue listed below.       Follow Up Recommendations Home health PT    Equipment Recommendations  None recommended by PT    Recommendations for Other Services       Precautions / Restrictions Precautions Precautions: Cervical Precaution Booklet Issued: Yes (comment) Precaution Comments: reviewed precautions and donning and doffing of brace in supine as pt had more pain in sitting Required Braces or Orthoses: Cervical Brace Cervical Brace: Hard collar Restrictions Weight Bearing Restrictions: No Other Position/Activity Restrictions: cervical biomechanical precautions      Mobility  Bed Mobility Overal bed mobility: Needs Assistance Bed Mobility: Rolling;Sidelying to Sit;Sit to Sidelying Rolling: Mod assist(cues for sequencing) Sidelying to sit: Mod assist;HOB elevated(20 degrees)     Sit to sidelying: Mod assist(bed flat) General bed  mobility comments: cues for safe biomechanics, assist for trunk elevation, and assist for legs returning supine.   Transfers Overall transfer level: Needs assistance Equipment used: Rolling walker (2 wheeled) Transfers: Sit to/from Stand Sit to Stand: Mod assist;Min assist(initially mod, progressed to min )         General transfer comment: anxious about mobility, took increased time to initiate movement, able to improve with transfer each time  Ambulation/Gait Ambulation/Gait assistance: Min assist Gait Distance (Feet): 20 Feet Assistive device: Rolling walker (2 wheeled) Gait Pattern/deviations: Step-through pattern;Decreased stride length Gait velocity: decreased Gait velocity interpretation: <1.31 ft/sec, indicative of household ambulator General Gait Details: pt with slow, cautious mvmt, very fearful of pain but once up, reported that pain was not significantly higher than in sitting  Stairs            Wheelchair Mobility    Modified Rankin (Stroke Patients Only)       Balance Overall balance assessment: Needs assistance Sitting-balance support: Bilateral upper extremity supported;Feet supported Sitting balance-Leahy Scale: Fair Sitting balance - Comments: EOB without LOB   Standing balance support: Bilateral upper extremity supported Standing balance-Leahy Scale: Poor Standing balance comment: dependent on BUE on RW                             Pertinent Vitals/Pain Pain Assessment: 0-10 Pain Score: 5  Pain Location: neck, headache Pain Descriptors / Indicators: Headache;Discomfort;Grimacing;Sore Pain Intervention(s): Limited activity within patient's tolerance;Monitored during session;Premedicated before session    Home Living Family/patient expects to be discharged to:: Private residence Living Arrangements: Spouse/significant other;Other relatives(grandson) Available Help at Discharge: Family;Available 24 hours/day Type of Home:  House Home Access: Stairs to enter Entrance Stairs-Rails: None Entrance Stairs-Number of Steps: 1 Home Layout: One level Home Equipment: [place] - 4 wheels;Shower seat;Grab bars - tub/shower;Grab bars - toilet;Wheelchair - manual;Cane - single point;Bedside commode      Prior Function Level of Independence: Independent with assistive device(s)         Comments: used a rollator at baseline, drives still, does cooking and cleaning and grocery shopping     Hand Dominance   Dominant Hand: Right    Extremity/Trunk Assessment   Upper Extremity Assessment Upper Extremity Assessment: Defer to OT evaluation    Lower Extremity Assessment Lower Extremity Assessment: Overall WFL for tasks assessed    Cervical / Trunk Assessment Cervical / Trunk Assessment: Other exceptions Cervical / Trunk Exceptions: in apsen collar throughout, conservative fx management  Communication   Communication: No difficulties  Cognition Arousal/Alertness: Awake/alert Behavior During Therapy: Anxious Overall Cognitive Status: Within Functional Limits for tasks assessed                                 General Comments: anxious about pain caused with movement, requires assist to initiate tasks      General Comments General comments (skin integrity, edema, etc.): discussed importance of mobility during the time that her neck is healing. Pt relays wanting to be medicated to the point that it doesn't hurt anymore. Discussed the negative side effects of pain meds and that pain is a natural safety reaction of the body and a certain amount is to be expected during the course of this injury    Exercises     Assessment/Plan    PT Assessment Patient needs continued PT services  PT Problem List Decreased activity tolerance;Decreased balance;Decreased mobility;Decreased cognition;Decreased knowledge of precautions;Pain       PT Treatment Interventions DME instruction;Gait training;Stair  training;Functional mobility training;Therapeutic activities;Therapeutic exercise;Balance training;Patient/family education    PT Goals (Current goals can be found in the Care Plan section)  Acute Rehab PT Goals Patient Stated Goal: to decrease pain, and not go SNF PT Goal Formulation: With patient Time For Goal Achievement: 07/28/19 Potential to Achieve Goals: Good    Frequency Min 3X/week   Barriers to discharge        Co-evaluation PT/OT/SLP Co-Evaluation/Treatment: Yes Reason for Co-Treatment: Complexity of the patient's impairments (multi-system involvement);For patient/therapist safety PT goals addressed during session: Mobility/safety with mobility;Balance;Proper use of DME OT goals addressed during session: ADL's and self-care;Proper use of Adaptive equipment and DME;Strengthening/ROM       AM-PAC PT "6 Clicks" Mobility  Outcome Measure Help needed turning from your back to your side while in a flat bed without using bedrails?: A Lot Help needed moving from lying on your back to sitting on the side of a flat bed without using bedrails?: A Lot Help needed moving to and from a bed to a chair (including a wheelchair)?: A Lot Help needed standing up from a chair using your arms (e.g., wheelchair or bedside chair)?: A Little Help needed to walk in hospital room?: A Little Help needed climbing 3-5 steps with a railing? : A Little 6 Click Score: 15    End of Session Equipment Utilized During Treatment: Gait belt;Cervical collar Activity Tolerance: Other (comment)(limited by anxiety) Patient left: in bed;with call bell/phone within reach;with bed alarm set Nurse Communication: Mobility status PT Visit Diagnosis: Unsteadiness on feet (R26.81);Pain;Difficulty in walking, not elsewhere classified (R26.2) Pain - part of  body: (neck and head)    Time: [id number] PT Time Calculation (min) (ACUTE ONLY): 47 min   Charges:   PT Evaluation $PT Eval Moderate Complexity: 1 Mod PT  Treatments $Gait Training: 8-22 mins        Leighton Roach, [place]  Pager [phone] Office [place] 07/21/2019, 10:39 AM

## 2019-07-22 ENCOUNTER — Encounter (HOSPITAL_COMMUNITY): Payer: Self-pay | Admitting: Internal Medicine

## 2019-07-22 DIAGNOSIS — S12000A Unspecified displaced fracture of first cervical vertebra, initial encounter for closed fracture: Principal | ICD-10-CM

## 2019-07-22 MED ORDER — TRAMADOL HCL 50 MG PO TABS: 50.0000 mg | ORAL_TABLET | Freq: Four times a day (QID) | ORAL | 0 refills | Status: DC | PRN

## 2019-07-22 NOTE — TOC Progression Note (Addendum)
Transition of Care [facility]) - Progression Note    Patient Details  Name: ORPHA OKABE MRN: [id number] Date of Birth: [birth year]  Transition of Care [facility]) CM/SW Contact  Jacalyn Lefevre Edson Snowball, RN Phone Number: 07/22/2019, 11:11 AM  Clinical Narrative:    Spoke to patient at bedside. Patient has chosen Nurse, learning disability for home health PT and OT. Patient has walker at home , needs 3 in 1 same ordered. Tommi Rumps with [facility] accepted referral. Lyanne Co with [place] for 3 in 1  Expected Discharge Plan: [place] Barriers to Discharge: Continued Medical Work up  Expected Discharge Plan and Services Expected Discharge Plan: [place] In-house Referral: Clinical Social Work Discharge Planning Services: CM Consult Post Acute Care Choice: Home Health, Durable Medical Equipment Living arrangements for the past 2 months: Single Family Home                 DME Arranged: Other see comment([place] collar for showering) DME Agency: Other - Comment(ortho tech at hospital) Date DME Agency Contacted: 07/21/19 Time DME Agency Contacted: [facility] Arranged: PT, OT           Social Determinants of Health (SDOH) Interventions    Readmission Risk Interventions No flowsheet data found.

## 2019-07-22 NOTE — Discharge Summary (Signed)
Physician Discharge Summary  MORENIKE PERCELL [id number] DOB: 12/13/1932 DOA: 07/20/2019  PCP: Alroy Dust, L.Marlou Sa, MD  Admit date: 07/20/2019 Discharge date: 07/22/2019  Admitted From: Home Discharge disposition: Home   Recommendations for Outpatient Follow-Up:   1. Outpatient follow-up with neurosurgery with repeat x-rays in 1 week 2. Home health   Discharge Diagnosis:   Active Problems:   Hypothyroidism   C1 cervical fracture ([place])   Neck fracture ([place])    Discharge Condition: Improved.  Diet recommendation: Low sodium, heart healthy  Wound care: None.  Code status: Full.   History of Present Illness:   Kimberly Webb is a 86 y.o. female with medical history significant of osteoporosis who fell yesterday and started experiencing severe neck pain today while she was brushing her teeth and came to the ER.She denies ever experiencing any difficulty with her arms or legs or numbness or tingling   Hospital Course by Problem:   C1-C2 fracture  -MRI scan shows good alignment does raise the hint of in a LL injury although not definitive there is certainly some posterior ligament injury  -Per neurosurgery there is a chance is good healing a collar and she would certainly prefer nonsurgical management I -wear collar 100% of the time. -pain control   -[place] collar for showers -home health PT/OT -follow-up in a week for x-rays.    Medical Consultants:   Neurosurgery   Discharge Exam:   Vitals:   07/22/19 0630 07/22/19 0837  BP: 135/65 (!) 150/83  Pulse: 60 66  Resp: 18 18  Temp: 97.7 F (36.5 C) 97.6 F (36.4 C)  SpO2: 92% 92%   Vitals:   07/22/19 0038 07/22/19 0630 07/22/19 0819 07/22/19 0837  BP: (!) 150/62 135/65  (!) 150/83  Pulse: 60 60  66  Resp: 18 18  18   Temp: 97.7 F (36.5 C) 97.7 F (36.5 C)  97.6 F (36.4 C)  TempSrc: Oral Oral  Oral  SpO2: 93% 92%  92%  Weight:   81.3 kg   Height:   5\' 2"  (1.575 m)     General exam:  Appears calm and comfortable.    The results of significant diagnostics from this hospitalization (including imaging, microbiology, ancillary and laboratory) are listed below for reference.     Procedures and Diagnostic Studies:   CT Head Wo Contrast  Result Date: 07/20/2019 CLINICAL DATA:  Fall, neck pain EXAM: CT HEAD WITHOUT CONTRAST CT MAXILLOFACIAL WITHOUT CONTRAST CT CERVICAL SPINE WITHOUT CONTRAST TECHNIQUE: Multidetector CT imaging of the head, cervical spine, and maxillofacial structures were performed using the standard protocol without intravenous contrast. Multiplanar CT image reconstructions of the cervical spine and maxillofacial structures were also generated. COMPARISON:  None. FINDINGS: CT HEAD FINDINGS Brain: No evidence of acute infarction, hemorrhage, hydrocephalus, extra-axial collection or mass lesion/mass effect. Periventricular and deep white matter hypodensity. Vascular: No hyperdense vessel or unexpected calcification. CT FACIAL BONES FINDINGS Skull: Normal. Negative for fracture or focal lesion. Facial bones: No displaced fractures or dislocations. Sinuses/Orbits: No acute finding. Other: None. CT CERVICAL SPINE FINDINGS Alignment: Normal. Skull base and vertebrae: There is a mildly displaced posterior arch fracture of C1 (series 3, image 37). The anterior elements of C1 are intact. There is a mildly displaced fracture of the base of the dens extending to involve the bilateral lateral masses and vertebral foramina of C2, (series 3, image 32). No primary bone lesion or focal pathologic process. Soft tissues and spinal canal: No prevertebral fluid or swelling.  No visible canal hematoma. Disc levels: Moderate multilevel disc space height loss and osteophytosis. Upper chest: Negative. Other: There is a very large left thyroid goiter, incompletely imaged and extending into the mediastinum, measuring at least 6 cm (series 8, image 20, series 3, image 82) IMPRESSION: 1. No acute  intracranial pathology. Small-vessel white matter disease. 2.  No displaced fracture or dislocation of the facial bones. 3. There is a mildly displaced posterior arch fracture of C1 (series 3, image 37). The anterior elements of C1 are intact. 4. There is a mildly displaced fracture of the base of the dens extending to involve the bilateral lateral masses and vertebral foramina of C2, (series 3, image 32). These results were called by telephone to Dr. Stark Jock at the time of interpretation on 07/20/2019 at 7:40 p.m. Electronically Signed   By: Eddie Candle M.D.   On: 07/20/2019 19:41   CT Cervical Spine Wo Contrast  Result Date: 07/20/2019 CLINICAL DATA:  Fall, neck pain EXAM: CT HEAD WITHOUT CONTRAST CT MAXILLOFACIAL WITHOUT CONTRAST CT CERVICAL SPINE WITHOUT CONTRAST TECHNIQUE: Multidetector CT imaging of the head, cervical spine, and maxillofacial structures were performed using the standard protocol without intravenous contrast. Multiplanar CT image reconstructions of the cervical spine and maxillofacial structures were also generated. COMPARISON:  None. FINDINGS: CT HEAD FINDINGS Brain: No evidence of acute infarction, hemorrhage, hydrocephalus, extra-axial collection or mass lesion/mass effect. Periventricular and deep white matter hypodensity. Vascular: No hyperdense vessel or unexpected calcification. CT FACIAL BONES FINDINGS Skull: Normal. Negative for fracture or focal lesion. Facial bones: No displaced fractures or dislocations. Sinuses/Orbits: No acute finding. Other: None. CT CERVICAL SPINE FINDINGS Alignment: Normal. Skull base and vertebrae: There is a mildly displaced posterior arch fracture of C1 (series 3, image 37). The anterior elements of C1 are intact. There is a mildly displaced fracture of the base of the dens extending to involve the bilateral lateral masses and vertebral foramina of C2, (series 3, image 32). No primary bone lesion or focal pathologic process. Soft tissues and spinal canal:  No prevertebral fluid or swelling. No visible canal hematoma. Disc levels: Moderate multilevel disc space height loss and osteophytosis. Upper chest: Negative. Other: There is a very large left thyroid goiter, incompletely imaged and extending into the mediastinum, measuring at least 6 cm (series 8, image 20, series 3, image 82) IMPRESSION: 1. No acute intracranial pathology. Small-vessel white matter disease. 2.  No displaced fracture or dislocation of the facial bones. 3. There is a mildly displaced posterior arch fracture of C1 (series 3, image 37). The anterior elements of C1 are intact. 4. There is a mildly displaced fracture of the base of the dens extending to involve the bilateral lateral masses and vertebral foramina of C2, (series 3, image 32). These results were called by telephone to Dr. Stark Jock at the time of interpretation on 07/20/2019 at 7:40 p.m. Electronically Signed   By: Eddie Candle M.D.   On: 07/20/2019 19:41   MR Cervical Spine Wo Contrast  Result Date: 07/20/2019 CLINICAL DATA:  86 year old female status post fall with C1 and C2 fractures on cervical spine CT earlier tonight. EXAM: MRI CERVICAL SPINE WITHOUT CONTRAST TECHNIQUE: Multiplanar, multisequence MR imaging of the cervical spine was performed. No intravenous contrast was administered. COMPARISON:  Cervical spine CT 1912 hours. FINDINGS: Alignment: Stable alignment and straightening of lordosis from the earlier CT. Mild anterolisthesis of C4 on C5 and C7 on T1 appears stable. Vertebrae: Faint marrow edema in the abnormal C1 and C2 vertebrae,  including along the vertical fracture line of the posterior C2 body on series 7, image 9. Normal background bone marrow signal. Superimposed degenerative endplate changes in the cervical spine. Visible upper thoracic vertebral bodies appear intact. Cord: No cervical spinal cord signal abnormality despite multilevel degenerative cord mass effect detailed below. No intraspinal hematoma identified.  Posterior Fossa, vertebral arteries, paraspinal tissues: Cervicomedullary junction is within normal limits. Partially visible chronic appearing lacunar infarct in the superior pons. Negative visible cerebellum. Preserved major vascular flow voids in the neck. The right vertebral artery appears mildly dominant. Abnormal posterior interspinous and muscle T2 and STIR hyperintensity at the skull base, C1-C2 and C2-C3 (series 7 image 7). Left greater than right lateral paraspinal muscle signal abnormality at C2-C3 on series 11, image 14. Superimposed abnormal prevertebral fluid or edema, and possible discontinuity of the anterior longitudinal ligament at C2-C3. Incidentally noted large left thyroid goiter on series 11 image 39 with some rightward shift of the trachea and esophagus. Disc levels: Superimposed degenerative cervical spine changes: C2-C3: Mild to moderate facet hypertrophy greater on the left. Mild left C3 foraminal stenosis. C3-C4: Multifactorial mild spinal stenosis and mild spinal cord mass effect with right eccentric circumferential disc osteophyte complex and mild to moderate posterior element hypertrophy. Mild left and moderate to severe right C4 foraminal stenosis. C4-C5: Multifactorial spinal stenosis with mild to moderate spinal cord mass effect related to disc osteophyte complex with bulky posterior component of disc (series 11, image 22), and moderate to severe posterior element hypertrophy. Moderate to severe bilateral C5 foraminal stenosis. C5-C6: Multifactorial spinal stenosis with mild spinal cord mass effect related to left eccentric circumferential disc osteophyte complex and mild to moderate posterior element hypertrophy. Moderate to severe left and mild-to-moderate right C6 foraminal stenosis. C6-C7: Multifactorial spinal stenosis with up to mild spinal cord mass effect related to severe disc space loss with circumferential disc osteophyte complex and mild to moderate posterior element  hypertrophy. Moderate to severe left and mild to moderate right C7 foraminal stenosis. C7-T1: Moderate to severe facet hypertrophy but minimal disc bulging. No spinal stenosis. Mild C8 foraminal stenosis. No upper thoracic spinal stenosis despite disc and posterior element degeneration. IMPRESSION: 1. C1 and C2 fractures as demonstrated by CT with superimposed anterior and posterior ligamentous injury, including suspected ALL injury at C2-C3. 2. No acute traumatic injury to the spinal cord despite widespread degenerative spinal stenosis with cord mass effect C3-C4 through C6-C7. No definite cord signal abnormality. 3. Left greater than right superior and suboccipital paraspinal muscle injury. 4. Incidental large left thyroid goiter. 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 Electronically Signed   By: Genevie Ann M.D.   On: 07/20/2019 23:27   CT Maxillofacial Wo Contrast  Result Date: 07/20/2019 CLINICAL DATA:  Fall, neck pain EXAM: CT HEAD WITHOUT CONTRAST CT MAXILLOFACIAL WITHOUT CONTRAST CT CERVICAL SPINE WITHOUT CONTRAST TECHNIQUE: Multidetector CT imaging of the head, cervical spine, and maxillofacial structures were performed using the standard protocol without intravenous contrast. Multiplanar CT image reconstructions of the cervical spine and maxillofacial structures were also generated. COMPARISON:  None. FINDINGS: CT HEAD FINDINGS Brain: No evidence of acute infarction, hemorrhage, hydrocephalus, extra-axial collection or mass lesion/mass effect. Periventricular and deep white matter hypodensity. Vascular: No hyperdense vessel or unexpected calcification. CT FACIAL BONES FINDINGS Skull: Normal. Negative for fracture or focal lesion. Facial bones: No displaced fractures or dislocations. Sinuses/Orbits: No acute finding. Other: None. CT CERVICAL SPINE FINDINGS Alignment: Normal. Skull base and vertebrae: There is a mildly displaced posterior arch  fracture of C1 (series 3, image 37). The anterior  elements of C1 are intact. There is a mildly displaced fracture of the base of the dens extending to involve the bilateral lateral masses and vertebral foramina of C2, (series 3, image 32). No primary bone lesion or focal pathologic process. Soft tissues and spinal canal: No prevertebral fluid or swelling. No visible canal hematoma. Disc levels: Moderate multilevel disc space height loss and osteophytosis. Upper chest: Negative. Other: There is a very large left thyroid goiter, incompletely imaged and extending into the mediastinum, measuring at least 6 cm (series 8, image 20, series 3, image 82) IMPRESSION: 1. No acute intracranial pathology. Small-vessel white matter disease. 2.  No displaced fracture or dislocation of the facial bones. 3. There is a mildly displaced posterior arch fracture of C1 (series 3, image 37). The anterior elements of C1 are intact. 4. There is a mildly displaced fracture of the base of the dens extending to involve the bilateral lateral masses and vertebral foramina of C2, (series 3, image 32). These results were called by telephone to Dr. Stark Jock at the time of interpretation on 07/20/2019 at 7:40 p.m. Electronically Signed   By: Eddie Candle M.D.   On: 07/20/2019 19:41     Labs:   Basic Metabolic Panel: Recent Labs  Lab 07/21/19 0311  NA 141  K 3.8  CL 108  CO2 23  GLUCOSE 99  BUN 20  CREATININE 1.02*  CALCIUM 10.7*   GFR Estimated Creatinine Clearance: 40.6 mL/min (A) (by C-G formula based on SCr of 1.02 mg/dL (H)). Liver Function Tests: No results for input(s): AST, ALT, ALKPHOS, BILITOT, PROT, ALBUMIN in the last 168 hours. No results for input(s): LIPASE, AMYLASE in the last 168 hours. No results for input(s): AMMONIA in the last 168 hours. Coagulation profile No results for input(s): INR, PROTIME in the last 168 hours.  CBC: Recent Labs  Lab 07/21/19 0311  WBC 9.3  HGB 12.6  HCT 38.0  MCV 98.4  PLT 228   Cardiac Enzymes: No results for input(s):  CKTOTAL, CKMB, CKMBINDEX, TROPONINI in the last 168 hours. BNP: Invalid input(s): POCBNP CBG: No results for input(s): GLUCAP in the last 168 hours. D-Dimer No results for input(s): DDIMER in the last 72 hours. Hgb A1c No results for input(s): HGBA1C in the last 72 hours. Lipid Profile No results for input(s): CHOL, HDL, LDLCALC, TRIG, CHOLHDL, LDLDIRECT in the last 72 hours. Thyroid function studies No results for input(s): TSH, T4TOTAL, T3FREE, THYROIDAB in the last 72 hours.  Invalid input(s): FREET3 Anemia work up No results for input(s): VITAMINB12, FOLATE, FERRITIN, TIBC, IRON, RETICCTPCT in the last 72 hours. Microbiology Recent Results (from the past 240 hour(s))  SARS CORONAVIRUS 2 (TAT 6-24 HRS) Nasopharyngeal Nasopharyngeal Swab     Status: None   Collection Time: 07/20/19 11:55 PM   Specimen: Nasopharyngeal Swab  Result Value Ref Range Status   SARS Coronavirus 2 NEGATIVE NEGATIVE Final    Comment: (NOTE) SARS-CoV-2 target nucleic acids are NOT DETECTED. The SARS-CoV-2 RNA is generally detectable in upper and lower respiratory specimens during the acute phase of infection. Negative results do not preclude SARS-CoV-2 infection, do not rule out co-infections with other pathogens, and should not be used as the sole basis for treatment or other patient management decisions. Negative results must be combined with clinical observations, patient history, and epidemiological information. The expected result is Negative. Fact Sheet for Patients: SugarRoll.be Fact Sheet for Healthcare Providers: https://www.woods-mathews.com/ This test is  not yet approved or cleared by the [place] and  has been authorized for detection and/or diagnosis of SARS-CoV-2 by FDA under an Emergency Use Authorization (EUA). This EUA will remain  in effect (meaning this test can be used) for the duration of the COVID-19 declaration under Section 56  4(b)(1) of the Act, 21 U.S.C. section 360bbb-3(b)(1), unless the authorization is terminated or revoked sooner. Performed at [facility], [place] [address] [place], [place] [address]      Discharge Instructions:   Discharge Instructions    Diet - low sodium heart healthy   Complete by: As directed    Discharge instructions   Complete by: As directed    [place] for showering   Increase activity slowly   Complete by: As directed      Allergies as of 07/22/2019   No Known Allergies     Medication List    STOP taking these medications   metroNIDAZOLE 0.75 % vaginal gel Commonly known as: METROGEL     TAKE these medications   acyclovir 400 MG tablet Commonly known as: ZOVIRAX Take 1 tablet (400 mg total) by mouth daily.   amLODipine 5 MG tablet Commonly known as: NORVASC Take 5 mg by mouth daily.   amoxicillin 500 MG capsule Commonly known as: AMOXIL Take 500 mg by mouth 4 (four) times daily as needed. Take with dental procedures   denosumab 60 MG/ML Soln injection Commonly known as: PROLIA Inject 60 mg into the skin every 6 (six) months. Administer in upper arm, thigh, or abdomen   hydrochlorothiazide 25 MG tablet Commonly known as: HYDRODIURIL Take 25 mg by mouth daily.   levothyroxine 100 MCG tablet Commonly known as: SYNTHROID Take 100 mcg by mouth daily before breakfast.   lisinopril 10 MG tablet Commonly known as: ZESTRIL Take 10 mg by mouth daily.   multivitamin capsule Take 1 capsule by mouth daily.   simvastatin 40 MG tablet Commonly known as: ZOCOR Take 40 mg by mouth at bedtime.   traMADol 50 MG tablet Commonly known as: ULTRAM Take 1 tablet (50 mg total) by mouth every 6 (six) hours as needed for moderate pain.            Durable Medical Equipment  (From admission, onward)         Start     Ordered   07/22/19 1111  For home use only DME 3 n 1  Once     07/22/19 1110         Follow-up Information    Care,  [facility] Follow up.   Specialty: Home Health Services Contact information: [place] [address] [phone]        Alroy Dust, L.Marlou Sa, MD Follow up in 1 week(s).   Specialty: Family Medicine Contact information: [address] [place] [address] [phone]        Kary Kos, MD. Schedule an appointment as soon as possible for a visit.   Specialty: Neurosurgery Why: follow-up in a week for x-rays Contact information: [address] [place] [address] [phone]            Time coordinating discharge: 35 minutes  Signed:  Geradine Girt DO  [facility] 07/22/2019, 11:21 AM

## 2019-07-22 NOTE — Progress Notes (Signed)
Physical Therapy Treatment Patient Details Name: Kimberly Webb MRN: [id number] DOB: 21-Jul-1932 Today's Date: 07/22/2019    History of Present Illness Kimberly Webb is a 86 y.o. female with PMH significant of osteoporosis who fell yesterday and started experiencing severe neck pain 2/15 .CT neck revealed bilateral posterior lamina fractures of C1 as well as a C2 body fracture and fractures of both pars of C2. MRI reveals ligamentous injury including the C2C3 level.; right superior and suboccipital paraspinal muscle injury.    PT Comments    Pt is progressing well towards goals. She increased hallway ambulation and required decreased assistance with bed mobilities. With tolieting pt was able to perform pericare with min guard. Current d/c plan remains appropriate.    Follow Up Recommendations  Home health PT     Equipment Recommendations  None recommended by PT    Recommendations for Other Services       Precautions / Restrictions Precautions Precautions: Cervical Precaution Booklet Issued: Yes (comment) Precaution Comments: reviewed precautions Required Braces or Orthoses: Cervical Brace Cervical Brace: Hard collar Restrictions Weight Bearing Restrictions: No Other Position/Activity Restrictions: cervical biomechanical precautions    Mobility  Bed Mobility Overal bed mobility: Needs Assistance Bed Mobility: Rolling;Sidelying to Sit Rolling: Min assist Sidelying to sit: Min assist       General bed mobility comments: Bed mobilities performed with HOB flat. Min physical assist with mod multimodal cueing for technqiue and sequencing  Transfers Overall transfer level: Needs assistance Equipment used: Rolling walker (2 wheeled) Transfers: Sit to/from Stand Sit to Stand: Min assist         General transfer comment: min physical assist with mod cueing for technique and sequencing. Increased time to initiate movement secondary to fear of  pain.  Ambulation/Gait Ambulation/Gait assistance: Min assist Gait Distance (Feet): 175 Feet Assistive device: Rolling walker (2 wheeled) Gait Pattern/deviations: Step-through pattern;Decreased stride length Gait velocity: decreased   General Gait Details: Pt with slow cautious movement secondary to fear of pain. She demonstrated good technique and posture with RW. Cues to allow RW to roll instead of lifting walker to progress forward   Stairs             Wheelchair Mobility    Modified Rankin (Stroke Patients Only)       Balance Overall balance assessment: Needs assistance Sitting-balance support: Bilateral upper extremity supported;Feet supported Sitting balance-Leahy Scale: Fair Sitting balance - Comments: EOB without LOB   Standing balance support: Bilateral upper extremity supported Standing balance-Leahy Scale: Poor Standing balance comment: dependent on BUE on RW                            Cognition Arousal/Alertness: Awake/alert Behavior During Therapy: WFL for tasks assessed/performed Overall Cognitive Status: Within Functional Limits for tasks assessed                                        Exercises      General Comments        Pertinent Vitals/Pain Pain Assessment: Faces Faces Pain Scale: Hurts a little bit Pain Location: neck, headache Pain Descriptors / Indicators: Headache;Discomfort;Grimacing;Sore Pain Intervention(s): Monitored during session;Limited activity within patient's tolerance;Repositioned    Home Living                      Prior Function  PT Goals (current goals can now be found in the care plan section) Acute Rehab PT Goals Patient Stated Goal: to decrease pain, and not go SNF PT Goal Formulation: With patient Time For Goal Achievement: 07/28/19 Potential to Achieve Goals: Good Progress towards PT goals: Progressing toward goals    Frequency    Min 3X/week       PT Plan Current plan remains appropriate    Co-evaluation              AM-PAC PT "6 Clicks" Mobility   Outcome Measure  Help needed turning from your back to your side while in a flat bed without using bedrails?: A Little Help needed moving from lying on your back to sitting on the side of a flat bed without using bedrails?: A Lot Help needed moving to and from a bed to a chair (including a wheelchair)?: A Little Help needed standing up from a chair using your arms (e.g., wheelchair or bedside chair)?: A Little Help needed to walk in hospital room?: A Little Help needed climbing 3-5 steps with a railing? : A Little 6 Click Score: 17    End of Session Equipment Utilized During Treatment: Gait belt;Cervical collar Activity Tolerance: Patient tolerated treatment well Patient left: with call bell/phone within reach;in chair Nurse Communication: Mobility status PT Visit Diagnosis: Unsteadiness on feet (R26.81);Pain;Difficulty in walking, not elsewhere classified (R26.2) Pain - part of body: (neck and head)     Time: [id number] PT Time Calculation (min) (ACUTE ONLY): 31 min  Charges:  $Gait Training: 8-22 mins $Therapeutic Activity: 8-22 mins                     Benjiman Core, [place] Pager [phone] Acute Rehab   Allena Katz 07/22/2019, 10:35 AM

## 2019-07-22 NOTE — Progress Notes (Signed)
Occupational Therapy Treatment Patient Details Name: Kimberly Webb MRN: [id number] DOB: 23-Aug-1932 Today's Date: 07/22/2019    History of present illness AKIYA Webb is a 86 y.o. female with PMH significant of osteoporosis who fell yesterday and started experiencing severe neck pain 2/15 .CT neck revealed bilateral posterior lamina fractures of C1 as well as a C2 body fracture and fractures of both pars of C2. MRI reveals ligamentous injury including the C2C3 level.; right superior and suboccipital paraspinal muscle injury.   OT comments  Pt progressing towards OT goals this session, min A for transfers and mobility with RW. Today focused on compensatory strategies for ADL, as well as AE kit (provided one). Educated on don/doff of [place]. Pt continues to require cues and mod to min A for ADL tasks and will require HHOT after discharge.    Follow Up Recommendations  Home health OT;Supervision/Assistance - 24 hour    Equipment Recommendations  3 in 1 bedside commode;Other (comment)([place] Collar)    Recommendations for Other Services      Precautions / Restrictions Precautions Precautions: Cervical Precaution Booklet Issued: Yes (comment) Precaution Comments: reviewed precautions Required Braces or Orthoses: Cervical Brace Cervical Brace: Hard collar Restrictions Weight Bearing Restrictions: No Other Position/Activity Restrictions: cervical biomechanical precautions       Mobility Bed Mobility Overal bed mobility: Needs Assistance Bed Mobility: Rolling;Sidelying to Sit Rolling: Min assist Sidelying to sit: Min assist       General bed mobility comments: OOB in recliner at beginning and end of session  Transfers Overall transfer level: Needs assistance Equipment used: Rolling walker (2 wheeled) Transfers: Sit to/from Stand Sit to Stand: Min assist         General transfer comment: min physical assist with mod cueing for technique and  sequencing. Increased time to initiate movement secondary to fear of pain.    Balance Overall balance assessment: Needs assistance Sitting-balance support: Bilateral upper extremity supported;Feet supported Sitting balance-Leahy Scale: Fair Sitting balance - Comments: EOB without LOB   Standing balance support: Bilateral upper extremity supported Standing balance-Leahy Scale: Poor Standing balance comment: dependent on BUE on RW                           ADL either performed or assessed with clinical judgement   ADL Overall ADL's : Needs assistance/impaired     Grooming: Oral care;Wash/dry face;Wash/dry hands;Sitting Grooming Details (indicate cue type and reason): in recliner Upper Body Bathing: With adaptive equipment   Lower Body Bathing: With adaptive equipment       Lower Body Dressing: Moderate assistance;Sit to/from stand Lower Body Dressing Details (indicate cue type and reason): educated in AE Toilet Transfer: [place] guard;Minimal assistance;Ambulation;RW   Toileting- Clothing Manipulation and Hygiene: Min guard;Sitting/lateral lean Toileting - Clothing Manipulation Details (indicate cue type and reason): educated on rear peri care with toilet aide as well     Functional mobility during ADLs: Min guard;Rolling walker General ADL Comments: AE education with demonstration provided.      Vision       Perception     Praxis      Cognition Arousal/Alertness: Awake/alert Behavior During Therapy: WFL for tasks assessed/performed Overall Cognitive Status: Within Functional Limits for tasks assessed  Exercises     Shoulder Instructions       General Comments      Pertinent Vitals/ Pain       Pain Assessment: 0-10 Pain Score: 4  Faces Pain Scale: Hurts a little bit Pain Location: neck, headache Pain Descriptors / Indicators: Headache;Discomfort;Grimacing;Sore Pain Intervention(s):  Monitored during session;Limited activity within patient's tolerance;Repositioned  Home Living                                          Prior Functioning/Environment              Frequency  Min 2X/week        Progress Toward Goals  OT Goals(current goals can now be found in the care plan section)  Progress towards OT goals: Progressing toward goals  Acute Rehab OT Goals Patient Stated Goal: to decrease pain, and not go SNF OT Goal Formulation: With patient Time For Goal Achievement: 08/04/19 Potential to Achieve Goals: Good  Plan Discharge plan remains appropriate;Frequency remains appropriate;Equipment recommendations need to be updated    Co-evaluation                 AM-PAC OT "6 Clicks" Daily Activity     Outcome Measure   Help from another person eating meals?: A Little Help from another person taking care of personal grooming?: A Little Help from another person toileting, which includes using toliet, bedpan, or urinal?: A Little Help from another person bathing (including washing, rinsing, drying)?: A Lot Help from another person to put on and taking off regular upper body clothing?: A Lot Help from another person to put on and taking off regular lower body clothing?: A Lot 6 Click Score: 15    End of Session Equipment Utilized During Treatment: Rolling walker;Gait belt;Cervical collar;Other (comment)(AE kit)  OT Visit Diagnosis: Unsteadiness on feet (R26.81);Other abnormalities of gait and mobility (R26.89);History of falling (Z91.81);Pain Pain - part of body: (cervical)   Activity Tolerance Patient tolerated treatment well   Patient Left in chair;with call bell/phone within reach;with chair alarm set   Nurse Communication Mobility status;Patient requests pain meds;Precautions        Time: 6333-5456 OT Time Calculation (min): 32 min  Charges: OT General Charges $OT Visit: 1 Visit OT Treatments $Self Care/Home Management  : 23-37 mins  Jesse Sans OTR/L Acute Rehabilitation Services Pager: [phone] Office: [place] 07/22/2019, 12:40 PM

## 2019-07-23 DIAGNOSIS — Z87891 Personal history of nicotine dependence: Secondary | ICD-10-CM | POA: Diagnosis not present

## 2019-07-23 DIAGNOSIS — Z792 Long term (current) use of antibiotics: Secondary | ICD-10-CM | POA: Diagnosis not present

## 2019-07-23 DIAGNOSIS — S12190D Other displaced fracture of second cervical vertebra, subsequent encounter for fracture with routine healing: Secondary | ICD-10-CM | POA: Diagnosis not present

## 2019-07-23 DIAGNOSIS — M810 Age-related osteoporosis without current pathological fracture: Secondary | ICD-10-CM | POA: Diagnosis not present

## 2019-07-23 DIAGNOSIS — B0089 Other herpesviral infection: Secondary | ICD-10-CM | POA: Diagnosis not present

## 2019-07-23 DIAGNOSIS — Z6841 Body Mass Index (BMI) 40.0 and over, adult: Secondary | ICD-10-CM | POA: Diagnosis not present

## 2019-07-23 DIAGNOSIS — M4802 Spinal stenosis, cervical region: Secondary | ICD-10-CM | POA: Diagnosis not present

## 2019-07-23 DIAGNOSIS — E890 Postprocedural hypothyroidism: Secondary | ICD-10-CM | POA: Diagnosis not present

## 2019-07-23 DIAGNOSIS — N952 Postmenopausal atrophic vaginitis: Secondary | ICD-10-CM | POA: Diagnosis not present

## 2019-07-23 DIAGNOSIS — Z8673 Personal history of transient ischemic attack (TIA), and cerebral infarction without residual deficits: Secondary | ICD-10-CM | POA: Diagnosis not present

## 2019-07-23 DIAGNOSIS — E669 Obesity, unspecified: Secondary | ICD-10-CM | POA: Diagnosis not present

## 2019-07-23 DIAGNOSIS — I10 Essential (primary) hypertension: Secondary | ICD-10-CM | POA: Diagnosis not present

## 2019-07-23 DIAGNOSIS — M8938 Hypertrophy of bone, other site: Secondary | ICD-10-CM | POA: Diagnosis not present

## 2019-07-23 DIAGNOSIS — M2578 Osteophyte, vertebrae: Secondary | ICD-10-CM | POA: Diagnosis not present

## 2019-07-23 DIAGNOSIS — E7800 Pure hypercholesterolemia, unspecified: Secondary | ICD-10-CM | POA: Diagnosis not present

## 2019-07-23 DIAGNOSIS — M47812 Spondylosis without myelopathy or radiculopathy, cervical region: Secondary | ICD-10-CM | POA: Diagnosis not present

## 2019-07-23 DIAGNOSIS — M4313 Spondylolisthesis, cervicothoracic region: Secondary | ICD-10-CM | POA: Diagnosis not present

## 2019-07-23 DIAGNOSIS — Z79899 Other long term (current) drug therapy: Secondary | ICD-10-CM | POA: Diagnosis not present

## 2019-07-23 DIAGNOSIS — M4050 Lordosis, unspecified, site unspecified: Secondary | ICD-10-CM | POA: Diagnosis not present

## 2019-07-23 DIAGNOSIS — M4312 Spondylolisthesis, cervical region: Secondary | ICD-10-CM | POA: Diagnosis not present

## 2019-07-23 DIAGNOSIS — N814 Uterovaginal prolapse, unspecified: Secondary | ICD-10-CM | POA: Diagnosis not present

## 2019-07-23 DIAGNOSIS — E785 Hyperlipidemia, unspecified: Secondary | ICD-10-CM | POA: Diagnosis not present

## 2019-07-23 DIAGNOSIS — Z79891 Long term (current) use of opiate analgesic: Secondary | ICD-10-CM | POA: Diagnosis not present

## 2019-07-23 DIAGNOSIS — Z96652 Presence of left artificial knee joint: Secondary | ICD-10-CM | POA: Diagnosis not present

## 2019-07-23 DIAGNOSIS — S12030D Displaced posterior arch fracture of first cervical vertebra, subsequent encounter for fracture with routine healing: Secondary | ICD-10-CM | POA: Diagnosis not present

## 2019-07-24 DIAGNOSIS — M4802 Spinal stenosis, cervical region: Secondary | ICD-10-CM | POA: Diagnosis not present

## 2019-07-24 DIAGNOSIS — S12030D Displaced posterior arch fracture of first cervical vertebra, subsequent encounter for fracture with routine healing: Secondary | ICD-10-CM | POA: Diagnosis not present

## 2019-07-24 DIAGNOSIS — M47812 Spondylosis without myelopathy or radiculopathy, cervical region: Secondary | ICD-10-CM | POA: Diagnosis not present

## 2019-07-24 DIAGNOSIS — M4313 Spondylolisthesis, cervicothoracic region: Secondary | ICD-10-CM | POA: Diagnosis not present

## 2019-07-24 DIAGNOSIS — M4312 Spondylolisthesis, cervical region: Secondary | ICD-10-CM | POA: Diagnosis not present

## 2019-07-24 DIAGNOSIS — S12190D Other displaced fracture of second cervical vertebra, subsequent encounter for fracture with routine healing: Secondary | ICD-10-CM | POA: Diagnosis not present

## 2019-07-27 DIAGNOSIS — S12030D Displaced posterior arch fracture of first cervical vertebra, subsequent encounter for fracture with routine healing: Secondary | ICD-10-CM | POA: Diagnosis not present

## 2019-07-27 DIAGNOSIS — M4802 Spinal stenosis, cervical region: Secondary | ICD-10-CM | POA: Diagnosis not present

## 2019-07-27 DIAGNOSIS — S12190D Other displaced fracture of second cervical vertebra, subsequent encounter for fracture with routine healing: Secondary | ICD-10-CM | POA: Diagnosis not present

## 2019-07-27 DIAGNOSIS — M47812 Spondylosis without myelopathy or radiculopathy, cervical region: Secondary | ICD-10-CM | POA: Diagnosis not present

## 2019-07-27 DIAGNOSIS — M4313 Spondylolisthesis, cervicothoracic region: Secondary | ICD-10-CM | POA: Diagnosis not present

## 2019-07-27 DIAGNOSIS — M4312 Spondylolisthesis, cervical region: Secondary | ICD-10-CM | POA: Diagnosis not present

## 2019-07-28 ENCOUNTER — Other Ambulatory Visit: Payer: Self-pay

## 2019-07-28 ENCOUNTER — Encounter (HOSPITAL_COMMUNITY): Payer: Self-pay | Admitting: Emergency Medicine

## 2019-07-28 ENCOUNTER — Emergency Department (HOSPITAL_COMMUNITY)
Admission: EM | Admit: 2019-07-28 | Discharge: 2019-07-29 | Disposition: A | Discharge status: Home / Self Care | Payer: Medicare Other | Attending: Emergency Medicine | Admitting: Emergency Medicine

## 2019-07-28 DIAGNOSIS — R0902 Hypoxemia: Secondary | ICD-10-CM | POA: Diagnosis not present

## 2019-07-28 DIAGNOSIS — S12100D Unspecified displaced fracture of second cervical vertebra, subsequent encounter for fracture with routine healing: Secondary | ICD-10-CM

## 2019-07-28 DIAGNOSIS — M542 Cervicalgia: Secondary | ICD-10-CM | POA: Diagnosis not present

## 2019-07-28 DIAGNOSIS — E039 Hypothyroidism, unspecified: Secondary | ICD-10-CM | POA: Insufficient documentation

## 2019-07-28 DIAGNOSIS — Z96652 Presence of left artificial knee joint: Secondary | ICD-10-CM | POA: Diagnosis not present

## 2019-07-28 DIAGNOSIS — Z87891 Personal history of nicotine dependence: Secondary | ICD-10-CM | POA: Insufficient documentation

## 2019-07-28 DIAGNOSIS — Z79899 Other long term (current) drug therapy: Secondary | ICD-10-CM | POA: Diagnosis not present

## 2019-07-28 DIAGNOSIS — Z20822 Contact with and (suspected) exposure to covid-19: Secondary | ICD-10-CM | POA: Insufficient documentation

## 2019-07-28 DIAGNOSIS — Y9389 Activity, other specified: Secondary | ICD-10-CM | POA: Insufficient documentation

## 2019-07-28 DIAGNOSIS — I10 Essential (primary) hypertension: Secondary | ICD-10-CM | POA: Diagnosis not present

## 2019-07-28 DIAGNOSIS — Y92018 Other place in single-family (private) house as the place of occurrence of the external cause: Secondary | ICD-10-CM | POA: Insufficient documentation

## 2019-07-28 DIAGNOSIS — Y998 Other external cause status: Secondary | ICD-10-CM | POA: Insufficient documentation

## 2019-07-28 DIAGNOSIS — R52 Pain, unspecified: Secondary | ICD-10-CM | POA: Diagnosis not present

## 2019-07-28 DIAGNOSIS — W19XXXA Unspecified fall, initial encounter: Secondary | ICD-10-CM | POA: Diagnosis not present

## 2019-07-28 DIAGNOSIS — S12000D Unspecified displaced fracture of first cervical vertebra, subsequent encounter for fracture with routine healing: Secondary | ICD-10-CM | POA: Diagnosis not present

## 2019-07-28 DIAGNOSIS — R251 Tremor, unspecified: Secondary | ICD-10-CM | POA: Insufficient documentation

## 2019-07-28 NOTE — ED Triage Notes (Signed)
Per EMS, pt from home w/ uncontrolled neck pain.  She had a fall and was discharged.  Pt expressed having difficulties w/ daily functioning.  Pt is in the same gown that she left the hospital in.

## 2019-07-29 ENCOUNTER — Emergency Department (HOSPITAL_COMMUNITY): Payer: Medicare Other

## 2019-07-29 DIAGNOSIS — R2689 Other abnormalities of gait and mobility: Secondary | ICD-10-CM | POA: Diagnosis not present

## 2019-07-29 DIAGNOSIS — Z7401 Bed confinement status: Secondary | ICD-10-CM | POA: Diagnosis not present

## 2019-07-29 DIAGNOSIS — S12000D Unspecified displaced fracture of first cervical vertebra, subsequent encounter for fracture with routine healing: Secondary | ICD-10-CM | POA: Diagnosis not present

## 2019-07-29 DIAGNOSIS — R269 Unspecified abnormalities of gait and mobility: Secondary | ICD-10-CM | POA: Diagnosis not present

## 2019-07-29 DIAGNOSIS — S12190D Other displaced fracture of second cervical vertebra, subsequent encounter for fracture with routine healing: Secondary | ICD-10-CM | POA: Diagnosis not present

## 2019-07-29 DIAGNOSIS — E039 Hypothyroidism, unspecified: Secondary | ICD-10-CM | POA: Diagnosis not present

## 2019-07-29 DIAGNOSIS — S129XXA Fracture of neck, unspecified, initial encounter: Secondary | ICD-10-CM | POA: Diagnosis not present

## 2019-07-29 DIAGNOSIS — S12090D Other displaced fracture of first cervical vertebra, subsequent encounter for fracture with routine healing: Secondary | ICD-10-CM | POA: Diagnosis not present

## 2019-07-29 DIAGNOSIS — M810 Age-related osteoporosis without current pathological fracture: Secondary | ICD-10-CM | POA: Diagnosis not present

## 2019-07-29 DIAGNOSIS — R251 Tremor, unspecified: Secondary | ICD-10-CM | POA: Diagnosis not present

## 2019-07-29 DIAGNOSIS — R296 Repeated falls: Secondary | ICD-10-CM | POA: Diagnosis not present

## 2019-07-29 DIAGNOSIS — M6281 Muscle weakness (generalized): Secondary | ICD-10-CM | POA: Diagnosis not present

## 2019-07-29 DIAGNOSIS — W19XXXA Unspecified fall, initial encounter: Secondary | ICD-10-CM | POA: Diagnosis not present

## 2019-07-29 DIAGNOSIS — R279 Unspecified lack of coordination: Secondary | ICD-10-CM | POA: Diagnosis not present

## 2019-07-29 DIAGNOSIS — R52 Pain, unspecified: Secondary | ICD-10-CM | POA: Diagnosis not present

## 2019-07-29 DIAGNOSIS — R2681 Unsteadiness on feet: Secondary | ICD-10-CM | POA: Diagnosis not present

## 2019-07-29 DIAGNOSIS — Z20822 Contact with and (suspected) exposure to covid-19: Secondary | ICD-10-CM | POA: Diagnosis not present

## 2019-07-29 DIAGNOSIS — Z20828 Contact with and (suspected) exposure to other viral communicable diseases: Secondary | ICD-10-CM | POA: Diagnosis not present

## 2019-07-29 DIAGNOSIS — S12100D Unspecified displaced fracture of second cervical vertebra, subsequent encounter for fracture with routine healing: Secondary | ICD-10-CM | POA: Diagnosis not present

## 2019-07-29 DIAGNOSIS — I10 Essential (primary) hypertension: Secondary | ICD-10-CM | POA: Diagnosis not present

## 2019-07-29 DIAGNOSIS — M2550 Pain in unspecified joint: Secondary | ICD-10-CM | POA: Diagnosis not present

## 2019-07-29 DIAGNOSIS — A6009 Herpesviral infection of other urogenital tract: Secondary | ICD-10-CM | POA: Diagnosis not present

## 2019-07-29 LAB — CBC WITH DIFFERENTIAL/PLATELET
Abs Immature Granulocytes: 0.04 10*3/uL (ref 0.00–0.07)
Basophils Absolute: 0.1 10*3/uL (ref 0.0–0.1)
Basophils Relative: 1 %
Eosinophils Absolute: 0.3 10*3/uL (ref 0.0–0.5)
Eosinophils Relative: 4 %
HCT: 38.6 % (ref 36.0–46.0)
Hemoglobin: 13 g/dL (ref 12.0–15.0)
Immature Granulocytes: 1 %
Lymphocytes Relative: 21 %
Lymphs Abs: 1.9 10*3/uL (ref 0.7–4.0)
MCH: 33.2 pg (ref 26.0–34.0)
MCHC: 33.7 g/dL (ref 30.0–36.0)
MCV: 98.7 fL (ref 80.0–100.0)
Monocytes Absolute: 0.8 10*3/uL (ref 0.1–1.0)
Monocytes Relative: 9 %
Neutro Abs: 5.7 10*3/uL (ref 1.7–7.7)
Neutrophils Relative %: 64 %
Platelets: 274 10*3/uL (ref 150–400)
RBC: 3.91 MIL/uL (ref 3.87–5.11)
RDW: 12.9 % (ref 11.5–15.5)
WBC: 8.8 10*3/uL (ref 4.0–10.5)
nRBC: 0 % (ref 0.0–0.2)

## 2019-07-29 LAB — URINALYSIS, ROUTINE W REFLEX MICROSCOPIC
Bilirubin Urine: NEGATIVE
Glucose, UA: NEGATIVE mg/dL
Hgb urine dipstick: NEGATIVE
Ketones, ur: 20 mg/dL — AB
Nitrite: NEGATIVE
Protein, ur: NEGATIVE mg/dL
Specific Gravity, Urine: 1.019 (ref 1.005–1.030)
pH: 5 (ref 5.0–8.0)

## 2019-07-29 LAB — BASIC METABOLIC PANEL
Anion gap: 13 (ref 5–15)
BUN: 37 mg/dL — ABNORMAL HIGH (ref 8–23)
CO2: 19 mmol/L — ABNORMAL LOW (ref 22–32)
Calcium: 11.2 mg/dL — ABNORMAL HIGH (ref 8.9–10.3)
Chloride: 108 mmol/L (ref 98–111)
Creatinine, Ser: 1.32 mg/dL — ABNORMAL HIGH (ref 0.44–1.00)
GFR calc Af Amer: 43 mL/min — ABNORMAL LOW (ref >60)
GFR calc non Af Amer: 37 mL/min — ABNORMAL LOW (ref >60)
Glucose, Bld: 103 mg/dL — ABNORMAL HIGH (ref 70–99)
Potassium: 4.6 mmol/L (ref 3.5–5.1)
Sodium: 140 mmol/L (ref 135–145)

## 2019-07-29 LAB — RESPIRATORY PANEL BY RT PCR (FLU A&B, COVID)
Influenza A by PCR: NEGATIVE
Influenza B by PCR: NEGATIVE
SARS Coronavirus 2 by RT PCR: NEGATIVE

## 2019-07-29 MED ORDER — LEVOTHYROXINE SODIUM 100 MCG PO TABS
100.0000 ug | ORAL_TABLET | Freq: Every day | ORAL | Status: DC
  Administered 2019-07-29: 100 ug via ORAL
  Filled 2019-07-29: qty 1

## 2019-07-29 MED ORDER — HYDROCHLOROTHIAZIDE 25 MG PO TABS
25.0000 mg | ORAL_TABLET | Freq: Every day | ORAL | Status: DC
  Administered 2019-07-29: 10:00:00 25 mg via ORAL
  Filled 2019-07-29: qty 1

## 2019-07-29 MED ORDER — ADULT MULTIVITAMIN W/MINERALS CH
1.0000 | ORAL_TABLET | Freq: Every day | ORAL | Status: DC
  Administered 2019-07-29: 1 via ORAL
  Filled 2019-07-29: qty 1

## 2019-07-29 MED ORDER — TRAMADOL HCL 50 MG PO TABS
50.0000 mg | ORAL_TABLET | Freq: Four times a day (QID) | ORAL | Status: DC | PRN
  Administered 2019-07-29 (×2): 50 mg via ORAL
  Filled 2019-07-29 (×2): qty 1

## 2019-07-29 MED ORDER — TRAMADOL HCL 50 MG PO TABS: ORAL_TABLET | ORAL | 0 refills | Status: DC

## 2019-07-29 MED ORDER — AMLODIPINE BESYLATE 5 MG PO TABS
5.0000 mg | ORAL_TABLET | Freq: Every day | ORAL | Status: DC
  Administered 2019-07-29: 10:00:00 5 mg via ORAL
  Filled 2019-07-29: qty 1

## 2019-07-29 MED ORDER — LISINOPRIL 10 MG PO TABS
10.0000 mg | ORAL_TABLET | Freq: Every day | ORAL | Status: DC
  Administered 2019-07-29: 10 mg via ORAL
  Filled 2019-07-29: qty 1

## 2019-07-29 MED ORDER — SIMVASTATIN 20 MG PO TABS: 40.0000 mg | ORAL_TABLET | Freq: Every day | ORAL | Status: DC

## 2019-07-29 MED ORDER — APAP 325 MG PO TABS: 650.0000 mg | ORAL_TABLET | Freq: Four times a day (QID) | ORAL | 0 refills | Status: DC | PRN

## 2019-07-29 NOTE — Discharge Planning (Signed)
[facility] consulted regarding SNF placement vs home with home health.

## 2019-07-29 NOTE — ED Notes (Signed)
Lunch Tray Ordered @ 1049. °

## 2019-07-29 NOTE — ED Notes (Signed)
MD made aware of current inability to collect labs despite 2 RN attempts

## 2019-07-29 NOTE — ED Provider Notes (Signed)
Patient with recent discharge following C1 and C2 fracture.  Return due to pain and inability to take care of her self at home.  Patient medically cleared.  Awaiting evaluation by transitions of care team for possible placement versus home health.  Home medications have been ordered.   Lennice Sites, DO 07/29/19 [phone]

## 2019-07-29 NOTE — ED Notes (Signed)
PT in room to eval PT.

## 2019-07-29 NOTE — NC FL2 (Signed)
[place] LEVEL OF CARE SCREENING TOOL     IDENTIFICATION  Patient Name: Kimberly Webb Birthdate: September 07, 1932 Sex: female Admission Date (Current Location): 07/28/2019  [place] and [place] Number:  [place] and Address:  [facility] [place] [place] [address] [place], [place] [address]      Provider Number: [id number]  Attending Physician Name and Address:  Default, Provider, MD  Relative Name and Phone Number:  Santita Rippeon, [phone]    Current Level of Care: Hospital Recommended Level of Care: [place] Prior Approval Number:    Date Approved/Denied:   PASRR Number: [id number]  Discharge Plan: SNF    Current Diagnoses: Patient Active Problem List   Diagnosis Date Noted  . C1 cervical fracture ([place]) 07/21/2019  . Neck fracture ([place]) 07/21/2019  . Vaginal discharge 09/07/2016  . Menopause 09/06/2016  . History of herpes genitalis 04/04/2016  . Skin mole 04/01/2013  . STD (sexually transmitted disease)   . Rectocele   . Atrophic vaginitis   . Osteoporosis   . Hypercholesteremia   . Arthritis   . Hypothyroidism     Orientation RESPIRATION BLADDER Height & Weight     Self, Time, Situation, Place  Normal Incontinent Weight: 179 lb 4.8 oz (81.3 kg) Height:  5\' 2"  (157.5 cm)  BEHAVIORAL SYMPTOMS/MOOD NEUROLOGICAL BOWEL NUTRITION STATUS      Incontinent Diet(Normal)  AMBULATORY STATUS COMMUNICATION OF NEEDS Skin   Limited Assist Verbally Normal                       Personal Care Assistance Level of Assistance  Bathing, Dressing, Feeding Bathing Assistance: Limited assistance Feeding assistance: Independent Dressing Assistance: Maximum assistance     Functional Limitations Info  Sight, Hearing, Speech Sight Info: Adequate Hearing Info: Adequate Speech Info: Adequate    SPECIAL CARE FACTORS FREQUENCY  PT (By licensed PT), OT (By licensed OT)     PT Frequency: 5x weekly OT  Frequency: 5x weekly            Contractures      Additional Factors Info                  Current Medications (07/29/2019):  This is the current hospital active medication list Current Facility-Administered Medications  Medication Dose Route Frequency Provider Last Rate Last Admin  . amLODipine (NORVASC) tablet 5 mg  5 mg Oral Daily Delora Fuel, MD      . hydrochlorothiazide (HYDRODIURIL) tablet 25 mg  25 mg Oral Daily Delora Fuel, MD      . levothyroxine (SYNTHROID) tablet 100 mcg  100 mcg Oral QAC breakfast Delora Fuel, MD   123XX123 mcg at 07/29/19 0753  . lisinopril (ZESTRIL) tablet 10 mg  10 mg Oral Daily Delora Fuel, MD      . multivitamin with minerals tablet 1 tablet  1 tablet Oral Daily Delora Fuel, MD      . simvastatin (ZOCOR) tablet 40 mg  40 mg Oral QHS Delora Fuel, MD      . traMADol Veatrice Bourbon) tablet 50 mg  50 mg Oral [id number] [id number] Delora Fuel, MD   50 mg at 07/29/19 [id number]   Current Outpatient Medications  Medication Sig Dispense Refill  . acyclovir (ZOVIRAX) 400 MG tablet Take 1 tablet (400 mg total) by mouth daily. 90 tablet 3  . amLODipine (NORVASC) 5 MG tablet Take 5 mg by mouth daily.      [name] Kitchen denosumab (PROLIA)  60 MG/ML SOLN injection Inject 60 mg into the skin every 6 (six) months. Administer in upper arm, thigh, or abdomen    . hydrochlorothiazide (HYDRODIURIL) 25 MG tablet Take 25 mg by mouth daily.    [name] Kitchen levothyroxine (SYNTHROID, LEVOTHROID) 100 MCG tablet Take 100 mcg by mouth daily before breakfast.     . lisinopril (ZESTRIL) 10 MG tablet Take 10 mg by mouth daily.    . Multiple Vitamin (MULTIVITAMIN) capsule Take 1 capsule by mouth daily.      . simvastatin (ZOCOR) 40 MG tablet Take 40 mg by mouth at bedtime.      . traMADol (ULTRAM) 50 MG tablet Take 1 tablet (50 mg total) by mouth every 6 (six) hours as needed for moderate pain. 10 tablet 0     Discharge Medications: Please see discharge summary for a list of discharge medications.  Relevant Imaging  Results:  Relevant Lab Results:   Additional Information SSN: [id number]  Archie Endo, LCSW

## 2019-07-29 NOTE — ED Notes (Signed)
Transition of Care Consult  Breakfast ordered

## 2019-07-29 NOTE — ED Provider Notes (Signed)
[facility] EMERGENCY DEPARTMENT Provider Note   CSN: [id number] Arrival date & time: 07/28/19  1916   History Chief Complaint  Patient presents with  . Neck Pain    Kimberly Webb is a 86 y.o. female.  The history is provided by the patient.  Neck Pain She has history of hypertension, hyperlipidemia and was discharged from the hospital on February 17 following fall with C-spine fracture.  At home, she has been having difficulty taking care of herself.  Today, she fell because her legs gave out on her.  She does not think she reinjured her neck.  She denies weakness, numbness.  She denies bowel or bladder dysfunction.  She also is complaining of a tremor in her left hand today.  Past Medical History:  Diagnosis Date  . Arthritis   . Atrophic vaginitis   . Cystocele   . Fibroid   . Hypercholesteremia   . Hypertension   . Hypothyroidism   . Osteoporosis   . Paratubal cyst   . Rectocele   . STD (sexually transmitted disease)    HSV  . Uterine prolapse     Patient Active Problem List   Diagnosis Date Noted  . C1 cervical fracture ([place]) 07/21/2019  . Neck fracture ([place]) 07/21/2019  . Vaginal discharge 09/07/2016  . Menopause 09/06/2016  . History of herpes genitalis 04/04/2016  . Skin mole 04/01/2013  . STD (sexually transmitted disease)   . Rectocele   . Atrophic vaginitis   . Osteoporosis   . Hypercholesteremia   . Arthritis   . Hypothyroidism     Past Surgical History:  Procedure Laterality Date  . BROKEN ANKLE    . BROKEN FEMUR    . CATARACT EXTRACTION Bilateral   . FOOT SURGERY Right    TOE  . HERNIA REPAIR  2009  . OOPHORECTOMY     BSO WITH VAG HYST IN 2004  . REPLACEMENT TOTAL KNEE  2011   LEFT X 2  . THYROIDECTOMY, PARTIAL    . VAGINAL HYSTERECTOMY  2004   VAG HYST, BSO, A&P REPAIR     OB History    Gravida  2   Para  2   Term  2   Preterm      AB      Living  2     SAB      TAB      Ectopic      Multiple        Live Births              Family History  Problem Relation Age of Onset  . Heart disease Mother   . Hypertension Father   . Hypertension Sister   . Leukemia Sister   . Heart disease Brother   . Multiple sclerosis Brother   . Parkinsonism Brother     Social History   Tobacco Use  . Smoking status: Former Research scientist (life sciences)  . Smokeless tobacco: Never Used  Substance Use Topics  . Alcohol use: No  . Drug use: No    Home Medications Prior to Admission medications   Medication Sig Start Date End Date Taking? Authorizing Provider  acyclovir (ZOVIRAX) 400 MG tablet Take 1 tablet (400 mg total) by mouth daily. 06/29/19   Joseph Pierini, MD  amLODipine (NORVASC) 5 MG tablet Take 5 mg by mouth daily.      [provider]  amoxicillin (AMOXIL) 500 MG capsule Take 500 mg by mouth 4 (four) times  daily as needed. Take with dental procedures    [provider]  denosumab (PROLIA) 60 MG/ML SOLN injection Inject 60 mg into the skin every 6 (six) months. Administer in upper arm, thigh, or abdomen    [provider]  hydrochlorothiazide (HYDRODIURIL) 25 MG tablet Take 25 mg by mouth daily. 08/04/13   [provider]  levothyroxine (SYNTHROID, LEVOTHROID) 100 MCG tablet Take 100 mcg by mouth daily before breakfast.     [provider]  lisinopril (ZESTRIL) 10 MG tablet Take 10 mg by mouth daily. 05/08/19   [provider]  Multiple Vitamin (MULTIVITAMIN) capsule Take 1 capsule by mouth daily.      [provider]  simvastatin (ZOCOR) 40 MG tablet Take 40 mg by mouth at bedtime.      [provider]  traMADol (ULTRAM) 50 MG tablet Take 1 tablet (50 mg total) by mouth every 6 (six) hours as needed for moderate pain. 07/22/19   Geradine Girt, DO    Allergies    Patient has no known allergies.  Review of Systems   Review of Systems  Musculoskeletal: Positive for neck pain.  All other systems reviewed and are  negative.   Physical Exam Updated Vital Signs BP (!) 145/91 (BP Location: Right Arm)   Pulse 73   Temp 98.5 F (36.9 C) (Oral)   Resp 16   SpO2 99%   Physical Exam Vitals and nursing note reviewed.   86 year old female, resting comfortably and in no acute distress. Vital signs are significant for mildly elevated blood pressure. Oxygen saturation is 99%, which is normal. Head is normocephalic and atraumatic. PERRLA, EOMI. Oropharynx is clear. Neck is immobilized in a stiff cervical collar. Back is nontender and there is no CVA tenderness. Lungs are clear without rales, wheezes, or rhonchi. Chest is nontender. Heart has regular rate and rhythm without murmur. Abdomen is soft, flat, nontender without masses or hepatosplenomegaly and peristalsis is normoactive. Extremities have 1+ edema, full range of motion is present. Skin is warm and dry without rash. Neurologic: Mental status is normal, cranial nerves are intact, there are no motor or sensory deficits.  Strength is 5/5 in both legs.  ED Results / Procedures / Treatments   Labs (all labs ordered are listed, but only abnormal results are displayed) Labs Reviewed  BASIC METABOLIC PANEL - Abnormal; Notable for the following components:      Result Value   CO2 19 (*)    Glucose, Bld 103 (*)    BUN 37 (*)    Creatinine, Ser 1.32 (*)    Calcium 11.2 (*)    GFR calc non Af Amer 37 (*)    GFR calc Af Amer 43 (*)    All other components within normal limits  RESPIRATORY PANEL BY RT PCR (FLU A&B, COVID)  CBC WITH DIFFERENTIAL/PLATELET  URINALYSIS, ROUTINE W REFLEX MICROSCOPIC   Radiology CT Cervical Spine Wo Contrast  Result Date: 07/29/2019 CLINICAL DATA:  Known C-spine fracture, fall tonight EXAM: CT CERVICAL SPINE WITHOUT CONTRAST TECHNIQUE: Multidetector CT imaging of the cervical spine was performed without intravenous contrast. Multiplanar CT image reconstructions were also generated. COMPARISON:  CT and MRI 07/20/2019  FINDINGS: Alignment: Straightening of the normal cervical lordosis, similar to prior with stepwise anterolisthesis C3-C5. Mild widening of the left C2-3 facet is similar to prior. No other abnormally widened, jumped or perched facets. Craniocervical and atlantoaxial articulations are and unchanged alignment. Skull base and vertebrae: Redemonstration of the displaced  posterior arch fracture C1 (4/26) without significant interval widening or displacement from comparison CT 07/20/2019. Similarly a displaced fracture involving the base of the dens extending into the bilateral lateral masses and bilateral transverse foramina of C2 (4/37, type III dens fracture). No significant displacement of the comminuted fracture fragments when compared to prior CT imaging. No new cervical spine fracture is evident. No other bone lesion or focal pathologic process is seen. Soft tissues and spinal canal: No new paravertebral swelling or fluid. No visible canal hematoma. Mild calcification posterior to the dens is unchanged from prior Disc levels: Multilevel intervertebral disc height loss with spondylitic endplate changes. Multilevel mild canal narrowing secondary to posterior disc osteophyte complexes are present. Additional uncinate spurring and facet hypertrophic changes result in mild-to-moderate multilevel neural foraminal narrowing as well. Upper chest: No acute abnormality in the upper chest or imaged lung apices. Other: Large left thyroid nodule, incompletely included within the level of imaging measuring up to 4.7 by 3.9 cm size (5/75). IMPRESSION: 1. Redemonstration of displaced fractures involving the posterior arch of C1 (AOSpine IIA), and the base of the dens extending into the bilateral lateral masses and bilateral transverse foramina of C2 with associated ligamentous injury demonstrated on MRI (AOSpine IIIB). No significant interval displacement or translation of the fracture fragments when compared to prior CT imaging.  However, if new neurologic symptoms developed, low threshold for MR imaging should be considered. 2. No new cervical spine fracture is evident. 3. Multilevel degenerative changes are similar to prior. 4. Large left thyroid nodule, incompletely included within the level of imaging. If not previously performed consider further evaluation with thyroid ultrasound. This follows consensus guidelines: Managing Incidental Thyroid Nodules Detected on Imaging: White Paper of the ACR Incidental Thyroid Findings Committee. J Am Coll Radiol 2015; 12:143-150. and [facility] 3-tiered system for managing ITNs: J Am Coll Radiol. 2015; Feb;12(2): 143-50 Electronically Signed   By: Lovena Le M.D.   On: 07/29/2019 02:41    Procedures Procedures  Medications Ordered in ED Medications  amLODipine (NORVASC) tablet 5 mg (has no administration in time range)  hydrochlorothiazide (HYDRODIURIL) tablet 25 mg (has no administration in time range)  levothyroxine (SYNTHROID) tablet 100 mcg (has no administration in time range)  lisinopril (ZESTRIL) tablet 10 mg (has no administration in time range)  multivitamin with minerals tablet 1 tablet (has no administration in time range)  simvastatin (ZOCOR) tablet 40 mg (has no administration in time range)  traMADol (ULTRAM) tablet 50 mg (50 mg Oral Given 07/29/19 0531)    ED Course  I have reviewed the triage vital signs and the nursing notes.  Pertinent labs & imaging results that were available during my care of the patient were reviewed by me and considered in my medical decision making (see chart for details).  MDM Rules/Calculators/A&P Fall at home.  Recent cervical spine fracture.  Old records were reviewed confirming patient had been admitted with C1 and C2 fractures being treated conservatively.  No evidence of acute neurologic injury, but will send for CT of C-spine.  She will need to be evaluated for possible placement at skilled nursing home.  No evidence of tremor on  exam.  CT scans showed no new injuries, no displacement of fracture fragments.  Labs show slight increase in calcium compared with the recent.  Consultation is requested from transition of care team.  Final Clinical Impression(s) / ED Diagnoses Final diagnoses:  Traumatic closed fracture of C2 vertebra with minimal displacement, with routine healing, subsequent encounter  Traumatic closed fracture of C1 vertebra with minimal displacement, with routine healing, subsequent encounter  Fall in home, initial encounter  Hypercalcemia    Rx / DC Orders ED Discharge Orders    None       Delora Fuel, MD [id number] [phone]

## 2019-07-29 NOTE — Progress Notes (Addendum)
1:45pm: Patient will go to room 3221 at [facility]. Patient will be transported via [facility]. The number for report is [phone]. RN will arrange for pickup via [facility] once ready.  11:30am: CSW met with patient at bedside to present her with bed offers. Patient chose [facility] for SNF placement. Patient tested negative for COVID earlier today.  CSW notified Janie in admissions at [place] of patient's decision. Narda Rutherford reports a room will be available for the patient after 2pm.  CSW updated patient's husband Elberta Fortis on the discharge plan he was agreeable. CSW provided Elberta Fortis with address and phone number for facility.  CSW faxed patient's AVS and negative COVID result to facility.  10am: CSW received consult for patient for SNF placement. CSW spoke with patient's RN Hassan Rowan who states the patient is agreeable to SNF placement in [facility].  CSW completed FL2 and faxed patient's information out to facilities for review.  Madilyn Fireman, MSW, LCSW-A Transitions of Care  Clinical Social Worker  [facility] Emergency Departments  Medical ICU [phone]

## 2019-07-29 NOTE — ED Notes (Signed)
Attempted report 

## 2019-07-29 NOTE — Discharge Instructions (Signed)
1.  Wear your cervical spine collar at all times until told to do otherwise by your neurosurgeon. 2.  You may take Tylenol every 4-6 hours for pain.  You may also take 1 to 2 tablets of tramadol with the Tylenol for additional pain control.

## 2019-07-29 NOTE — Evaluation (Signed)
Physical Therapy Evaluation Patient Details Name: Kimberly Webb MRN: [id number] DOB: [birth year] Today's Date: 07/29/2019   History of Present Illness  86yo female s/p fall on 2/15 which resulted in B posterior lamina fractures, C2 body fracture, C2 B pars fractures, C2-C3 ligamentous injury, and R superior and suboccipital paraspinal injury. Given aspen collar and went home with HHPT, 24/7 assist from family. Now returns with a fall at home, no new injury or worsening of injuries per CT. PMH HTN, HLD, L TKR x2, hx ankle and femur fracture  Clinical Impression   Patient received in bed, pleasant and willing to work with therapy. See below for mobility/levels of assist. Had some difficulty with bed mobility while maintaining cervical precautions, also mild dizziness sitting at EOB which resolved with seated rest. Able to recreate distances she has been walking at home, slow but steady with RW but reports that she has actually been using rollator at home which would certainly give less external support and possibly lead to more falls at home. Grossly weak and seems easily fatigued. She was left in bed with all needs met this morning, RN aware of need for new purewick. Currently recommend SNF prior to return home given recent history of multiple falls with significant injury.     Follow Up Recommendations SNF;Supervision for mobility/OOB    Equipment Recommendations  Rolling walker with 5" wheels;3in1 (PT)    Recommendations for Other Services       Precautions / Restrictions Precautions Precautions: Cervical Precaution Booklet Issued: Yes (comment) Precaution Comments: reviewed precautions Required Braces or Orthoses: Cervical Brace Cervical Brace: Hard collar Restrictions Weight Bearing Restrictions: No Other Position/Activity Restrictions: cervical biomechanical precautions      Mobility  Bed Mobility Overal bed mobility: Needs Assistance Bed Mobility: Rolling;Sidelying to Sit;Sit  to Sidelying Rolling: Mod assist Sidelying to sit: Mod assist     Sit to sidelying: Mod assist General bed mobility comments: for good cervical mechanics and to maintain precautions, also for BLE managemnet and to boost trunk to sitting  Transfers Overall transfer level: Needs assistance Equipment used: Rolling walker (2 wheeled) Transfers: Sit to/from Stand Sit to Stand: Min guard;From elevated surface         General transfer comment: min guard and cues for correct hand placement, increased time and effort  Ambulation/Gait Ambulation/Gait assistance: Min guard Gait Distance (Feet): 25 Feet Assistive device: Rolling walker (2 wheeled) Gait Pattern/deviations: Step-through pattern;Decreased stride length Gait velocity: decreased   General Gait Details: slow but steady with RW, did need cues for safety and had one standing rest break due to cervical pain  Stairs            Wheelchair Mobility    Modified Rankin (Stroke Patients Only)       Balance Overall balance assessment: Needs assistance;History of Falls Sitting-balance support: Bilateral upper extremity supported Sitting balance-Leahy Scale: Fair Sitting balance - Comments: EOB without LOB   Standing balance support: Bilateral upper extremity supported Standing balance-Leahy Scale: Poor Standing balance comment: dependent on BUE on RW, hx of falls with significant injury                             Pertinent Vitals/Pain Pain Assessment: Faces Faces Pain Scale: Hurts a little bit Pain Location: neck, headache Pain Descriptors / Indicators: Headache;Discomfort;Grimacing;Sore Pain Intervention(s): Limited activity within patient's tolerance;Monitored during session;Repositioned    Home Living Family/patient expects to be discharged to:: Private residence Living Arrangements:  Spouse/significant other;Other relatives(grandson) Available Help at Discharge: Family;Available 24 hours/day Type of  Home: House Home Access: Stairs to enter Entrance Stairs-Rails: None Entrance Stairs-Number of Steps: 1 Home Layout: One level Home Equipment: Walker - 4 wheels;Shower seat;Grab bars - tub/shower;Grab bars - toilet;Wheelchair - manual;Cane - single point;Bedside commode      Prior Function Level of Independence: Independent with assistive device(s)         Comments: used a rollator at baseline, drives still, does cooking and cleaning and grocery shopping     Hand Dominance   Dominant Hand: Right    Extremity/Trunk Assessment   Upper Extremity Assessment Upper Extremity Assessment: Generalized weakness    Lower Extremity Assessment Lower Extremity Assessment: Generalized weakness    Cervical / Trunk Assessment Cervical / Trunk Assessment: Other exceptions Cervical / Trunk Exceptions: in apsen collar throughout, conservative fx management  Communication   Communication: No difficulties  Cognition Arousal/Alertness: Awake/alert Behavior During Therapy: WFL for tasks assessed/performed Overall Cognitive Status: Within Functional Limits for tasks assessed                                        General Comments      Exercises     Assessment/Plan    PT Assessment Patient needs continued PT services  PT Problem List Decreased activity tolerance;Decreased balance;Decreased mobility;Decreased cognition;Decreased knowledge of precautions;Pain       PT Treatment Interventions DME instruction;Gait training;Stair training;Functional mobility training;Therapeutic activities;Therapeutic exercise;Balance training;Patient/family education    PT Goals (Current goals can be found in the Care Plan section)  Acute Rehab PT Goals Patient Stated Goal: go to rehab and prevent more falls PT Goal Formulation: With patient Time For Goal Achievement: 08/12/19 Potential to Achieve Goals: Fair    Frequency Min 2X/week   Barriers to discharge         Co-evaluation               AM-PAC PT "6 Clicks" Mobility  Outcome Measure Help needed turning from your back to your side while in a flat bed without using bedrails?: A Lot Help needed moving from lying on your back to sitting on the side of a flat bed without using bedrails?: A Lot Help needed moving to and from a bed to a chair (including a wheelchair)?: A Little Help needed standing up from a chair using your arms (e.g., wheelchair or bedside chair)?: A Little Help needed to walk in hospital room?: A Little Help needed climbing 3-5 steps with a railing? : A Little 6 Click Score: 16    End of Session Equipment Utilized During Treatment: Gait belt;Cervical collar Activity Tolerance: Patient tolerated treatment well Patient left: in bed;with call bell/phone within reach   PT Visit Diagnosis: Unsteadiness on feet (R26.81);Difficulty in walking, not elsewhere classified (R26.2);History of falling (Z91.81)    Time: 6440-3474 PT Time Calculation (min) (ACUTE ONLY): 23 min   Charges:   PT Evaluation $PT Eval Moderate Complexity: 1 Mod PT Treatments $Gait Training: 8-22 mins       Windell Norfolk, DPT, PN1   Supplemental Physical Therapist [place]    Pager [phone] Acute Rehab Office [phone]

## 2019-07-29 NOTE — ED Notes (Signed)
PT changed with fresh brief applied. Pt comfortable and moved to [facility]. Report has been given to facility and paperwork sent.

## 2019-07-30 DIAGNOSIS — S129XXA Fracture of neck, unspecified, initial encounter: Secondary | ICD-10-CM | POA: Diagnosis not present

## 2019-07-30 DIAGNOSIS — E039 Hypothyroidism, unspecified: Secondary | ICD-10-CM | POA: Diagnosis not present

## 2019-07-30 DIAGNOSIS — R296 Repeated falls: Secondary | ICD-10-CM | POA: Diagnosis not present

## 2019-07-30 DIAGNOSIS — S12090D Other displaced fracture of first cervical vertebra, subsequent encounter for fracture with routine healing: Secondary | ICD-10-CM | POA: Diagnosis not present

## 2019-07-30 DIAGNOSIS — R269 Unspecified abnormalities of gait and mobility: Secondary | ICD-10-CM | POA: Diagnosis not present

## 2019-07-30 DIAGNOSIS — S12190D Other displaced fracture of second cervical vertebra, subsequent encounter for fracture with routine healing: Secondary | ICD-10-CM | POA: Diagnosis not present

## 2019-07-30 DIAGNOSIS — I10 Essential (primary) hypertension: Secondary | ICD-10-CM | POA: Diagnosis not present

## 2019-08-06 ENCOUNTER — Other Ambulatory Visit: Payer: Self-pay | Admitting: *Deleted

## 2019-08-06 NOTE — Patient Outreach (Signed)
Member screened for potential [facility] Care Management needs as a benefit of [place] [employer].  Mrs. Amalfitano is currently receiving skilled therapy at [facility].  Writer will continue to follow for potential [facility] needs and transition plans while member resides in SNF.    Marthenia Rolling, MSN-Ed, RN,BSN [place] Acute Care Coordinator [phone] [facility]) [phone]  (Toll free office)

## 2019-08-12 ENCOUNTER — Other Ambulatory Visit: Payer: Self-pay | Admitting: *Deleted

## 2019-08-12 NOTE — Patient Outreach (Signed)
Screened for potential [facility] Care Management needs as a benefit of  [employer].  Kimberly Webb is currently receiving skilled therapy at [facility].  Writer attended telephonic interdisciplinary team meeting to assess for disposition needs and transition plan for resident.   Facility reports member will transition to home tomorrow 08/13/19. She will have [facility].   After IDT meeting, telephone call made to [facility] home [phone]. Spoke with Mr. Womelsdorf (husband) who provided writer with member's cell number [phone] to call regarding [place] Management services.   Attempted to reach Mrs. Kettles via cell [phone]. No answer. Voicemail box full. Unable to leave message.   Will attempt to reach at later time to discuss potential [place] Management services.   Marthenia Rolling, MSN-Ed, RN,BSN [place] Acute Care Coordinator [phone] [facility]) [phone]  (Toll free office)

## 2019-08-14 ENCOUNTER — Other Ambulatory Visit: Payer: Self-pay | Admitting: *Deleted

## 2019-08-14 NOTE — Patient Outreach (Signed)
[facility] Post- Acute Care Coordinator follow up  Mrs. Christian discharged from [facility] SNF on yesterday 08/13/19. Telephone call made to Mrs. Rohlfs at [phone]. Patient identifiers confirmed.   Explained [place] Management services. Mrs. Erman denies having any [facility] Care Management needs. Denies having concerns with medication, transportation, meals, or chronic illnesses. States she will have [facility]. Lives with husband. Has PCP appointment scheduled for March 24th.  Mrs. Grom asked that writer communicate with facility about her clothes. States clothes were being washed before she left [facility] yesterday. Writer provided member with [facility] telephone number to contact and Public librarian to facility Social Worker to ask that facility follow up with member regarding her clothes.  Will sign off as member denies having any [facility] needs at this time.   Marthenia Rolling, MSN-Ed, RN,BSN [place] Acute Care Coordinator [phone] [facility]) [phone]  (Toll free office)

## 2019-08-15 DIAGNOSIS — M4313 Spondylolisthesis, cervicothoracic region: Secondary | ICD-10-CM | POA: Diagnosis not present

## 2019-08-15 DIAGNOSIS — S12190D Other displaced fracture of second cervical vertebra, subsequent encounter for fracture with routine healing: Secondary | ICD-10-CM | POA: Diagnosis not present

## 2019-08-15 DIAGNOSIS — M4312 Spondylolisthesis, cervical region: Secondary | ICD-10-CM | POA: Diagnosis not present

## 2019-08-15 DIAGNOSIS — M4802 Spinal stenosis, cervical region: Secondary | ICD-10-CM | POA: Diagnosis not present

## 2019-08-15 DIAGNOSIS — S12030D Displaced posterior arch fracture of first cervical vertebra, subsequent encounter for fracture with routine healing: Secondary | ICD-10-CM | POA: Diagnosis not present

## 2019-08-15 DIAGNOSIS — M47812 Spondylosis without myelopathy or radiculopathy, cervical region: Secondary | ICD-10-CM | POA: Diagnosis not present

## 2019-08-17 DIAGNOSIS — M4313 Spondylolisthesis, cervicothoracic region: Secondary | ICD-10-CM | POA: Diagnosis not present

## 2019-08-17 DIAGNOSIS — S12190D Other displaced fracture of second cervical vertebra, subsequent encounter for fracture with routine healing: Secondary | ICD-10-CM | POA: Diagnosis not present

## 2019-08-17 DIAGNOSIS — M47812 Spondylosis without myelopathy or radiculopathy, cervical region: Secondary | ICD-10-CM | POA: Diagnosis not present

## 2019-08-17 DIAGNOSIS — M4312 Spondylolisthesis, cervical region: Secondary | ICD-10-CM | POA: Diagnosis not present

## 2019-08-17 DIAGNOSIS — S12030D Displaced posterior arch fracture of first cervical vertebra, subsequent encounter for fracture with routine healing: Secondary | ICD-10-CM | POA: Diagnosis not present

## 2019-08-17 DIAGNOSIS — M4802 Spinal stenosis, cervical region: Secondary | ICD-10-CM | POA: Diagnosis not present

## 2019-08-19 DIAGNOSIS — S12190D Other displaced fracture of second cervical vertebra, subsequent encounter for fracture with routine healing: Secondary | ICD-10-CM | POA: Diagnosis not present

## 2019-08-19 DIAGNOSIS — D1801 Hemangioma of skin and subcutaneous tissue: Secondary | ICD-10-CM | POA: Diagnosis not present

## 2019-08-19 DIAGNOSIS — L821 Other seborrheic keratosis: Secondary | ICD-10-CM | POA: Diagnosis not present

## 2019-08-19 DIAGNOSIS — M4313 Spondylolisthesis, cervicothoracic region: Secondary | ICD-10-CM | POA: Diagnosis not present

## 2019-08-19 DIAGNOSIS — Z85828 Personal history of other malignant neoplasm of skin: Secondary | ICD-10-CM | POA: Diagnosis not present

## 2019-08-19 DIAGNOSIS — M4312 Spondylolisthesis, cervical region: Secondary | ICD-10-CM | POA: Diagnosis not present

## 2019-08-19 DIAGNOSIS — M47812 Spondylosis without myelopathy or radiculopathy, cervical region: Secondary | ICD-10-CM | POA: Diagnosis not present

## 2019-08-19 DIAGNOSIS — L304 Erythema intertrigo: Secondary | ICD-10-CM | POA: Diagnosis not present

## 2019-08-19 DIAGNOSIS — M4802 Spinal stenosis, cervical region: Secondary | ICD-10-CM | POA: Diagnosis not present

## 2019-08-19 DIAGNOSIS — D225 Melanocytic nevi of trunk: Secondary | ICD-10-CM | POA: Diagnosis not present

## 2019-08-19 DIAGNOSIS — S12030D Displaced posterior arch fracture of first cervical vertebra, subsequent encounter for fracture with routine healing: Secondary | ICD-10-CM | POA: Diagnosis not present

## 2019-08-19 DIAGNOSIS — L814 Other melanin hyperpigmentation: Secondary | ICD-10-CM | POA: Diagnosis not present

## 2019-08-20 DIAGNOSIS — M4312 Spondylolisthesis, cervical region: Secondary | ICD-10-CM | POA: Diagnosis not present

## 2019-08-20 DIAGNOSIS — M4313 Spondylolisthesis, cervicothoracic region: Secondary | ICD-10-CM | POA: Diagnosis not present

## 2019-08-20 DIAGNOSIS — M4802 Spinal stenosis, cervical region: Secondary | ICD-10-CM | POA: Diagnosis not present

## 2019-08-20 DIAGNOSIS — S12190D Other displaced fracture of second cervical vertebra, subsequent encounter for fracture with routine healing: Secondary | ICD-10-CM | POA: Diagnosis not present

## 2019-08-20 DIAGNOSIS — S12030D Displaced posterior arch fracture of first cervical vertebra, subsequent encounter for fracture with routine healing: Secondary | ICD-10-CM | POA: Diagnosis not present

## 2019-08-20 DIAGNOSIS — M47812 Spondylosis without myelopathy or radiculopathy, cervical region: Secondary | ICD-10-CM | POA: Diagnosis not present

## 2019-08-21 DIAGNOSIS — M4802 Spinal stenosis, cervical region: Secondary | ICD-10-CM | POA: Diagnosis not present

## 2019-08-21 DIAGNOSIS — S12190D Other displaced fracture of second cervical vertebra, subsequent encounter for fracture with routine healing: Secondary | ICD-10-CM | POA: Diagnosis not present

## 2019-08-21 DIAGNOSIS — S12030D Displaced posterior arch fracture of first cervical vertebra, subsequent encounter for fracture with routine healing: Secondary | ICD-10-CM | POA: Diagnosis not present

## 2019-08-21 DIAGNOSIS — M4312 Spondylolisthesis, cervical region: Secondary | ICD-10-CM | POA: Diagnosis not present

## 2019-08-21 DIAGNOSIS — M4313 Spondylolisthesis, cervicothoracic region: Secondary | ICD-10-CM | POA: Diagnosis not present

## 2019-08-21 DIAGNOSIS — M47812 Spondylosis without myelopathy or radiculopathy, cervical region: Secondary | ICD-10-CM | POA: Diagnosis not present

## 2019-08-22 DIAGNOSIS — M4312 Spondylolisthesis, cervical region: Secondary | ICD-10-CM | POA: Diagnosis not present

## 2019-08-22 DIAGNOSIS — K5900 Constipation, unspecified: Secondary | ICD-10-CM | POA: Diagnosis not present

## 2019-08-22 DIAGNOSIS — Z6841 Body Mass Index (BMI) 40.0 and over, adult: Secondary | ICD-10-CM | POA: Diagnosis not present

## 2019-08-22 DIAGNOSIS — M47812 Spondylosis without myelopathy or radiculopathy, cervical region: Secondary | ICD-10-CM | POA: Diagnosis not present

## 2019-08-22 DIAGNOSIS — E7800 Pure hypercholesterolemia, unspecified: Secondary | ICD-10-CM | POA: Diagnosis not present

## 2019-08-22 DIAGNOSIS — E890 Postprocedural hypothyroidism: Secondary | ICD-10-CM | POA: Diagnosis not present

## 2019-08-22 DIAGNOSIS — S12030D Displaced posterior arch fracture of first cervical vertebra, subsequent encounter for fracture with routine healing: Secondary | ICD-10-CM | POA: Diagnosis not present

## 2019-08-22 DIAGNOSIS — Z79891 Long term (current) use of opiate analgesic: Secondary | ICD-10-CM | POA: Diagnosis not present

## 2019-08-22 DIAGNOSIS — E785 Hyperlipidemia, unspecified: Secondary | ICD-10-CM | POA: Diagnosis not present

## 2019-08-22 DIAGNOSIS — B0089 Other herpesviral infection: Secondary | ICD-10-CM | POA: Diagnosis not present

## 2019-08-22 DIAGNOSIS — M4802 Spinal stenosis, cervical region: Secondary | ICD-10-CM | POA: Diagnosis not present

## 2019-08-22 DIAGNOSIS — Z96652 Presence of left artificial knee joint: Secondary | ICD-10-CM | POA: Diagnosis not present

## 2019-08-22 DIAGNOSIS — Z792 Long term (current) use of antibiotics: Secondary | ICD-10-CM | POA: Diagnosis not present

## 2019-08-22 DIAGNOSIS — E669 Obesity, unspecified: Secondary | ICD-10-CM | POA: Diagnosis not present

## 2019-08-22 DIAGNOSIS — M4050 Lordosis, unspecified, site unspecified: Secondary | ICD-10-CM | POA: Diagnosis not present

## 2019-08-22 DIAGNOSIS — M810 Age-related osteoporosis without current pathological fracture: Secondary | ICD-10-CM | POA: Diagnosis not present

## 2019-08-22 DIAGNOSIS — I10 Essential (primary) hypertension: Secondary | ICD-10-CM | POA: Diagnosis not present

## 2019-08-22 DIAGNOSIS — Z79899 Other long term (current) drug therapy: Secondary | ICD-10-CM | POA: Diagnosis not present

## 2019-08-22 DIAGNOSIS — M8938 Hypertrophy of bone, other site: Secondary | ICD-10-CM | POA: Diagnosis not present

## 2019-08-22 DIAGNOSIS — Z7982 Long term (current) use of aspirin: Secondary | ICD-10-CM | POA: Diagnosis not present

## 2019-08-22 DIAGNOSIS — S12190D Other displaced fracture of second cervical vertebra, subsequent encounter for fracture with routine healing: Secondary | ICD-10-CM | POA: Diagnosis not present

## 2019-08-22 DIAGNOSIS — M4313 Spondylolisthesis, cervicothoracic region: Secondary | ICD-10-CM | POA: Diagnosis not present

## 2019-08-22 DIAGNOSIS — N952 Postmenopausal atrophic vaginitis: Secondary | ICD-10-CM | POA: Diagnosis not present

## 2019-08-22 DIAGNOSIS — M2578 Osteophyte, vertebrae: Secondary | ICD-10-CM | POA: Diagnosis not present

## 2019-08-22 DIAGNOSIS — N814 Uterovaginal prolapse, unspecified: Secondary | ICD-10-CM | POA: Diagnosis not present

## 2019-08-24 DIAGNOSIS — M4802 Spinal stenosis, cervical region: Secondary | ICD-10-CM | POA: Diagnosis not present

## 2019-08-24 DIAGNOSIS — M4312 Spondylolisthesis, cervical region: Secondary | ICD-10-CM | POA: Diagnosis not present

## 2019-08-24 DIAGNOSIS — M4313 Spondylolisthesis, cervicothoracic region: Secondary | ICD-10-CM | POA: Diagnosis not present

## 2019-08-24 DIAGNOSIS — M47812 Spondylosis without myelopathy or radiculopathy, cervical region: Secondary | ICD-10-CM | POA: Diagnosis not present

## 2019-08-24 DIAGNOSIS — S12190D Other displaced fracture of second cervical vertebra, subsequent encounter for fracture with routine healing: Secondary | ICD-10-CM | POA: Diagnosis not present

## 2019-08-24 DIAGNOSIS — S12030D Displaced posterior arch fracture of first cervical vertebra, subsequent encounter for fracture with routine healing: Secondary | ICD-10-CM | POA: Diagnosis not present

## 2019-08-25 DIAGNOSIS — S12030D Displaced posterior arch fracture of first cervical vertebra, subsequent encounter for fracture with routine healing: Secondary | ICD-10-CM | POA: Diagnosis not present

## 2019-08-25 DIAGNOSIS — Z6831 Body mass index (BMI) 31.0-31.9, adult: Secondary | ICD-10-CM | POA: Diagnosis not present

## 2019-08-25 DIAGNOSIS — M4312 Spondylolisthesis, cervical region: Secondary | ICD-10-CM | POA: Diagnosis not present

## 2019-08-25 DIAGNOSIS — M4802 Spinal stenosis, cervical region: Secondary | ICD-10-CM | POA: Diagnosis not present

## 2019-08-25 DIAGNOSIS — M47812 Spondylosis without myelopathy or radiculopathy, cervical region: Secondary | ICD-10-CM | POA: Diagnosis not present

## 2019-08-25 DIAGNOSIS — S12031A Nondisplaced posterior arch fracture of first cervical vertebra, initial encounter for closed fracture: Secondary | ICD-10-CM | POA: Diagnosis not present

## 2019-08-25 DIAGNOSIS — R030 Elevated blood-pressure reading, without diagnosis of hypertension: Secondary | ICD-10-CM | POA: Diagnosis not present

## 2019-08-25 DIAGNOSIS — S12190D Other displaced fracture of second cervical vertebra, subsequent encounter for fracture with routine healing: Secondary | ICD-10-CM | POA: Diagnosis not present

## 2019-08-25 DIAGNOSIS — M4313 Spondylolisthesis, cervicothoracic region: Secondary | ICD-10-CM | POA: Diagnosis not present

## 2019-08-26 DIAGNOSIS — E669 Obesity, unspecified: Secondary | ICD-10-CM | POA: Diagnosis not present

## 2019-08-26 DIAGNOSIS — M810 Age-related osteoporosis without current pathological fracture: Secondary | ICD-10-CM | POA: Diagnosis not present

## 2019-08-26 DIAGNOSIS — I10 Essential (primary) hypertension: Secondary | ICD-10-CM | POA: Diagnosis not present

## 2019-08-26 DIAGNOSIS — E782 Mixed hyperlipidemia: Secondary | ICD-10-CM | POA: Diagnosis not present

## 2019-08-26 DIAGNOSIS — R55 Syncope and collapse: Secondary | ICD-10-CM | POA: Diagnosis not present

## 2019-08-27 DIAGNOSIS — S12190D Other displaced fracture of second cervical vertebra, subsequent encounter for fracture with routine healing: Secondary | ICD-10-CM | POA: Diagnosis not present

## 2019-08-27 DIAGNOSIS — M4313 Spondylolisthesis, cervicothoracic region: Secondary | ICD-10-CM | POA: Diagnosis not present

## 2019-08-27 DIAGNOSIS — M4802 Spinal stenosis, cervical region: Secondary | ICD-10-CM | POA: Diagnosis not present

## 2019-08-27 DIAGNOSIS — S12030D Displaced posterior arch fracture of first cervical vertebra, subsequent encounter for fracture with routine healing: Secondary | ICD-10-CM | POA: Diagnosis not present

## 2019-08-27 DIAGNOSIS — M47812 Spondylosis without myelopathy or radiculopathy, cervical region: Secondary | ICD-10-CM | POA: Diagnosis not present

## 2019-08-27 DIAGNOSIS — M4312 Spondylolisthesis, cervical region: Secondary | ICD-10-CM | POA: Diagnosis not present

## 2019-08-31 DIAGNOSIS — M4802 Spinal stenosis, cervical region: Secondary | ICD-10-CM | POA: Diagnosis not present

## 2019-08-31 DIAGNOSIS — S12030D Displaced posterior arch fracture of first cervical vertebra, subsequent encounter for fracture with routine healing: Secondary | ICD-10-CM | POA: Diagnosis not present

## 2019-08-31 DIAGNOSIS — M4312 Spondylolisthesis, cervical region: Secondary | ICD-10-CM | POA: Diagnosis not present

## 2019-08-31 DIAGNOSIS — M47812 Spondylosis without myelopathy or radiculopathy, cervical region: Secondary | ICD-10-CM | POA: Diagnosis not present

## 2019-08-31 DIAGNOSIS — S12190D Other displaced fracture of second cervical vertebra, subsequent encounter for fracture with routine healing: Secondary | ICD-10-CM | POA: Diagnosis not present

## 2019-08-31 DIAGNOSIS — M4313 Spondylolisthesis, cervicothoracic region: Secondary | ICD-10-CM | POA: Diagnosis not present

## 2019-09-01 DIAGNOSIS — S12190D Other displaced fracture of second cervical vertebra, subsequent encounter for fracture with routine healing: Secondary | ICD-10-CM | POA: Diagnosis not present

## 2019-09-01 DIAGNOSIS — M4313 Spondylolisthesis, cervicothoracic region: Secondary | ICD-10-CM | POA: Diagnosis not present

## 2019-09-01 DIAGNOSIS — S12030D Displaced posterior arch fracture of first cervical vertebra, subsequent encounter for fracture with routine healing: Secondary | ICD-10-CM | POA: Diagnosis not present

## 2019-09-01 DIAGNOSIS — M4312 Spondylolisthesis, cervical region: Secondary | ICD-10-CM | POA: Diagnosis not present

## 2019-09-01 DIAGNOSIS — M4802 Spinal stenosis, cervical region: Secondary | ICD-10-CM | POA: Diagnosis not present

## 2019-09-01 DIAGNOSIS — M47812 Spondylosis without myelopathy or radiculopathy, cervical region: Secondary | ICD-10-CM | POA: Diagnosis not present

## 2019-09-02 DIAGNOSIS — S12190D Other displaced fracture of second cervical vertebra, subsequent encounter for fracture with routine healing: Secondary | ICD-10-CM | POA: Diagnosis not present

## 2019-09-02 DIAGNOSIS — M47812 Spondylosis without myelopathy or radiculopathy, cervical region: Secondary | ICD-10-CM | POA: Diagnosis not present

## 2019-09-02 DIAGNOSIS — M4802 Spinal stenosis, cervical region: Secondary | ICD-10-CM | POA: Diagnosis not present

## 2019-09-02 DIAGNOSIS — S12030D Displaced posterior arch fracture of first cervical vertebra, subsequent encounter for fracture with routine healing: Secondary | ICD-10-CM | POA: Diagnosis not present

## 2019-09-02 DIAGNOSIS — M4312 Spondylolisthesis, cervical region: Secondary | ICD-10-CM | POA: Diagnosis not present

## 2019-09-02 DIAGNOSIS — M4313 Spondylolisthesis, cervicothoracic region: Secondary | ICD-10-CM | POA: Diagnosis not present

## 2019-09-09 DIAGNOSIS — M4312 Spondylolisthesis, cervical region: Secondary | ICD-10-CM | POA: Diagnosis not present

## 2019-09-09 DIAGNOSIS — M4802 Spinal stenosis, cervical region: Secondary | ICD-10-CM | POA: Diagnosis not present

## 2019-09-09 DIAGNOSIS — M4313 Spondylolisthesis, cervicothoracic region: Secondary | ICD-10-CM | POA: Diagnosis not present

## 2019-09-09 DIAGNOSIS — S12030D Displaced posterior arch fracture of first cervical vertebra, subsequent encounter for fracture with routine healing: Secondary | ICD-10-CM | POA: Diagnosis not present

## 2019-09-09 DIAGNOSIS — M47812 Spondylosis without myelopathy or radiculopathy, cervical region: Secondary | ICD-10-CM | POA: Diagnosis not present

## 2019-09-09 DIAGNOSIS — S12190D Other displaced fracture of second cervical vertebra, subsequent encounter for fracture with routine healing: Secondary | ICD-10-CM | POA: Diagnosis not present

## 2019-09-22 ENCOUNTER — Other Ambulatory Visit: Payer: Self-pay | Admitting: Student

## 2019-09-22 DIAGNOSIS — S12031K Nondisplaced posterior arch fracture of first cervical vertebra, subsequent encounter for fracture with nonunion: Secondary | ICD-10-CM

## 2019-09-22 DIAGNOSIS — S12031A Nondisplaced posterior arch fracture of first cervical vertebra, initial encounter for closed fracture: Secondary | ICD-10-CM

## 2019-10-02 DIAGNOSIS — M25561 Pain in right knee: Secondary | ICD-10-CM | POA: Diagnosis not present

## 2019-10-02 DIAGNOSIS — M1711 Unilateral primary osteoarthritis, right knee: Secondary | ICD-10-CM | POA: Diagnosis not present

## 2019-10-07 ENCOUNTER — Other Ambulatory Visit: Payer: Medicare Other

## 2019-10-21 DIAGNOSIS — H52203 Unspecified astigmatism, bilateral: Secondary | ICD-10-CM | POA: Diagnosis not present

## 2019-10-21 DIAGNOSIS — H04123 Dry eye syndrome of bilateral lacrimal glands: Secondary | ICD-10-CM | POA: Diagnosis not present

## 2019-10-21 DIAGNOSIS — H43813 Vitreous degeneration, bilateral: Secondary | ICD-10-CM | POA: Diagnosis not present

## 2019-10-21 DIAGNOSIS — Z961 Presence of intraocular lens: Secondary | ICD-10-CM | POA: Diagnosis not present

## 2019-10-22 ENCOUNTER — Other Ambulatory Visit: Payer: Self-pay

## 2019-10-22 ENCOUNTER — Ambulatory Visit
Admission: RE | Admit: 2019-10-22 | Discharge: 2019-10-22 | Disposition: A | Discharge status: Home / Self Care | Payer: Medicare Other | Source: Ambulatory Visit | Attending: Student | Admitting: Student

## 2019-10-22 DIAGNOSIS — S12031A Nondisplaced posterior arch fracture of first cervical vertebra, initial encounter for closed fracture: Secondary | ICD-10-CM

## 2019-10-22 DIAGNOSIS — S12100A Unspecified displaced fracture of second cervical vertebra, initial encounter for closed fracture: Secondary | ICD-10-CM | POA: Diagnosis not present

## 2019-10-22 DIAGNOSIS — S12030K Displaced posterior arch fracture of first cervical vertebra, subsequent encounter for fracture with nonunion: Secondary | ICD-10-CM | POA: Diagnosis not present

## 2019-10-27 ENCOUNTER — Other Ambulatory Visit: Payer: Medicare Other

## 2019-10-27 DIAGNOSIS — Z6830 Body mass index (BMI) 30.0-30.9, adult: Secondary | ICD-10-CM | POA: Diagnosis not present

## 2019-10-27 DIAGNOSIS — I10 Essential (primary) hypertension: Secondary | ICD-10-CM | POA: Diagnosis not present

## 2019-10-27 DIAGNOSIS — S12031A Nondisplaced posterior arch fracture of first cervical vertebra, initial encounter for closed fracture: Secondary | ICD-10-CM | POA: Diagnosis not present

## 2019-12-15 ENCOUNTER — Other Ambulatory Visit: Payer: Self-pay | Admitting: Student

## 2019-12-15 DIAGNOSIS — S12031A Nondisplaced posterior arch fracture of first cervical vertebra, initial encounter for closed fracture: Secondary | ICD-10-CM

## 2020-01-11 ENCOUNTER — Other Ambulatory Visit: Payer: Self-pay

## 2020-01-11 ENCOUNTER — Ambulatory Visit
Admission: RE | Admit: 2020-01-11 | Discharge: 2020-01-11 | Disposition: A | Discharge status: Home / Self Care | Payer: Medicare Other | Source: Ambulatory Visit | Attending: Student | Admitting: Student

## 2020-01-11 DIAGNOSIS — S12000D Unspecified displaced fracture of first cervical vertebra, subsequent encounter for fracture with routine healing: Secondary | ICD-10-CM | POA: Diagnosis not present

## 2020-01-11 DIAGNOSIS — M47812 Spondylosis without myelopathy or radiculopathy, cervical region: Secondary | ICD-10-CM | POA: Diagnosis not present

## 2020-01-11 DIAGNOSIS — M4319 Spondylolisthesis, multiple sites in spine: Secondary | ICD-10-CM | POA: Diagnosis not present

## 2020-01-11 DIAGNOSIS — E049 Nontoxic goiter, unspecified: Secondary | ICD-10-CM | POA: Diagnosis not present

## 2020-01-11 DIAGNOSIS — S12031A Nondisplaced posterior arch fracture of first cervical vertebra, initial encounter for closed fracture: Secondary | ICD-10-CM

## 2020-01-14 DIAGNOSIS — S12031A Nondisplaced posterior arch fracture of first cervical vertebra, initial encounter for closed fracture: Secondary | ICD-10-CM | POA: Diagnosis not present

## 2020-02-09 DIAGNOSIS — Z23 Encounter for immunization: Secondary | ICD-10-CM | POA: Diagnosis not present

## 2020-02-17 ENCOUNTER — Encounter: Payer: Self-pay | Admitting: Obstetrics and Gynecology

## 2020-02-17 DIAGNOSIS — Z1231 Encounter for screening mammogram for malignant neoplasm of breast: Secondary | ICD-10-CM | POA: Diagnosis not present

## 2020-02-29 DIAGNOSIS — M810 Age-related osteoporosis without current pathological fracture: Secondary | ICD-10-CM | POA: Diagnosis not present

## 2020-03-16 DIAGNOSIS — Z23 Encounter for immunization: Secondary | ICD-10-CM | POA: Diagnosis not present

## 2020-03-17 DIAGNOSIS — S12031A Nondisplaced posterior arch fracture of first cervical vertebra, initial encounter for closed fracture: Secondary | ICD-10-CM | POA: Diagnosis not present

## 2020-04-04 ENCOUNTER — Ambulatory Visit: Payer: Medicare Other

## 2020-04-19 ENCOUNTER — Other Ambulatory Visit: Payer: Self-pay | Admitting: *Deleted

## 2020-04-19 DIAGNOSIS — M818 Other osteoporosis without current pathological fracture: Secondary | ICD-10-CM

## 2020-05-11 ENCOUNTER — Other Ambulatory Visit: Payer: Self-pay | Admitting: Obstetrics and Gynecology

## 2020-05-11 ENCOUNTER — Ambulatory Visit (INDEPENDENT_AMBULATORY_CARE_PROVIDER_SITE_OTHER): Payer: Medicare Other

## 2020-05-11 ENCOUNTER — Other Ambulatory Visit: Payer: Self-pay

## 2020-05-11 DIAGNOSIS — Z780 Asymptomatic menopausal state: Secondary | ICD-10-CM

## 2020-05-11 DIAGNOSIS — M810 Age-related osteoporosis without current pathological fracture: Secondary | ICD-10-CM

## 2020-05-11 DIAGNOSIS — M818 Other osteoporosis without current pathological fracture: Secondary | ICD-10-CM

## 2020-06-29 ENCOUNTER — Ambulatory Visit (INDEPENDENT_AMBULATORY_CARE_PROVIDER_SITE_OTHER): Payer: Medicare Other | Admitting: Obstetrics and Gynecology

## 2020-06-29 ENCOUNTER — Other Ambulatory Visit: Payer: Self-pay

## 2020-06-29 ENCOUNTER — Encounter: Payer: Self-pay | Admitting: Obstetrics and Gynecology

## 2020-06-29 VITALS — BP 120/80 | Ht 62.0 in | Wt 162.0 lb

## 2020-06-29 DIAGNOSIS — Z01411 Encounter for gynecological examination (general) (routine) with abnormal findings: Secondary | ICD-10-CM | POA: Diagnosis not present

## 2020-06-29 DIAGNOSIS — M818 Other osteoporosis without current pathological fracture: Secondary | ICD-10-CM

## 2020-06-29 NOTE — Progress Notes (Signed)
Kimberly Webb 1933/03/10 [id number]  SUBJECTIVE:  87 y.o. G2P2002 female for annual routine gynecologic exam. She has no gynecologic concerns.  Current Outpatient Medications  Medication Sig Dispense Refill  . acetaminophen 325 MG tablet Take 2 tablets (650 mg total) by mouth every 6 (six) hours as needed for moderate pain. May be combined with tramadol if needed for pain control. 30 tablet 0  . acyclovir (ZOVIRAX) 400 MG tablet Take 1 tablet (400 mg total) by mouth daily. 90 tablet 3  . amLODipine (NORVASC) 5 MG tablet Take 5 mg by mouth daily.    [name] Kitchen denosumab (PROLIA) 60 MG/ML SOLN injection Inject 60 mg into the skin every 6 (six) months. Administer in upper arm, thigh, or abdomen    . hydrochlorothiazide (HYDRODIURIL) 25 MG tablet Take 25 mg by mouth daily.    [name] Kitchen levothyroxine (SYNTHROID, LEVOTHROID) 100 MCG tablet Take 100 mcg by mouth daily before breakfast.    . lisinopril (ZESTRIL) 10 MG tablet Take 10 mg by mouth daily.    . Multiple Vitamin (MULTIVITAMIN) capsule Take 1 capsule by mouth daily.    . simvastatin (ZOCOR) 40 MG tablet Take 40 mg by mouth at bedtime.    . traMADol (ULTRAM) 50 MG tablet Take 1 tablet (50 mg total) by mouth every 6 (six) hours as needed for moderate pain. 10 tablet 0  . traMADol (ULTRAM) 50 MG tablet 1 to 2 tablets every 6 hours as needed for pain.  May be combined with acetaminophen dosing. 15 tablet 0   No current facility-administered medications for this visit.   Allergies: Patient has no known allergies.  No LMP recorded. Patient has had a hysterectomy.  Past medical history,surgical history, problem list, medications, allergies, family history and social history were all reviewed and documented as reviewed in the EPIC chart.  ROS:  Feeling well. No dyspnea or chest pain on exertion.  No abdominal pain, change in bowel habits, black or bloody stools.  No urinary tract symptoms. GYN ROS: no abnormal bleeding, pelvic pain or discharge, no breast  pain or new or enlarging lumps on self exam. No neurological complaints.    OBJECTIVE:  BP 120/80 (BP Location: Right Arm, Patient Position: Sitting, Cuff Size: Normal)   Ht 5\' 2"  (1.575 m)   Wt 162 lb (73.5 kg)   BMI 29.63 kg/m  The patient appears well, alert, oriented, in no distress.  BREAST EXAM: breasts appear normal, no suspicious masses, no skin or nipple changes or axillary nodes.   PELVIC EXAM: VULVA: normal appearing vulva with no masses, tenderness or lesions,+ atrophic changes, diffusely erythematous appearance of bilateral vulva without excoriation, exudate, or masses. No sign of HSV lesions. VAGINA: normal appearing vagina with normal color and discharge, no lesions, +atrophy. CERVIX: surgically absent, UTERUS: surgically absent, vaginal cuff well healed, ADNEXA: normal adnexa in size, nontender and no masses, no masses  Chaperone: Wandra Scot Bonham present during the examination  ASSESSMENT:  87 y.o. [id number] here for annual gynecologic exam  PLAN:  1. Postmenopausal. No gynecologic concerns. Prior TVH BSO with anterior repair. 2. Pap smear 2012.  No prior abnormal history.  She is comfortable with discontinuing Pap smear screening from this point forward. 3. Mammogram 02/2020. Will continue with annual mammography. Breast exam normal today.   4. Colonoscopy 11 years ago.  Colon cancer screening per the guidance of her primary care physician. 5. Osteoporosis. DEXA 05/2020.  Most recent DEXA 03/2018 T score -2.0 left forearm.    She did  have a cervical neck fracture in 2021 from which she has recovered.  Reviewed her results today that are in osteopenic range.  Encourage vitamin D and calcium, weightbearing exercise.  Previously on Prolia. 6. History of genital HSV. She would like to remain on a prophylactic dose of acyclovir.  She or her pharmacy will notify us when she needs a refill.   7. Health maintenance.  No lab work as she has this completed with her primary care  provider.    Return annually or prn.  Joseph Pierini MD  06/29/20

## 2020-07-04 DIAGNOSIS — E039 Hypothyroidism, unspecified: Secondary | ICD-10-CM | POA: Diagnosis not present

## 2020-07-04 DIAGNOSIS — R7303 Prediabetes: Secondary | ICD-10-CM | POA: Diagnosis not present

## 2020-07-04 DIAGNOSIS — I10 Essential (primary) hypertension: Secondary | ICD-10-CM | POA: Diagnosis not present

## 2020-07-04 DIAGNOSIS — E782 Mixed hyperlipidemia: Secondary | ICD-10-CM | POA: Diagnosis not present

## 2020-07-04 DIAGNOSIS — M810 Age-related osteoporosis without current pathological fracture: Secondary | ICD-10-CM | POA: Diagnosis not present

## 2020-07-04 DIAGNOSIS — E669 Obesity, unspecified: Secondary | ICD-10-CM | POA: Diagnosis not present

## 2020-07-04 DIAGNOSIS — Z0000 Encounter for general adult medical examination without abnormal findings: Secondary | ICD-10-CM | POA: Diagnosis not present

## 2020-07-04 DIAGNOSIS — M150 Primary generalized (osteo)arthritis: Secondary | ICD-10-CM | POA: Diagnosis not present

## 2020-08-17 ENCOUNTER — Telehealth: Payer: Self-pay | Admitting: *Deleted

## 2020-08-17 MED ORDER — ACYCLOVIR 400 MG PO TABS: 400.0000 mg | ORAL_TABLET | Freq: Every day | ORAL | 3 refills | Status: DC

## 2020-08-17 NOTE — Telephone Encounter (Signed)
Patient called requesting refill on acyclovir 400 mg tablet  Per note on 06/29/20 "History of genital HSV. She would like to remain on a prophylactic dose of acyclovir.  She or her pharmacy will notify us when she needs a refill.    Refill sent.

## 2020-08-22 DIAGNOSIS — L814 Other melanin hyperpigmentation: Secondary | ICD-10-CM | POA: Diagnosis not present

## 2020-08-22 DIAGNOSIS — C4441 Basal cell carcinoma of skin of scalp and neck: Secondary | ICD-10-CM | POA: Diagnosis not present

## 2020-08-22 DIAGNOSIS — L821 Other seborrheic keratosis: Secondary | ICD-10-CM | POA: Diagnosis not present

## 2020-08-22 DIAGNOSIS — Z85828 Personal history of other malignant neoplasm of skin: Secondary | ICD-10-CM | POA: Diagnosis not present

## 2020-08-22 DIAGNOSIS — D1801 Hemangioma of skin and subcutaneous tissue: Secondary | ICD-10-CM | POA: Diagnosis not present

## 2020-08-22 DIAGNOSIS — L570 Actinic keratosis: Secondary | ICD-10-CM | POA: Diagnosis not present

## 2020-08-29 DIAGNOSIS — M810 Age-related osteoporosis without current pathological fracture: Secondary | ICD-10-CM | POA: Diagnosis not present

## 2020-09-06 DIAGNOSIS — Z85828 Personal history of other malignant neoplasm of skin: Secondary | ICD-10-CM | POA: Diagnosis not present

## 2020-09-06 DIAGNOSIS — C44319 Basal cell carcinoma of skin of other parts of face: Secondary | ICD-10-CM | POA: Diagnosis not present

## 2020-09-14 DIAGNOSIS — M1711 Unilateral primary osteoarthritis, right knee: Secondary | ICD-10-CM | POA: Diagnosis not present

## 2020-10-21 DIAGNOSIS — H43813 Vitreous degeneration, bilateral: Secondary | ICD-10-CM | POA: Diagnosis not present

## 2020-10-21 DIAGNOSIS — H524 Presbyopia: Secondary | ICD-10-CM | POA: Diagnosis not present

## 2020-10-21 DIAGNOSIS — H04123 Dry eye syndrome of bilateral lacrimal glands: Secondary | ICD-10-CM | POA: Diagnosis not present

## 2020-10-21 DIAGNOSIS — H02403 Unspecified ptosis of bilateral eyelids: Secondary | ICD-10-CM | POA: Diagnosis not present

## 2021-01-05 DIAGNOSIS — M810 Age-related osteoporosis without current pathological fracture: Secondary | ICD-10-CM | POA: Diagnosis not present

## 2021-01-05 DIAGNOSIS — E039 Hypothyroidism, unspecified: Secondary | ICD-10-CM | POA: Diagnosis not present

## 2021-01-05 DIAGNOSIS — R7303 Prediabetes: Secondary | ICD-10-CM | POA: Diagnosis not present

## 2021-01-05 DIAGNOSIS — E669 Obesity, unspecified: Secondary | ICD-10-CM | POA: Diagnosis not present

## 2021-01-05 DIAGNOSIS — M150 Primary generalized (osteo)arthritis: Secondary | ICD-10-CM | POA: Diagnosis not present

## 2021-01-05 DIAGNOSIS — E782 Mixed hyperlipidemia: Secondary | ICD-10-CM | POA: Diagnosis not present

## 2021-01-05 DIAGNOSIS — I10 Essential (primary) hypertension: Secondary | ICD-10-CM | POA: Diagnosis not present

## 2021-01-10 ENCOUNTER — Emergency Department (HOSPITAL_BASED_OUTPATIENT_CLINIC_OR_DEPARTMENT_OTHER)
Admission: EM | Admit: 2021-01-10 | Discharge: 2021-01-10 | Disposition: A | Discharge status: Home / Self Care | Payer: Medicare Other | Attending: Emergency Medicine | Admitting: Emergency Medicine

## 2021-01-10 ENCOUNTER — Encounter (HOSPITAL_BASED_OUTPATIENT_CLINIC_OR_DEPARTMENT_OTHER): Payer: Self-pay | Admitting: *Deleted

## 2021-01-10 ENCOUNTER — Other Ambulatory Visit: Payer: Self-pay

## 2021-01-10 ENCOUNTER — Emergency Department (HOSPITAL_BASED_OUTPATIENT_CLINIC_OR_DEPARTMENT_OTHER): Payer: Medicare Other

## 2021-01-10 DIAGNOSIS — S80211A Abrasion, right knee, initial encounter: Secondary | ICD-10-CM | POA: Insufficient documentation

## 2021-01-10 DIAGNOSIS — E039 Hypothyroidism, unspecified: Secondary | ICD-10-CM | POA: Diagnosis not present

## 2021-01-10 DIAGNOSIS — S2242XA Multiple fractures of ribs, left side, initial encounter for closed fracture: Secondary | ICD-10-CM | POA: Insufficient documentation

## 2021-01-10 DIAGNOSIS — Y92009 Unspecified place in unspecified non-institutional (private) residence as the place of occurrence of the external cause: Secondary | ICD-10-CM | POA: Insufficient documentation

## 2021-01-10 DIAGNOSIS — Z79899 Other long term (current) drug therapy: Secondary | ICD-10-CM | POA: Insufficient documentation

## 2021-01-10 DIAGNOSIS — Z87891 Personal history of nicotine dependence: Secondary | ICD-10-CM | POA: Insufficient documentation

## 2021-01-10 DIAGNOSIS — W108XXA Fall (on) (from) other stairs and steps, initial encounter: Secondary | ICD-10-CM | POA: Insufficient documentation

## 2021-01-10 DIAGNOSIS — M954 Acquired deformity of chest and rib: Secondary | ICD-10-CM | POA: Diagnosis not present

## 2021-01-10 DIAGNOSIS — S50312A Abrasion of left elbow, initial encounter: Secondary | ICD-10-CM | POA: Insufficient documentation

## 2021-01-10 DIAGNOSIS — Z043 Encounter for examination and observation following other accident: Secondary | ICD-10-CM | POA: Diagnosis not present

## 2021-01-10 DIAGNOSIS — W19XXXA Unspecified fall, initial encounter: Secondary | ICD-10-CM

## 2021-01-10 DIAGNOSIS — I10 Essential (primary) hypertension: Secondary | ICD-10-CM | POA: Insufficient documentation

## 2021-01-10 DIAGNOSIS — S60511A Abrasion of right hand, initial encounter: Secondary | ICD-10-CM | POA: Insufficient documentation

## 2021-01-10 DIAGNOSIS — S299XXA Unspecified injury of thorax, initial encounter: Secondary | ICD-10-CM | POA: Diagnosis present

## 2021-01-10 MED ORDER — LIDOCAINE 5 % EX PTCH
1.0000 | MEDICATED_PATCH | CUTANEOUS | Status: DC
  Administered 2021-01-10: 1 via TRANSDERMAL
  Filled 2021-01-10: qty 1

## 2021-01-10 MED ORDER — LIDOCAINE 5 % EX PTCH: 1.0000 | MEDICATED_PATCH | CUTANEOUS | 0 refills | Status: DC

## 2021-01-10 MED ORDER — TRAMADOL HCL 50 MG PO TABS
50.0000 mg | ORAL_TABLET | Freq: Once | ORAL | Status: AC
  Administered 2021-01-10: 50 mg via ORAL
  Filled 2021-01-10: qty 1

## 2021-01-10 MED ORDER — TRAMADOL HCL 50 MG PO TABS: 50.0000 mg | ORAL_TABLET | Freq: Four times a day (QID) | ORAL | 0 refills | Status: DC | PRN

## 2021-01-10 NOTE — ED Triage Notes (Signed)
She was coming back in the house after taking the dog out. She fell on one concrete step. Injury to her left ribs.

## 2021-01-10 NOTE — ED Provider Notes (Signed)
[facility] Provider Note   CSN: [id number] Arrival date & time: 01/10/21  1324     History Chief Complaint  Patient presents with   Kimberly Webb is a 87 y.o. female.  Pt presents to the ED today with left rib pain.  Pt was coming back in her house after taking out her dog and fell on the steps.  She hit her left side against the step.  She denies any other injury.  She did not hit her head.  She is not on blood thinners.  Injury occurred on 8/7.  She also hit her left elbow, right hand, and right knee.  She does not want x-rays of those areas.      Past Medical History:  Diagnosis Date   Arthritis    Atrophic vaginitis    Cystocele    Fibroid    Hypercholesteremia    Hypertension    Hypothyroidism    Osteoporosis    Paratubal cyst    Rectocele    STD (sexually transmitted disease)    HSV   Uterine prolapse     Patient Active Problem List   Diagnosis Date Noted   C1 cervical fracture ([place]) 07/21/2019   Neck fracture ([place]) 07/21/2019   Vaginal discharge 09/07/2016   Menopause 09/06/2016   History of herpes genitalis 04/04/2016   Skin mole 04/01/2013   STD (sexually transmitted disease)    Rectocele    Atrophic vaginitis    Osteoporosis    Hypercholesteremia    Arthritis    Hypothyroidism     Past Surgical History:  Procedure Laterality Date   BROKEN ANKLE     BROKEN FEMUR     CATARACT EXTRACTION Bilateral    FOOT SURGERY Right    TOE   HERNIA REPAIR  2009   OOPHORECTOMY     BSO WITH VAG HYST IN 2004   REPLACEMENT TOTAL KNEE  2011   LEFT X 2   THYROIDECTOMY, PARTIAL     VAGINAL HYSTERECTOMY  2004   VAG HYST, BSO, A&P REPAIR     OB History     Gravida  2   Para  2   Term  2   Preterm      AB      Living  2      SAB      IAB      Ectopic      Multiple      Live Births              Family History  Problem Relation Age of Onset   Heart disease Mother    Hypertension Father     Hypertension Sister    Leukemia Sister    Heart disease Brother    Multiple sclerosis Brother    Parkinsonism Brother     Social History   Tobacco Use   Smoking status: Former   Smokeless tobacco: Never  Scientific laboratory technician Use: Never used  Substance Use Topics   Alcohol use: No   Drug use: No    Home Medications Prior to Admission medications   Medication Sig Start Date End Date Taking? Authorizing Provider  lidocaine (LIDODERM) 5 % Place 1 patch onto the skin daily. Remove & Discard patch within 12 hours or as directed by MD 01/10/21  Yes Isla Pence, MD  traMADol (ULTRAM) 50 MG tablet Take 1 tablet (50 mg total) by mouth every 6 (six)  hours as needed. 01/10/21  Yes Isla Pence, MD  acetaminophen 325 MG tablet Take 2 tablets (650 mg total) by mouth every 6 (six) hours as needed for moderate pain. May be combined with tramadol if needed for pain control. 07/29/19   Charlesetta Shanks, MD  acyclovir (ZOVIRAX) 400 MG tablet Take 1 tablet (400 mg total) by mouth daily. 08/17/20   Princess Bruins, MD  amLODipine (NORVASC) 5 MG tablet Take 5 mg by mouth daily.    [provider]  denosumab (PROLIA) 60 MG/ML SOLN injection Inject 60 mg into the skin every 6 (six) months. Administer in upper arm, thigh, or abdomen    [provider]  hydrochlorothiazide (HYDRODIURIL) 25 MG tablet Take 25 mg by mouth daily. 08/04/13   [provider]  levothyroxine (SYNTHROID, LEVOTHROID) 100 MCG tablet Take 100 mcg by mouth daily before breakfast.    [provider]  lisinopril (ZESTRIL) 10 MG tablet Take 10 mg by mouth daily. 05/08/19   [provider]  Multiple Vitamin (MULTIVITAMIN) capsule Take 1 capsule by mouth daily.    [provider]  simvastatin (ZOCOR) 40 MG tablet Take 40 mg by mouth at bedtime.    [provider]    Allergies    Patient has no known allergies.  Review of Systems   Review of Systems  Musculoskeletal:         Left chest wall pain  All other systems reviewed and are negative.  Physical Exam Updated Vital Signs BP 135/62 (BP Location: Right Arm)   Pulse 69   Temp 98.8 F (37.1 C) (Oral)   Resp 18   Ht '5\' 2"'$  (1.575 m)   Wt 73.5 kg   SpO2 96%   BMI 29.64 kg/m   Physical Exam Vitals and nursing note reviewed.  Constitutional:      Appearance: Normal appearance.  HENT:     Head: Normocephalic and atraumatic.     Right Ear: External ear normal.     Left Ear: External ear normal.     Nose: Nose normal.     Mouth/Throat:     Mouth: Mucous membranes are moist.     Pharynx: Oropharynx is clear.  Eyes:     Extraocular Movements: Extraocular movements intact.     Conjunctiva/sclera: Conjunctivae normal.     Pupils: Pupils are equal, round, and reactive to light.  Cardiovascular:     Rate and Rhythm: Normal rate and regular rhythm.     Pulses: Normal pulses.     Heart sounds: Normal heart sounds.  Pulmonary:     Effort: Pulmonary effort is normal.     Breath sounds: Normal breath sounds.  Chest:    Abdominal:     General: Abdomen is flat. Bowel sounds are normal.     Palpations: Abdomen is soft.  Musculoskeletal:        General: Normal range of motion.     Cervical back: Normal range of motion and neck supple.  Skin:    General: Skin is warm.     Capillary Refill: Capillary refill takes less than 2 seconds.     Comments: Left elbow abrasion, right knee abrasion, right finger abrasions  Neurological:     General: No focal deficit present.     Mental Status: She is alert and oriented to person, place, and time.  Psychiatric:        Mood and Affect: Mood normal.        Behavior: Behavior normal.  ED Results / Procedures / Treatments   Labs (all labs ordered are listed, but only abnormal results are displayed) Labs Reviewed - No data to display  EKG None  Radiology DG Ribs Unilateral W/Chest Left  Result Date: 01/10/2021 CLINICAL DATA:  Fall the EXAM: LEFT RIBS AND  CHEST - 3+ VIEW COMPARISON:  None. FINDINGS: Cardiac contours are normal in size. Unchanged tortuosity of the thoracic aorta. Lungs are clear. Mild step-off deformities of the approximate anterior left 6th and 7th ribs, concerning for fractures. IMPRESSION: Mild step-off deformities of the anterior left sixth and seventh ribs, concerning for fractures. Electronically Signed   By: Yetta Glassman MD   On: 01/10/2021 14:47    Procedures Procedures   Medications Ordered in ED Medications  lidocaine (LIDODERM) 5 % 1 patch (has no administration in time range)  traMADol (ULTRAM) tablet 50 mg (has no administration in time range)    ED Course  I have reviewed the triage vital signs and the nursing notes.  Pertinent labs & imaging results that were available during my care of the patient were reviewed by me and considered in my medical decision making (see chart for details).    MDM Rules/Calculators/A&P                           Pt with left 6th and 7th rib fx.  She is given an incentive spirometer and lidoderm/ultram.  She is able to ambulate without problems and is saturating well.  She is stable for d/c. Final Clinical Impression(s) / ED Diagnoses Final diagnoses:  Fall, initial encounter  Closed fracture of multiple ribs of left side, initial encounter    Rx / DC Orders ED Discharge Orders          Ordered    traMADol (ULTRAM) 50 MG tablet  Every 6 hours PRN        01/10/21 1528    lidocaine (LIDODERM) 5 %  Every 24 hours        01/10/21 1528             Isla Pence, MD 01/10/21 1535

## 2021-01-18 DIAGNOSIS — Z23 Encounter for immunization: Secondary | ICD-10-CM | POA: Diagnosis not present

## 2021-01-20 DIAGNOSIS — I10 Essential (primary) hypertension: Secondary | ICD-10-CM | POA: Diagnosis not present

## 2021-01-20 DIAGNOSIS — E039 Hypothyroidism, unspecified: Secondary | ICD-10-CM | POA: Diagnosis not present

## 2021-01-20 DIAGNOSIS — S2239XA Fracture of one rib, unspecified side, initial encounter for closed fracture: Secondary | ICD-10-CM | POA: Diagnosis not present

## 2021-02-17 DIAGNOSIS — Z1231 Encounter for screening mammogram for malignant neoplasm of breast: Secondary | ICD-10-CM | POA: Diagnosis not present

## 2021-02-22 ENCOUNTER — Encounter: Payer: Self-pay | Admitting: Obstetrics & Gynecology

## 2021-02-27 ENCOUNTER — Emergency Department (HOSPITAL_BASED_OUTPATIENT_CLINIC_OR_DEPARTMENT_OTHER): Payer: Medicare Other

## 2021-02-27 ENCOUNTER — Other Ambulatory Visit: Payer: Self-pay

## 2021-02-27 ENCOUNTER — Encounter (HOSPITAL_BASED_OUTPATIENT_CLINIC_OR_DEPARTMENT_OTHER): Payer: Self-pay

## 2021-02-27 DIAGNOSIS — E039 Hypothyroidism, unspecified: Secondary | ICD-10-CM | POA: Insufficient documentation

## 2021-02-27 DIAGNOSIS — S42022A Displaced fracture of shaft of left clavicle, initial encounter for closed fracture: Secondary | ICD-10-CM | POA: Insufficient documentation

## 2021-02-27 DIAGNOSIS — Z79899 Other long term (current) drug therapy: Secondary | ICD-10-CM | POA: Diagnosis not present

## 2021-02-27 DIAGNOSIS — Y92009 Unspecified place in unspecified non-institutional (private) residence as the place of occurrence of the external cause: Secondary | ICD-10-CM | POA: Insufficient documentation

## 2021-02-27 DIAGNOSIS — I10 Essential (primary) hypertension: Secondary | ICD-10-CM | POA: Insufficient documentation

## 2021-02-27 DIAGNOSIS — Z87891 Personal history of nicotine dependence: Secondary | ICD-10-CM | POA: Insufficient documentation

## 2021-02-27 DIAGNOSIS — W010XXA Fall on same level from slipping, tripping and stumbling without subsequent striking against object, initial encounter: Secondary | ICD-10-CM | POA: Insufficient documentation

## 2021-02-27 DIAGNOSIS — S42032A Displaced fracture of lateral end of left clavicle, initial encounter for closed fracture: Secondary | ICD-10-CM | POA: Diagnosis not present

## 2021-02-27 DIAGNOSIS — S4992XA Unspecified injury of left shoulder and upper arm, initial encounter: Secondary | ICD-10-CM | POA: Diagnosis present

## 2021-02-27 DIAGNOSIS — Z96652 Presence of left artificial knee joint: Secondary | ICD-10-CM | POA: Insufficient documentation

## 2021-02-27 NOTE — ED Triage Notes (Addendum)
Pt states she tripped/fell ~730pm-pain to left shoulder-denies head/neck injury/pain-NAD-to triage in w/c

## 2021-02-28 ENCOUNTER — Emergency Department (HOSPITAL_BASED_OUTPATIENT_CLINIC_OR_DEPARTMENT_OTHER)
Admission: EM | Admit: 2021-02-28 | Discharge: 2021-02-28 | Disposition: A | Discharge status: Home / Self Care | Payer: Medicare Other | Attending: Emergency Medicine | Admitting: Emergency Medicine

## 2021-02-28 DIAGNOSIS — S42022A Displaced fracture of shaft of left clavicle, initial encounter for closed fracture: Secondary | ICD-10-CM

## 2021-02-28 DIAGNOSIS — W19XXXA Unspecified fall, initial encounter: Secondary | ICD-10-CM

## 2021-02-28 MED ORDER — ACETAMINOPHEN 500 MG PO TABS
1000.0000 mg | ORAL_TABLET | Freq: Three times a day (TID) | ORAL | 0 refills | Status: AC
Start: 2021-02-28 — End: 2021-03-05

## 2021-02-28 MED ORDER — HYDROCODONE-ACETAMINOPHEN 5-325 MG PO TABS
1.0000 | ORAL_TABLET | Freq: Once | ORAL | Status: AC
  Administered 2021-02-28: 1 via ORAL
  Filled 2021-02-28: qty 1

## 2021-02-28 MED ORDER — OXYCODONE HCL 5 MG PO TABS: 2.5000 mg | ORAL_TABLET | Freq: Four times a day (QID) | ORAL | 0 refills | Status: AC | PRN

## 2021-02-28 NOTE — ED Provider Notes (Signed)
[facility] Provider Note  CSN: [id number] Arrival date & time: 02/27/21 2150  Chief Complaint(s) Fall  HPI NAIOMI Webb is a 87 y.o. female who presents to the emergency department with left shoulder pain after mechanical fall at home around 7:30 PM.  She reports that she was trying to open up the car door when she tripped and fell backwards landing on her left shoulder.  She felt immediate pain about her clavicle.  Pain worse with palpation and range of motion of the left arm.  Alleviated by mobility.  She denied any head trauma or loss of consciousness.  She is not anticoagulated.  She denies any neck pain.  No chest pain, abdominal pain, back pain or other extremity pain.   Fall   Past Medical History Past Medical History:  Diagnosis Date  . Arthritis   . Atrophic vaginitis   . Cystocele   . Fibroid   . Hypercholesteremia   . Hypertension   . Hypothyroidism   . Osteoporosis   . Paratubal cyst   . Rectocele   . STD (sexually transmitted disease)    HSV  . Uterine prolapse    Patient Active Problem List   Diagnosis Date Noted  . C1 cervical fracture ([place]) 07/21/2019  . Neck fracture ([place]) 07/21/2019  . Vaginal discharge 09/07/2016  . Menopause 09/06/2016  . History of herpes genitalis 04/04/2016  . Skin mole 04/01/2013  . STD (sexually transmitted disease)   . Rectocele   . Atrophic vaginitis   . Osteoporosis   . Hypercholesteremia   . Arthritis   . Hypothyroidism    Home Medication(s) Prior to Admission medications   Medication Sig Start Date End Date Taking? Authorizing Provider  acetaminophen (TYLENOL) 500 MG tablet Take 2 tablets (1,000 mg total) by mouth every 8 (eight) hours for 5 days. Do not take more than 4000 mg of acetaminophen (Tylenol) in a 24-hour period. Please note that other medicines that you may be prescribed may have Tylenol as well. 02/28/21 03/05/21 Yes Mialee Weyman, Grayce Sessions, MD  oxyCODONE (ROXICODONE) 5 MG  immediate release tablet Take 0.5-1 tablets (2.5-5 mg total) by mouth every 6 (six) hours as needed for up to 5 days for severe pain. 02/28/21 03/05/21 Yes Nigil Braman, Grayce Sessions, MD  acyclovir (ZOVIRAX) 400 MG tablet Take 1 tablet (400 mg total) by mouth daily. 08/17/20   Princess Bruins, MD  amLODipine (NORVASC) 5 MG tablet Take 5 mg by mouth daily.    [provider]  denosumab (PROLIA) 60 MG/ML SOLN injection Inject 60 mg into the skin every 6 (six) months. Administer in upper arm, thigh, or abdomen    [provider]  hydrochlorothiazide (HYDRODIURIL) 25 MG tablet Take 25 mg by mouth daily. 08/04/13   [provider]  levothyroxine (SYNTHROID, LEVOTHROID) 100 MCG tablet Take 100 mcg by mouth daily before breakfast.    [provider]  lidocaine (LIDODERM) 5 % Place 1 patch onto the skin daily. Remove & Discard patch within 12 hours or as directed by MD 01/10/21   Isla Pence, MD  lisinopril (ZESTRIL) 10 MG tablet Take 10 mg by mouth daily. 05/08/19   [provider]  Multiple Vitamin (MULTIVITAMIN) capsule Take 1 capsule by mouth daily.    [provider]  simvastatin (ZOCOR) 40 MG tablet Take 40 mg by mouth at bedtime.    [provider]  traMADol (ULTRAM) 50 MG tablet Take 1 tablet (50 mg total) by mouth every 6 (  six) hours as needed. 01/10/21   Isla Pence, MD                                                                                                                                    Past Surgical History Past Surgical History:  Procedure Laterality Date  . BROKEN ANKLE    . BROKEN FEMUR    . CATARACT EXTRACTION Bilateral   . FOOT SURGERY Right    TOE  . HERNIA REPAIR  2009  . OOPHORECTOMY     BSO WITH VAG HYST IN 2004  . REPLACEMENT TOTAL KNEE  2011   LEFT X 2  . THYROIDECTOMY, PARTIAL    . VAGINAL HYSTERECTOMY  2004   VAG HYST, BSO, A&P REPAIR   Family History Family History  Problem Relation Age of Onset   . Heart disease Mother   . Hypertension Father   . Hypertension Sister   . Leukemia Sister   . Heart disease Brother   . Multiple sclerosis Brother   . Parkinsonism Brother     Social History Social History   Tobacco Use  . Smoking status: Former  . Smokeless tobacco: Never  Vaping Use  . Vaping Use: Never used  Substance Use Topics  . Alcohol use: No  . Drug use: No   Allergies Patient has no known allergies.  Review of Systems Review of Systems All other systems are reviewed and are negative for acute change except as noted in the HPI  Physical Exam Vital Signs  I have reviewed the triage vital signs BP 140/65 (BP Location: Right Arm)   Pulse (!) 56   Temp 97.8 F (36.6 C) (Oral)   Resp 16   Ht 5\' 3"  (1.6 m)   Wt 72.6 kg   SpO2 97%   BMI 28.34 kg/m   Physical Exam Constitutional:      General: She is not in acute distress.    Appearance: She is well-developed. She is not diaphoretic.  HENT:     Head: Normocephalic and atraumatic.     Right Ear: External ear normal.     Left Ear: External ear normal.     Nose: Nose normal.  Eyes:     General: No scleral icterus.       Right eye: No discharge.        Left eye: No discharge.     Conjunctiva/sclera: Conjunctivae normal.     Pupils: Pupils are equal, round, and reactive to light.  Cardiovascular:     Rate and Rhythm: Normal rate and regular rhythm.     Pulses:          Radial pulses are 2+ on the right side and 2+ on the left side.       Dorsalis pedis pulses are 2+ on the right side and 2+ on the left side.     Heart sounds: Normal heart sounds. No murmur  heard.   No friction rub. No gallop.  Pulmonary:     Effort: Pulmonary effort is normal. No respiratory distress.     Breath sounds: Normal breath sounds. No stridor. No wheezing.  Chest:     Chest wall: No tenderness.  Abdominal:     General: There is no distension.     Palpations: Abdomen is soft.     Tenderness: There is no abdominal  tenderness.  Musculoskeletal:        General: No tenderness.     Right shoulder: Normal strength. Normal pulse.     Left shoulder: Bony tenderness (lateral clavicle) present. Normal strength. Normal pulse.     Cervical back: Normal range of motion and neck supple. No bony tenderness.     Thoracic back: No bony tenderness.     Lumbar back: No bony tenderness.     Comments: Clavicles stable. Chest stable to AP/Lat compression. Pelvis stable to Lat compression. No obvious extremity deformity. No chest or abdominal wall contusion.  Skin:    General: Skin is warm and dry.     Findings: No erythema or rash.  Neurological:     Mental Status: She is alert and oriented to person, place, and time.     Comments: Moving all extremities    ED Results and Treatments Labs (all labs ordered are listed, but only abnormal results are displayed) Labs Reviewed - No data to display                                                                                                                       EKG  EKG Interpretation  Date/Time:    Ventricular Rate:    PR Interval:    QRS Duration:   QT Interval:    QTC Calculation:   R Axis:     Text Interpretation:         Radiology DG Shoulder Left  Result Date: 02/27/2021 CLINICAL DATA:  Injury. EXAM: LEFT SHOULDER - 2+ VIEW COMPARISON:  None. FINDINGS: Half shaft with inferiorly displaced distal clavicular fracture that appears to be extra-articular. Question nondisplaced fracture of the humeral head. No definite findings of shoulder dislocation. No other acute displaced fracture identified. There is no evidence of arthropathy or other focal bone abnormality. Soft tissues are unremarkable. IMPRESSION: 1. Half shaft with inferiorly displaced distal clavicular fracture that appears to be extra-articular. 2. Question nondisplaced fracture of the humeral head. Electronically Signed   By: Iven Finn M.D.   On: 02/27/2021 22:30    Pertinent labs &  imaging results that were available during my care of the patient were reviewed by me and considered in my medical decision making (see MDM for details).  Medications Ordered in ED Medications  HYDROcodone-acetaminophen (NORCO/VICODIN) 5-325 MG per tablet 1 tablet (1 tablet Oral Given 02/28/21 0242)  Procedures Procedures  (including critical care time)  Medical Decision Making / ED Course I have reviewed the nursing notes for this encounter and the patient's prior records (if available in EHR or on provided paperwork).  Maryjean Morn was evaluated in Emergency Department on 02/28/2021 for the symptoms described in the history of present illness. She was evaluated in the context of the global COVID-19 pandemic, which necessitated consideration that the patient might be at risk for infection with the SARS-CoV-2 virus that causes COVID-19. Institutional protocols and algorithms that pertain to the evaluation of patients at risk for COVID-19 are in a state of rapid change based on information released by regulatory bodies including the CDC and federal and state organizations. These policies and algorithms were followed during the patient's care in the ED.     Mechanical fall resulting in left clavicle fracture. No other injuries noted on exam requiring imaging. Patient provided with a sling and oral pain medicine. Already followed by Dr. Alvan Dame from orthopedic surgery. Recommend she follow-up closely.  Imaging results that were available during my care of the patient were reviewed by me and considered in my medical decision making:    Final Clinical Impression(s) / ED Diagnoses Final diagnoses:  Fall in home, initial encounter  Closed displaced fracture of shaft of left clavicle, initial encounter   The patient appears reasonably screened and/or stabilized  for discharge and I doubt any other medical condition or other [facility] requiring further screening, evaluation, or treatment in the ED at this time prior to discharge. Safe for discharge with strict return precautions.  Disposition: Discharge  Condition: Good  I have discussed the results, Dx and Tx plan with the patient/family who expressed understanding and agree(s) with the plan. Discharge instructions discussed at length. The patient/family was given strict return precautions who verbalized understanding of the instructions. No further questions at time of discharge.    ED Discharge Orders          Ordered    acetaminophen (TYLENOL) 500 MG tablet  Every 8 hours        02/28/21 0239    oxyCODONE (ROXICODONE) 5 MG immediate release tablet  Every 6 hours PRN        02/28/21 0239             Follow Up: Paralee Cancel, MD [address] [place] [place] [address] [phone]  Call  to schedule an appointment for close follow up  Alroy Dust, L.Marlou Sa, [place] [employer] [place] [address] [phone]  Call  as needed     This chart was dictated using voice recognition software.  Despite best efforts to proofread,  errors can occur which can change the documentation meaning.    Fatima Blank, MD 02/28/21 [phone]

## 2021-02-28 NOTE — ED Notes (Signed)
Pt NAD, a/ox4. Pt verbalizes understanding of all DC and f/u instructions. All questions answered. Pt assisted into w/c, wheeled to parking lot and assisted into family members car.

## 2021-02-28 NOTE — ED Notes (Signed)
ED Provider at bedside. 

## 2021-02-28 NOTE — Discharge Instructions (Addendum)
For pain control you may take at 1000 mg of Tylenol every 8 hours scheduled.  In addition you can take 0.5 to 1 tablet of Oxycodone every 6 hours as needed for pain not controlled with the scheduled Tylenol. ? ?

## 2021-03-02 DIAGNOSIS — M25512 Pain in left shoulder: Secondary | ICD-10-CM | POA: Diagnosis not present

## 2021-03-08 DIAGNOSIS — M1711 Unilateral primary osteoarthritis, right knee: Secondary | ICD-10-CM | POA: Diagnosis not present

## 2021-03-16 DIAGNOSIS — S42002D Fracture of unspecified part of left clavicle, subsequent encounter for fracture with routine healing: Secondary | ICD-10-CM | POA: Diagnosis not present

## 2021-03-17 DIAGNOSIS — M810 Age-related osteoporosis without current pathological fracture: Secondary | ICD-10-CM | POA: Diagnosis not present

## 2021-03-29 DIAGNOSIS — Z23 Encounter for immunization: Secondary | ICD-10-CM | POA: Diagnosis not present

## 2021-06-14 DIAGNOSIS — M1711 Unilateral primary osteoarthritis, right knee: Secondary | ICD-10-CM | POA: Diagnosis not present

## 2021-07-04 NOTE — Progress Notes (Signed)
88 y.o. G52P2002 Married Caucasian female here for annual exam.    Has HSV outbreaks on her bottom.  Does not get them often.  Takes Acyclovir daily for prevention and wants to continue.  No bladder concerns.  States "help to keep me from falling."  She has broken her neck, ribs, and collar bone and tail bone.  States she does not have good balance.  She denies dizziness.  She uses a walker and a cane.   Takes Prolia through her PCP.  Her next BMD is due in December, 2023.   PCP:   L.Donnie Coffin  No LMP recorded. Patient has had a hysterectomy.           Sexually active: No.  The current method of family planning is status post hysterectomy.    Exercising: Yes.     walking Smoker:  no  Health Maintenance: Pap:  03-28-11 neg, History of abnormal Pap:  no MMG:  02-17-21 category c density birads 2:neg Colonoscopy:  12 yrs ago BMD:   05-11-20  Result  osteopenia.  She takes Prolia through her PCP.  TDaP:  2015 Gardasil:   no HIV: not done Hep C: not done Screening Labs:  PCP.   reports that she has quit smoking. She has never used smokeless tobacco. She reports that she does not drink alcohol and does not use drugs.  Past Medical History:  Diagnosis Date   Arthritis    Atrophic vaginitis    Broken collarbone    Broken neck (H[facility])    Broken ribs    Cystocele    Elevated serum creatinine    decreased GFR   Fibroid    Hypercholesteremia    Hypertension    Hypothyroidism    Osteoporosis    Paratubal cyst    Rectocele    STD (sexually transmitted disease)    HSV   Uterine prolapse     Past Surgical History:  Procedure Laterality Date   BROKEN ANKLE     BROKEN FEMUR     CATARACT EXTRACTION Bilateral    FOOT SURGERY Right    TOE   HERNIA REPAIR  2009   OOPHORECTOMY     BSO WITH VAG HYST IN 2004   REPLACEMENT TOTAL KNEE  2011   LEFT X 2   THYROIDECTOMY, PARTIAL     VAGINAL HYSTERECTOMY  2004   VAG HYST, BSO, A&P REPAIR    Current Outpatient  Medications  Medication Sig Dispense Refill   acetaminophen (TYLENOL) 650 MG CR tablet Take 650 mg by mouth every 8 (eight) hours as needed for pain.     acyclovir (ZOVIRAX) 400 MG tablet Take 1 tablet (400 mg total) by mouth daily. 90 tablet 3   amLODipine (NORVASC) 5 MG tablet Take 5 mg by mouth daily.     denosumab (PROLIA) 60 MG/ML SOLN injection Inject 60 mg into the skin every 6 (six) months. Administer in upper arm, thigh, or abdomen     Glucosamine HCl (GLUCOSAMINE PO) Take by mouth.     hydrochlorothiazide (HYDRODIURIL) 25 MG tablet Take 25 mg by mouth daily.     levothyroxine (SYNTHROID) 88 MCG tablet Take 88 mcg by mouth every morning.     lisinopril (ZESTRIL) 10 MG tablet Take 10 mg by mouth daily.     Multiple Vitamin (MULTIVITAMIN) capsule Take 1 capsule by mouth daily.     niacin 500 MG tablet Take 500 mg by mouth at bedtime.     Omega-3 Fatty Acids (  FISH OIL PO) Take by mouth.     simvastatin (ZOCOR) 40 MG tablet Take 40 mg by mouth at bedtime.     traMADol (ULTRAM) 50 MG tablet Take 1 tablet (50 mg total) by mouth every 6 (six) hours as needed. 15 tablet 0   No current facility-administered medications for this visit.    Family History  Problem Relation Age of Onset   Heart disease Mother    Hypertension Father    Hypertension Sister    Leukemia Sister    Heart disease Brother    Multiple sclerosis Brother    Parkinsonism Brother     Review of Systems  Constitutional: Negative.   HENT: Negative.    Eyes: Negative.   Respiratory: Negative.    Cardiovascular: Negative.   Gastrointestinal: Negative.   Endocrine: Negative.   Genitourinary: Negative.   Musculoskeletal: Negative.   Skin: Negative.   Allergic/Immunologic: Negative.   Neurological: Negative.   Hematological: Negative.   Psychiatric/Behavioral: Negative.     Exam:   BP 120/70    Pulse (!) 59    Resp 16    Ht 5' 1.25" (1.556 m)    Wt 156 lb (70.8 kg)    BMI 29.24 kg/m     General appearance:  alert, cooperative and appears stated age Head: normocephalic, without obvious abnormality, atraumatic Neck: no adenopathy, supple, symmetrical, trachea midline and thyroid normal to inspection and palpation Lungs: clear to auscultation bilaterally Breasts: normal appearance, no masses or tenderness, No nipple retraction or dimpling, No nipple discharge or bleeding, No axillary adenopathy Heart: regular rate and rhythm Abdomen: soft, non-tender; no masses, no organomegaly Extremities: extremities normal, atraumatic, no cyanosis or edema Skin: skin color, texture, turgor normal. No rashes or lesions Lymph nodes: cervical, supraclavicular, and axillary nodes normal. Neurologic: grossly normal  Pelvic: External genitalia:  no lesions              No abnormal inguinal nodes palpated.              Urethra:  normal appearing urethra with no masses, tenderness or lesions              Bartholins and Skenes: normal                 Vagina: atrophy, no lesions.               Cervix: absent              Pap taken: no Bimanual Exam:  Uterus:  normal size, contour, position, consistency, mobility, non-tender              Adnexa: no mass, fullness, tenderness              Rectal exam: Yes.  .  Confirms.              Anus:  decreased sphincter tone, no lesions  Chaperone was present for exam:  Joy, CMA  Assessment:   Well woman visit with gynecologic exam. GYN exam for high risk Medicare patient.  Status pot TVH/BSO and anterior colporrhaphy.  Hx HSV.   Medication monitoring encounter.  Elevated creatinine and decreased GFR.  Elevated calcium on chart review. Osteopenia.  Hx Prolia use.   Plan: Mammogram screening discussed. Self breast awareness reviewed. Pap and HR HPV as above. Guidelines for Calcium, Vitamin D, regular exercise program including cardiovascular and weight bearing exercise. Acyclovir 400 mg daily.  I recommended she check with her PCP regarding her  dosage.  She understand  she may need to reduce her daily dosage further.  We discussed that elevated levels of Acyclovir may may her feel dizzy.  BMD in December, 2023.  Follow up annually and prn.   After visit summary provided.   25 min  total time was spent for this patient encounter, including preparation, face-to-face counseling with the patient, coordination of care, and documentation of the encounter.

## 2021-07-05 ENCOUNTER — Encounter: Payer: Self-pay | Admitting: Obstetrics and Gynecology

## 2021-07-07 ENCOUNTER — Other Ambulatory Visit: Payer: Self-pay

## 2021-07-07 ENCOUNTER — Encounter: Payer: Self-pay | Admitting: Obstetrics and Gynecology

## 2021-07-07 ENCOUNTER — Ambulatory Visit (INDEPENDENT_AMBULATORY_CARE_PROVIDER_SITE_OTHER): Payer: Medicare Other | Admitting: Obstetrics and Gynecology

## 2021-07-07 VITALS — BP 120/70 | HR 59 | Resp 16 | Ht 61.25 in | Wt 156.0 lb

## 2021-07-07 DIAGNOSIS — Z9189 Other specified personal risk factors, not elsewhere classified: Secondary | ICD-10-CM | POA: Diagnosis not present

## 2021-07-07 DIAGNOSIS — Z01419 Encounter for gynecological examination (general) (routine) without abnormal findings: Secondary | ICD-10-CM | POA: Diagnosis not present

## 2021-07-07 DIAGNOSIS — Z5181 Encounter for therapeutic drug level monitoring: Secondary | ICD-10-CM | POA: Diagnosis not present

## 2021-07-07 DIAGNOSIS — Z8619 Personal history of other infectious and parasitic diseases: Secondary | ICD-10-CM

## 2021-07-07 DIAGNOSIS — M8580 Other specified disorders of bone density and structure, unspecified site: Secondary | ICD-10-CM | POA: Diagnosis not present

## 2021-07-07 DIAGNOSIS — Z9289 Personal history of other medical treatment: Secondary | ICD-10-CM | POA: Diagnosis not present

## 2021-07-07 MED ORDER — ACYCLOVIR 400 MG PO TABS: 400.0000 mg | ORAL_TABLET | Freq: Every day | ORAL | 3 refills | Status: DC

## 2021-07-07 NOTE — Patient Instructions (Signed)

## 2021-07-10 DIAGNOSIS — R7303 Prediabetes: Secondary | ICD-10-CM | POA: Diagnosis not present

## 2021-07-10 DIAGNOSIS — E669 Obesity, unspecified: Secondary | ICD-10-CM | POA: Diagnosis not present

## 2021-07-10 DIAGNOSIS — E782 Mixed hyperlipidemia: Secondary | ICD-10-CM | POA: Diagnosis not present

## 2021-07-10 DIAGNOSIS — E039 Hypothyroidism, unspecified: Secondary | ICD-10-CM | POA: Diagnosis not present

## 2021-07-10 DIAGNOSIS — M810 Age-related osteoporosis without current pathological fracture: Secondary | ICD-10-CM | POA: Diagnosis not present

## 2021-07-10 DIAGNOSIS — Z0000 Encounter for general adult medical examination without abnormal findings: Secondary | ICD-10-CM | POA: Diagnosis not present

## 2021-07-10 DIAGNOSIS — I10 Essential (primary) hypertension: Secondary | ICD-10-CM | POA: Diagnosis not present

## 2021-07-10 DIAGNOSIS — M150 Primary generalized (osteo)arthritis: Secondary | ICD-10-CM | POA: Diagnosis not present

## 2021-07-11 IMAGING — CT CT MAXILLOFACIAL W/O CM
3 series · 14 of 47 positions shown, 16 images · non-contrast
Comparison: None.

CLINICAL DATA: Fall, neck pain

EXAM:
CT HEAD WITHOUT CONTRAST
CT MAXILLOFACIAL WITHOUT CONTRAST
CT CERVICAL SPINE WITHOUT CONTRAST
TECHNIQUE: Multidetector CT imaging of the head, cervical spine, and
maxillofacial structures were performed using the standard protocol
without intravenous contrast. Multiplanar CT image reconstructions
of the cervical spine and maxillofacial structures were also
generated.

[Series 2: facialbone 2.0 st · axial · 0.39mm/px · z∈[+1242,+1382]mm · 8 of 82 slices shown, 10 images]
[im 6/82  brain]
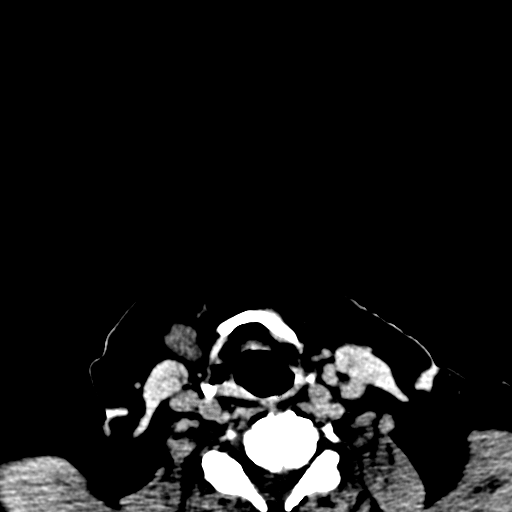
[im 6/82  bone]
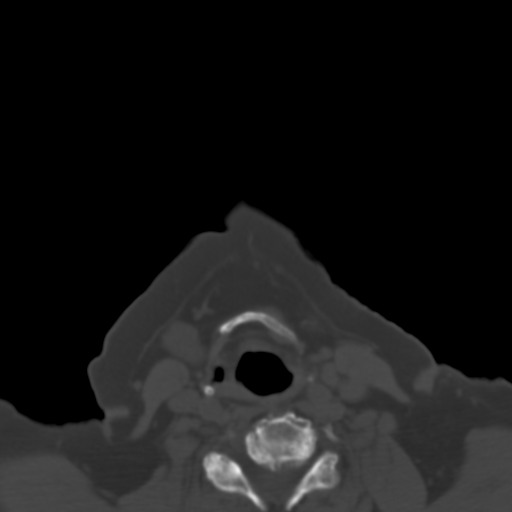
[im 17/82  bone]
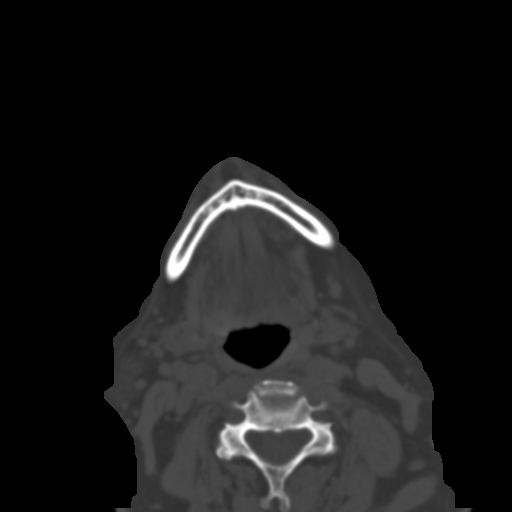
[im 26/82  bone]
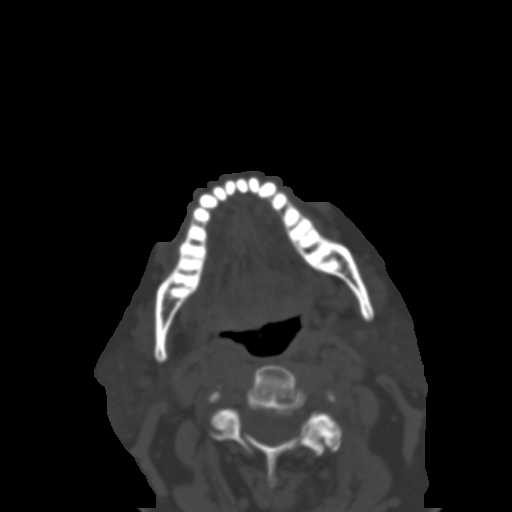
[im 37/82  bone]
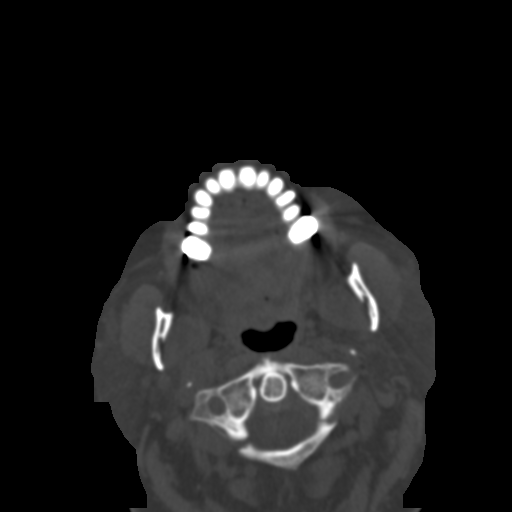
[im 45/82  brain]
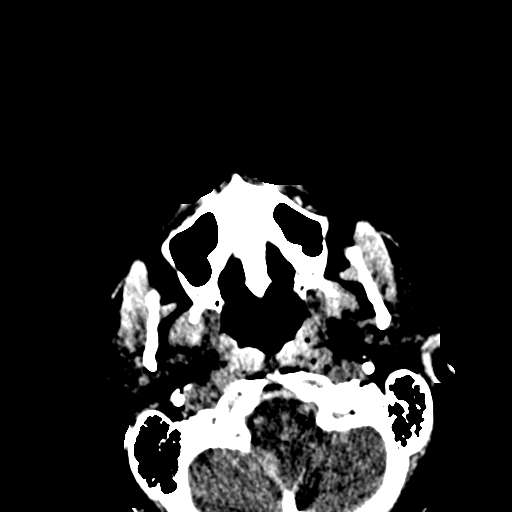
[im 45/82  bone]
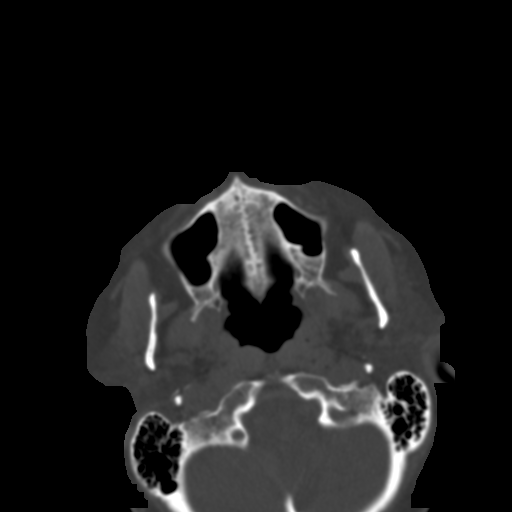
[im 56/82  bone]
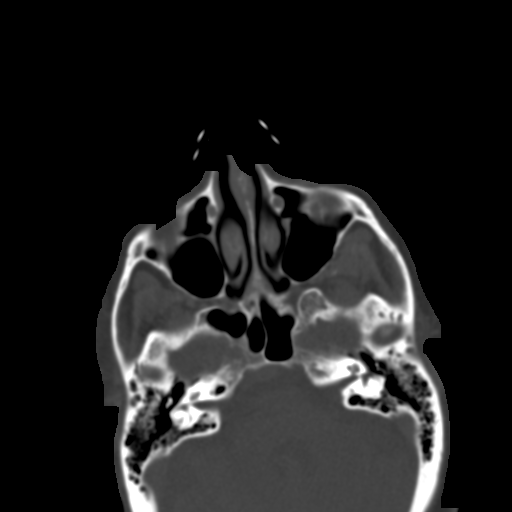
[im 65/82  bone]
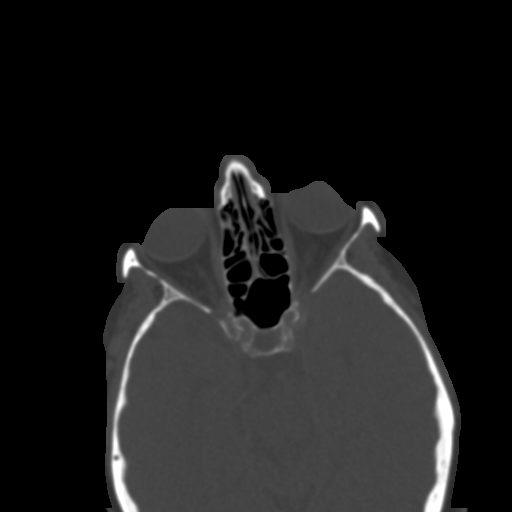
[im 76/82  bone]
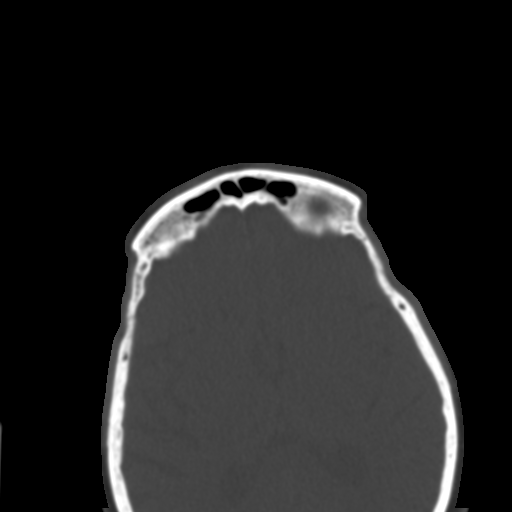

[Series 8: facialbone 2.0 cor st · coronal · 0.31mm/px · 3 of 71 slices shown]
[im 24/71  bone]
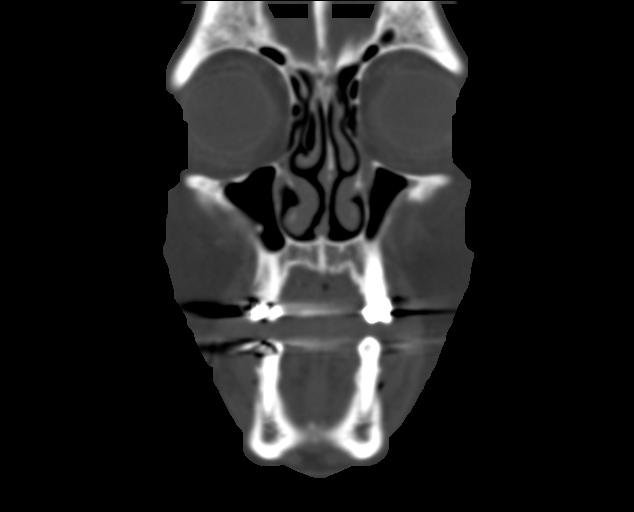
[im 32/71  bone]
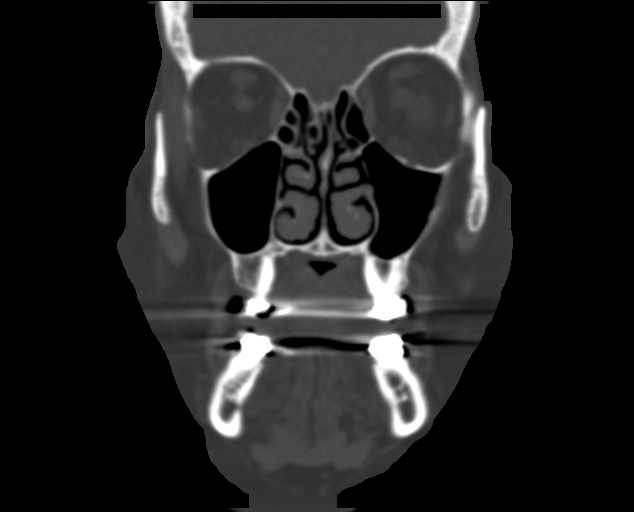
[im 39/71  bone]
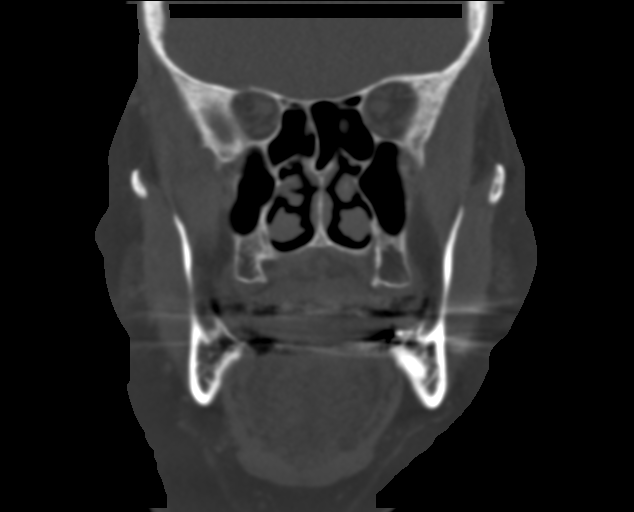

[Series 9: facialbone 2.0 sag st · sagittal · 0.27mm/px · 3 of 76 slices shown]
[im 26/76  bone]
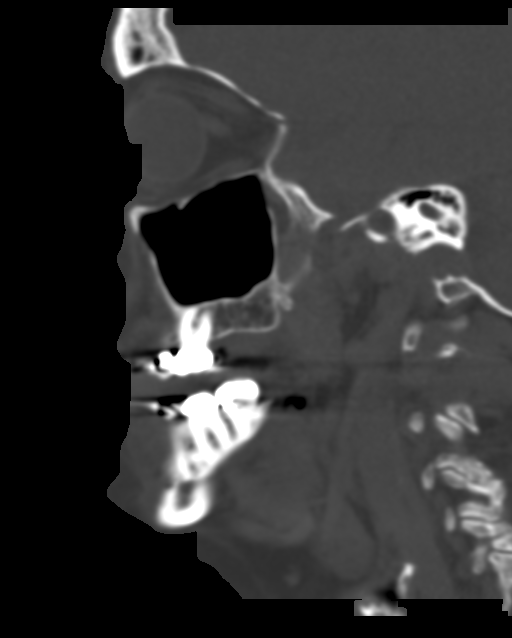
[im 38/76  bone]
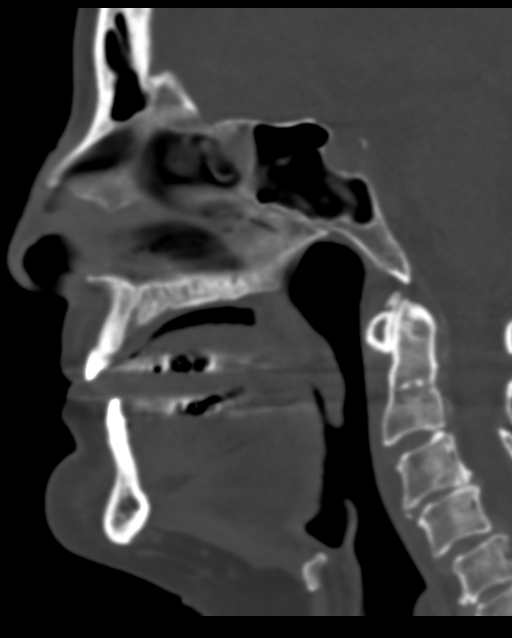
[im 51/76  bone]
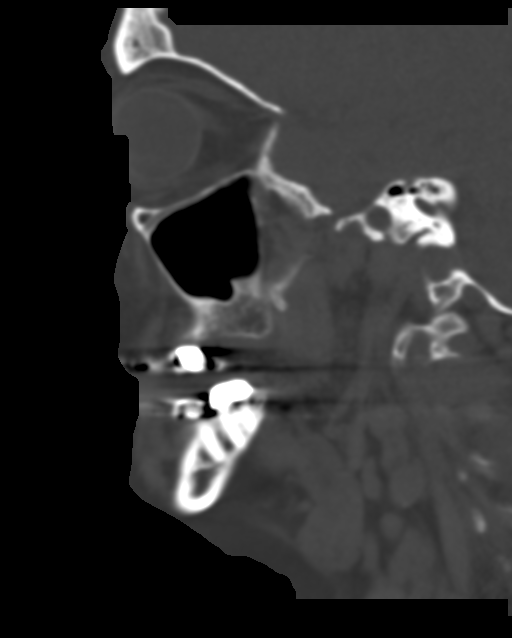

[14 of 47 positions shown; findings below may reference images not displayed]

FINDINGS: CT HEAD FINDINGS

Brain: No evidence of acute infarction, hemorrhage, hydrocephalus,
extra-axial collection or mass lesion/mass effect. Periventricular
and deep white matter hypodensity.

Vascular: No hyperdense vessel or unexpected calcification.

CT FACIAL BONES FINDINGS

Skull: Normal. Negative for fracture or focal lesion.

Facial bones: No displaced fractures or dislocations.

Sinuses/Orbits: No acute finding.

Other: None.

CT CERVICAL SPINE FINDINGS

Alignment: Normal.

Skull base and vertebrae: There is a mildly displaced posterior arch
fracture of C1 (series 3, image 37). The anterior elements of C1 are
intact. There is a mildly displaced fracture of the base of the dens
extending to involve the bilateral lateral masses and vertebral
foramina of C2, (series 3, image 32). No primary bone lesion or
focal pathologic process.

Soft tissues and spinal canal: No prevertebral fluid or swelling. No
visible canal hematoma.

Disc levels: Moderate multilevel disc space height loss and
osteophytosis.

Upper chest: Negative.

Other: There is a very large left thyroid goiter, incompletely
imaged and extending into the mediastinum, measuring at least 6 cm
(series 8, image 20, series 3, image 82)
IMPRESSION: 1. No acute intracranial pathology. Small-vessel white matter
disease.

2.  No displaced fracture or dislocation of the facial bones.

3. There is a mildly displaced posterior arch fracture of C1 (series
3, image 37). The anterior elements of C1 are intact.

4. There is a mildly displaced fracture of the base of the dens
extending to involve the bilateral lateral masses and vertebral
foramina of C2, (series 3, image 32).

These results were called by telephone to Dr. Merilyn at the time of
interpretation on 07/20/2019 at [DATE] p.m.

## 2021-08-23 DIAGNOSIS — L821 Other seborrheic keratosis: Secondary | ICD-10-CM | POA: Diagnosis not present

## 2021-08-23 DIAGNOSIS — D485 Neoplasm of uncertain behavior of skin: Secondary | ICD-10-CM | POA: Diagnosis not present

## 2021-08-23 DIAGNOSIS — Z85828 Personal history of other malignant neoplasm of skin: Secondary | ICD-10-CM | POA: Diagnosis not present

## 2021-08-23 DIAGNOSIS — C44311 Basal cell carcinoma of skin of nose: Secondary | ICD-10-CM | POA: Diagnosis not present

## 2021-08-23 DIAGNOSIS — D1801 Hemangioma of skin and subcutaneous tissue: Secondary | ICD-10-CM | POA: Diagnosis not present

## 2021-08-23 DIAGNOSIS — L814 Other melanin hyperpigmentation: Secondary | ICD-10-CM | POA: Diagnosis not present

## 2021-09-14 DIAGNOSIS — M25561 Pain in right knee: Secondary | ICD-10-CM | POA: Diagnosis not present

## 2021-09-14 DIAGNOSIS — M1711 Unilateral primary osteoarthritis, right knee: Secondary | ICD-10-CM | POA: Diagnosis not present

## 2021-09-18 DIAGNOSIS — M810 Age-related osteoporosis without current pathological fracture: Secondary | ICD-10-CM | POA: Diagnosis not present

## 2021-09-22 DIAGNOSIS — Z20822 Contact with and (suspected) exposure to covid-19: Secondary | ICD-10-CM | POA: Diagnosis not present

## 2021-09-29 DIAGNOSIS — Z20822 Contact with and (suspected) exposure to covid-19: Secondary | ICD-10-CM | POA: Diagnosis not present

## 2021-10-03 DIAGNOSIS — C44311 Basal cell carcinoma of skin of nose: Secondary | ICD-10-CM | POA: Diagnosis not present

## 2021-10-03 DIAGNOSIS — Z85828 Personal history of other malignant neoplasm of skin: Secondary | ICD-10-CM | POA: Diagnosis not present

## 2021-10-05 DIAGNOSIS — Z20822 Contact with and (suspected) exposure to covid-19: Secondary | ICD-10-CM | POA: Diagnosis not present

## 2021-10-12 DIAGNOSIS — Z20822 Contact with and (suspected) exposure to covid-19: Secondary | ICD-10-CM | POA: Diagnosis not present

## 2021-10-25 DIAGNOSIS — H02834 Dermatochalasis of left upper eyelid: Secondary | ICD-10-CM | POA: Diagnosis not present

## 2021-10-25 DIAGNOSIS — H02831 Dermatochalasis of right upper eyelid: Secondary | ICD-10-CM | POA: Diagnosis not present

## 2021-10-25 DIAGNOSIS — H43813 Vitreous degeneration, bilateral: Secondary | ICD-10-CM | POA: Diagnosis not present

## 2021-10-25 DIAGNOSIS — H04123 Dry eye syndrome of bilateral lacrimal glands: Secondary | ICD-10-CM | POA: Diagnosis not present

## 2021-11-29 DIAGNOSIS — M1711 Unilateral primary osteoarthritis, right knee: Secondary | ICD-10-CM | POA: Diagnosis not present

## 2021-11-29 DIAGNOSIS — M25561 Pain in right knee: Secondary | ICD-10-CM | POA: Diagnosis not present

## 2022-01-08 DIAGNOSIS — M150 Primary generalized (osteo)arthritis: Secondary | ICD-10-CM | POA: Diagnosis not present

## 2022-01-08 DIAGNOSIS — R7303 Prediabetes: Secondary | ICD-10-CM | POA: Diagnosis not present

## 2022-01-08 DIAGNOSIS — M810 Age-related osteoporosis without current pathological fracture: Secondary | ICD-10-CM | POA: Diagnosis not present

## 2022-01-08 DIAGNOSIS — E669 Obesity, unspecified: Secondary | ICD-10-CM | POA: Diagnosis not present

## 2022-01-08 DIAGNOSIS — E039 Hypothyroidism, unspecified: Secondary | ICD-10-CM | POA: Diagnosis not present

## 2022-01-08 DIAGNOSIS — E782 Mixed hyperlipidemia: Secondary | ICD-10-CM | POA: Diagnosis not present

## 2022-01-08 DIAGNOSIS — I10 Essential (primary) hypertension: Secondary | ICD-10-CM | POA: Diagnosis not present

## 2022-03-01 DIAGNOSIS — M1711 Unilateral primary osteoarthritis, right knee: Secondary | ICD-10-CM | POA: Diagnosis not present

## 2022-03-09 DIAGNOSIS — Z1231 Encounter for screening mammogram for malignant neoplasm of breast: Secondary | ICD-10-CM | POA: Diagnosis not present

## 2022-03-12 ENCOUNTER — Encounter: Payer: Self-pay | Admitting: Obstetrics and Gynecology

## 2022-03-14 DIAGNOSIS — Z23 Encounter for immunization: Secondary | ICD-10-CM | POA: Diagnosis not present

## 2022-03-21 DIAGNOSIS — M810 Age-related osteoporosis without current pathological fracture: Secondary | ICD-10-CM | POA: Diagnosis not present

## 2022-05-08 ENCOUNTER — Other Ambulatory Visit: Payer: Self-pay | Admitting: *Deleted

## 2022-05-08 DIAGNOSIS — Z780 Asymptomatic menopausal state: Secondary | ICD-10-CM

## 2022-05-08 DIAGNOSIS — M8580 Other specified disorders of bone density and structure, unspecified site: Secondary | ICD-10-CM

## 2022-05-15 ENCOUNTER — Ambulatory Visit (INDEPENDENT_AMBULATORY_CARE_PROVIDER_SITE_OTHER): Payer: Medicare Other

## 2022-05-15 ENCOUNTER — Other Ambulatory Visit: Payer: Self-pay | Admitting: Obstetrics & Gynecology

## 2022-05-15 DIAGNOSIS — Z780 Asymptomatic menopausal state: Secondary | ICD-10-CM

## 2022-05-15 DIAGNOSIS — M85832 Other specified disorders of bone density and structure, left forearm: Secondary | ICD-10-CM

## 2022-05-15 DIAGNOSIS — Z13820 Encounter for screening for osteoporosis: Secondary | ICD-10-CM

## 2022-05-15 DIAGNOSIS — M8580 Other specified disorders of bone density and structure, unspecified site: Secondary | ICD-10-CM

## 2022-05-30 DIAGNOSIS — M1711 Unilateral primary osteoarthritis, right knee: Secondary | ICD-10-CM | POA: Diagnosis not present

## 2022-06-13 DIAGNOSIS — U071 COVID-19: Secondary | ICD-10-CM | POA: Diagnosis not present

## 2022-06-13 DIAGNOSIS — R0982 Postnasal drip: Secondary | ICD-10-CM | POA: Diagnosis not present

## 2022-06-13 DIAGNOSIS — Z20822 Contact with and (suspected) exposure to covid-19: Secondary | ICD-10-CM | POA: Diagnosis not present

## 2022-06-13 DIAGNOSIS — R059 Cough, unspecified: Secondary | ICD-10-CM | POA: Diagnosis not present

## 2022-06-25 NOTE — Progress Notes (Signed)
89 y.o. G26P2002 Married Caucasian female here for annual exam.    Hx HSV.  Takes Acyclovir daily for HSV prevention.  She wants to continue.   Does have falls. Uses a walker at home and a cane when she goes out.  She has her cane with her today.  On Prolia for her bone health.   PCP:   Donnie Coffin, MD  No LMP recorded. Patient has had a hysterectomy.           Sexually active: No.  The current method of family planning is status post hysterectomy.    Exercising: No.     Smoker:  no  Health Maintenance: Pap:  03/28/11 neg History of abnormal Pap:  no MMG:  03/12/22 Breast Density Category C, BI-RADS CATEGORY 1 Neg Colonoscopy:  12 yrs ago BMD:   12/23  Result  osteopenia of forearm and normal hip bone density, done at this office.  She takes Prolia through her PCP.  TDaP:  2015 Gardasil:   no HIV: n/a Hep C: n/a Screening Labs:  PCP   reports that she has quit smoking. She has never used smokeless tobacco. She reports that she does not drink alcohol and does not use drugs.  Past Medical History:  Diagnosis Date   Arthritis    Atrophic vaginitis    Broken collarbone    Broken neck (HCC)    Broken ribs    Cystocele    Elevated serum creatinine    decreased GFR   Fibroid    Hypercholesteremia    Hypertension    Hypothyroidism    Osteoporosis    Paratubal cyst    Rectocele    STD (sexually transmitted disease)    HSV   Uterine prolapse     Past Surgical History:  Procedure Laterality Date   BROKEN ANKLE     BROKEN FEMUR     CATARACT EXTRACTION Bilateral    FOOT SURGERY Right    TOE   HERNIA REPAIR  2009   OOPHORECTOMY     BSO WITH VAG HYST IN 2004   REPLACEMENT TOTAL KNEE  2011   LEFT X 2   THYROIDECTOMY, PARTIAL     VAGINAL HYSTERECTOMY  2004   VAG HYST, BSO, A&P REPAIR    Current Outpatient Medications  Medication Sig Dispense Refill   acetaminophen (TYLENOL) 650 MG CR tablet Take 650 mg by mouth every 8 (eight) hours as needed for pain.      acyclovir (ZOVIRAX) 400 MG tablet Take 1 tablet (400 mg total) by mouth daily. 90 tablet 3   amLODipine (NORVASC) 5 MG tablet Take 5 mg by mouth daily.     denosumab (PROLIA) 60 MG/ML SOLN injection Inject 60 mg into the skin every 6 (six) months. Administer in upper arm, thigh, or abdomen     Glucosamine HCl (GLUCOSAMINE PO) Take by mouth.     hydrochlorothiazide (HYDRODIURIL) 25 MG tablet Take 25 mg by mouth daily.     levothyroxine (SYNTHROID) 88 MCG tablet Take 88 mcg by mouth every morning.     lisinopril (ZESTRIL) 10 MG tablet Take 10 mg by mouth daily.     Multiple Vitamin (MULTIVITAMIN) capsule Take 1 capsule by mouth daily.     niacin 500 MG tablet Take 500 mg by mouth at bedtime.     Omega-3 Fatty Acids (FISH OIL PO) Take by mouth.     simvastatin (ZOCOR) 40 MG tablet Take 40 mg by mouth at bedtime.  traMADol (ULTRAM) 50 MG tablet Take 1 tablet (50 mg total) by mouth every 6 (six) hours as needed. 15 tablet 0   No current facility-administered medications for this visit.    Family History  Problem Relation Age of Onset   Heart disease Mother    Hypertension Father    Hypertension Sister    Leukemia Sister    Heart disease Brother    Multiple sclerosis Brother    Parkinsonism Brother     Review of Systems  All other systems reviewed and are negative.   Exam:   BP 110/64 (BP Location: Left Arm, Patient Position: Sitting, Cuff Size: Normal)   Pulse 74   Ht '5\' 3"'$  (1.6 m)   Wt 161 lb (73 kg)   SpO2 98%   BMI 28.52 kg/m     General appearance: alert, cooperative and appears stated age Head: normocephalic, without obvious abnormality, atraumatic Neck: no adenopathy, supple, symmetrical, trachea midline and thyroid normal to inspection and palpation Lungs: clear to auscultation bilaterally Breasts: normal appearance, no masses or tenderness, No nipple retraction or dimpling, No nipple discharge or bleeding, No axillary adenopathy Heart: regular rate and  rhythm Abdomen: soft, non-tender; no masses, no organomegaly Extremities: extremities normal, atraumatic, no cyanosis or edema Skin: skin color, texture, turgor normal. No rashes or lesions Lymph nodes: cervical, supraclavicular, and axillary nodes normal. Neurologic: grossly normal  Pelvic: External genitalia:  no lesions              No abnormal inguinal nodes palpated.              Urethra:  normal appearing urethra with no masses, tenderness or lesions              Bartholins and Skenes: normal                 Vagina: normal appearing vagina with normal color and discharge, no lesions              Cervix: absent              Pap taken:no Bimanual Exam:  Uterus: absent              Adnexa: no mass, fullness, tenderness              Rectal exam: yes.  Confirms.              Anus:  normal sphincter tone, no lesions  Chaperone was present for exam:  Emily  Assessment:   Well woman visit with gynecologic exam. GYN exam for high risk Medicare patient.  Status pot TVH/BSO and anterior colporrhaphy.  Hx HSV.   Medication monitoring encounter.  Elevated creatinine and decreased GFR.  Osteopenia of the forearm.  On Prolia through PCP.   Plan: Mammogram screening discussed. Self breast awareness reviewed. Pap and HR HPV not indicated. Guidelines for Calcium, Vitamin D, regular exercise program including cardiovascular and weight bearing exercise. Will check BMP with GFR.  Refill of Acyclovir 400 mg daily for prevention.  Follow up annually and prn.   After visit summary provided.   Patient was helped off the exam table with 2 persons.  Wheel chair escort for patient to go to the lab and then escort her to her car.  25 min  total time was spent for this patient encounter, including preparation, face-to-face counseling with the patient, coordination of care, and documentation of the encounter.

## 2022-07-09 ENCOUNTER — Encounter: Payer: Self-pay | Admitting: Obstetrics and Gynecology

## 2022-07-09 ENCOUNTER — Ambulatory Visit (INDEPENDENT_AMBULATORY_CARE_PROVIDER_SITE_OTHER): Payer: Medicare Other | Admitting: Obstetrics and Gynecology

## 2022-07-09 VITALS — BP 110/64 | HR 74 | Ht 63.0 in | Wt 161.0 lb

## 2022-07-09 DIAGNOSIS — B009 Herpesviral infection, unspecified: Secondary | ICD-10-CM | POA: Diagnosis not present

## 2022-07-09 DIAGNOSIS — Z9189 Other specified personal risk factors, not elsewhere classified: Secondary | ICD-10-CM

## 2022-07-09 DIAGNOSIS — Z01419 Encounter for gynecological examination (general) (routine) without abnormal findings: Secondary | ICD-10-CM

## 2022-07-09 DIAGNOSIS — Z5181 Encounter for therapeutic drug level monitoring: Secondary | ICD-10-CM | POA: Diagnosis not present

## 2022-07-09 MED ORDER — ACYCLOVIR 400 MG PO TABS: 400.0000 mg | ORAL_TABLET | Freq: Every day | ORAL | 3 refills | Status: DC

## 2022-07-09 NOTE — Patient Instructions (Signed)
Calcium Content in Foods Calcium is the most abundant mineral in the body. Most of the body's calcium supply is stored in bones and teeth. Calcium helps many parts of the body function normally, including: Blood and blood vessels. Nerves. Hormones. Muscles. Bones and teeth. When your calcium stores are low, you may be at risk for low bone mass, bone loss, and broken bones (fractures). When you get enough calcium, it helps to support strong bones and teeth throughout your life. Calcium is especially important for: Children during growth spurts. Girls during adolescence. Women who are pregnant or breastfeeding. Women after their menstrual cycle stops (postmenopause). Women whose menstrual cycle has stopped due to anorexia nervosa or regular intense exercise. People who cannot eat or digest dairy products. Vegans. Recommended daily amounts of calcium: Women (ages 73 to 44): 1,000 mg per day. Women (ages 44 and older): 1,200 mg per day. Men (ages 35 to 74): 1,000 mg per day. Men (ages 87 and older): 1,200 mg per day. Women (ages 29 to 79): 1,300 mg per day. Men (ages 4 to 11): 1,300 mg per day. General information Eat foods that are high in calcium. Try to get most of your calcium from food. Some people may benefit from taking calcium supplements. Check with your health care provider or diet and nutrition specialist (dietitian) before starting any calcium supplements. Calcium supplements may interact with certain medicines. Too much calcium may cause other health problems, such as constipation and kidney stones. For the body to absorb calcium, it needs vitamin D. Sources of vitamin D include: Skin exposure to direct sunlight. Foods, such as egg yolks, liver, mushrooms, saltwater fish, and fortified milk. Vitamin D supplements. Check with your health care provider or dietitian before starting any vitamin D supplements. What foods are high in calcium?  Foods that are high in calcium contain  more than 100 milligrams per serving. Fruits Fortified orange juice or other fruit juice, 300 mg per 8 oz serving. Vegetables Collard greens, 360 mg per 8 oz serving. Kale, 100 mg per 8 oz serving. Bok choy, 160 mg per 8 oz serving. Grains Fortified ready-to-eat cereals, 100 to 1,000 mg per 8 oz serving. Fortified frozen waffles, 200 mg in 2 waffles. Oatmeal, 140 mg in 1 cup. Meats and other proteins Sardines, canned with bones, 325 mg per 3 oz serving. Salmon, canned with bones, 180 mg per 3 oz serving. Canned shrimp, 125 mg per 3 oz serving. Baked beans, 160 mg per 4 oz serving. Tofu, firm, made with calcium sulfate, 253 mg per 4 oz serving. Dairy Yogurt, plain, low-fat, 310 mg per 6 oz serving. Nonfat milk, 300 mg per 8 oz serving. American cheese, 195 mg per 1 oz serving. Cheddar cheese, 205 mg per 1 oz serving. Cottage cheese 2%, 105 mg per 4 oz serving. Fortified soy, rice, or almond milk, 300 mg per 8 oz serving. Mozzarella, part skim, 210 mg per 1 oz serving. The items listed above may not be a complete list of foods high in calcium. Actual amounts of calcium may be different depending on processing. Contact a dietitian for more information. What foods are lower in calcium? Foods that are lower in calcium contain 50 mg or less per serving. Fruits Apple, about 6 mg. Banana, about 12 mg. Vegetables Lettuce, 19 mg per 2 oz serving. Tomato, about 11 mg. Grains Rice, 4 mg per 6 oz serving. Boiled potatoes, 14 mg per 8 oz serving. White bread, 6 mg per slice. Meats and other proteins  Egg, 27 mg per 2 oz serving. Red meat, 7 mg per 4 oz serving. Chicken, 17 mg per 4 oz serving. Fish, cod, or trout, 20 mg per 4 oz serving. Dairy Cream cheese, regular, 14 mg per 1 Tbsp serving. Brie cheese, 50 mg per 1 oz serving. Parmesan cheese, 70 mg per 1 Tbsp serving. The items listed above may not be a complete list of foods lower in calcium. Actual amounts of calcium may be  different depending on processing. Contact a dietitian for more information. Summary Calcium is an important mineral in the body because it affects many functions. Getting enough calcium helps support strong bones and teeth throughout your life. Try to get most of your calcium from food. Calcium supplements may interact with certain medicines. Check with your health care provider or dietitian before starting any calcium supplements. This information is not intended to replace advice given to you by your health care provider. Make sure you discuss any questions you have with your health care provider. Document Revised: 09/16/2019 Document Reviewed: 09/16/2019 Elsevier Patient Education  [place].

## 2022-07-10 LAB — BASIC METABOLIC PANEL WITH GFR
BUN/Creatinine Ratio: 26 (calc) — ABNORMAL HIGH (ref 6–22)
BUN: 27 mg/dL — ABNORMAL HIGH (ref 7–25)
CO2: 18 mmol/L — ABNORMAL LOW (ref 20–32)
Calcium: 10.2 mg/dL (ref 8.6–10.4)
Chloride: 107 mmol/L (ref 98–110)
Creat: 1.05 mg/dL — ABNORMAL HIGH (ref 0.60–0.95)
Glucose, Bld: 79 mg/dL (ref 65–99)
Potassium: 4.6 mmol/L (ref 3.5–5.3)
Sodium: 140 mmol/L (ref 135–146)
eGFR: 51 mL/min/{1.73_m2} — ABNORMAL LOW (ref >=60)

## 2022-07-11 DIAGNOSIS — M810 Age-related osteoporosis without current pathological fracture: Secondary | ICD-10-CM | POA: Diagnosis not present

## 2022-07-11 DIAGNOSIS — E782 Mixed hyperlipidemia: Secondary | ICD-10-CM | POA: Diagnosis not present

## 2022-07-11 DIAGNOSIS — E669 Obesity, unspecified: Secondary | ICD-10-CM | POA: Diagnosis not present

## 2022-07-11 DIAGNOSIS — I10 Essential (primary) hypertension: Secondary | ICD-10-CM | POA: Diagnosis not present

## 2022-07-11 DIAGNOSIS — M150 Primary generalized (osteo)arthritis: Secondary | ICD-10-CM | POA: Diagnosis not present

## 2022-07-11 DIAGNOSIS — R7303 Prediabetes: Secondary | ICD-10-CM | POA: Diagnosis not present

## 2022-07-11 DIAGNOSIS — E039 Hypothyroidism, unspecified: Secondary | ICD-10-CM | POA: Diagnosis not present

## 2022-07-11 DIAGNOSIS — Z0000 Encounter for general adult medical examination without abnormal findings: Secondary | ICD-10-CM | POA: Diagnosis not present

## 2022-08-29 DIAGNOSIS — M1711 Unilateral primary osteoarthritis, right knee: Secondary | ICD-10-CM | POA: Diagnosis not present

## 2022-09-03 DIAGNOSIS — D1801 Hemangioma of skin and subcutaneous tissue: Secondary | ICD-10-CM | POA: Diagnosis not present

## 2022-09-03 DIAGNOSIS — D225 Melanocytic nevi of trunk: Secondary | ICD-10-CM | POA: Diagnosis not present

## 2022-09-03 DIAGNOSIS — L304 Erythema intertrigo: Secondary | ICD-10-CM | POA: Diagnosis not present

## 2022-09-03 DIAGNOSIS — L821 Other seborrheic keratosis: Secondary | ICD-10-CM | POA: Diagnosis not present

## 2022-09-03 DIAGNOSIS — Z85828 Personal history of other malignant neoplasm of skin: Secondary | ICD-10-CM | POA: Diagnosis not present

## 2022-09-03 DIAGNOSIS — L814 Other melanin hyperpigmentation: Secondary | ICD-10-CM | POA: Diagnosis not present

## 2022-09-21 DIAGNOSIS — M810 Age-related osteoporosis without current pathological fracture: Secondary | ICD-10-CM | POA: Diagnosis not present

## 2022-10-31 DIAGNOSIS — Z961 Presence of intraocular lens: Secondary | ICD-10-CM | POA: Diagnosis not present

## 2022-10-31 DIAGNOSIS — H04123 Dry eye syndrome of bilateral lacrimal glands: Secondary | ICD-10-CM | POA: Diagnosis not present

## 2022-10-31 DIAGNOSIS — H43813 Vitreous degeneration, bilateral: Secondary | ICD-10-CM | POA: Diagnosis not present

## 2022-10-31 DIAGNOSIS — H18593 Other hereditary corneal dystrophies, bilateral: Secondary | ICD-10-CM | POA: Diagnosis not present

## 2022-12-03 DIAGNOSIS — M170 Bilateral primary osteoarthritis of knee: Secondary | ICD-10-CM | POA: Diagnosis not present

## 2022-12-27 DIAGNOSIS — W19XXXA Unspecified fall, initial encounter: Secondary | ICD-10-CM | POA: Diagnosis not present

## 2022-12-27 DIAGNOSIS — R0781 Pleurodynia: Secondary | ICD-10-CM | POA: Diagnosis not present

## 2023-01-09 DIAGNOSIS — E782 Mixed hyperlipidemia: Secondary | ICD-10-CM | POA: Diagnosis not present

## 2023-01-09 DIAGNOSIS — I10 Essential (primary) hypertension: Secondary | ICD-10-CM | POA: Diagnosis not present

## 2023-01-09 DIAGNOSIS — E669 Obesity, unspecified: Secondary | ICD-10-CM | POA: Diagnosis not present

## 2023-01-09 DIAGNOSIS — M810 Age-related osteoporosis without current pathological fracture: Secondary | ICD-10-CM | POA: Diagnosis not present

## 2023-01-09 DIAGNOSIS — E039 Hypothyroidism, unspecified: Secondary | ICD-10-CM | POA: Diagnosis not present

## 2023-01-09 DIAGNOSIS — R7303 Prediabetes: Secondary | ICD-10-CM | POA: Diagnosis not present

## 2023-01-09 DIAGNOSIS — M150 Primary generalized (osteo)arthritis: Secondary | ICD-10-CM | POA: Diagnosis not present

## 2023-01-31 DIAGNOSIS — M1711 Unilateral primary osteoarthritis, right knee: Secondary | ICD-10-CM | POA: Diagnosis not present

## 2023-02-05 DIAGNOSIS — Z23 Encounter for immunization: Secondary | ICD-10-CM | POA: Diagnosis not present

## 2023-02-07 DIAGNOSIS — M1711 Unilateral primary osteoarthritis, right knee: Secondary | ICD-10-CM | POA: Diagnosis not present

## 2023-02-14 DIAGNOSIS — M1711 Unilateral primary osteoarthritis, right knee: Secondary | ICD-10-CM | POA: Diagnosis not present

## 2023-03-27 DIAGNOSIS — M818 Other osteoporosis without current pathological fracture: Secondary | ICD-10-CM | POA: Diagnosis not present

## 2023-04-02 DIAGNOSIS — Z1231 Encounter for screening mammogram for malignant neoplasm of breast: Secondary | ICD-10-CM | POA: Diagnosis not present

## 2023-07-12 DIAGNOSIS — N1831 Chronic kidney disease, stage 3a: Secondary | ICD-10-CM | POA: Diagnosis not present

## 2023-07-12 DIAGNOSIS — E039 Hypothyroidism, unspecified: Secondary | ICD-10-CM | POA: Diagnosis not present

## 2023-07-12 DIAGNOSIS — M810 Age-related osteoporosis without current pathological fracture: Secondary | ICD-10-CM | POA: Diagnosis not present

## 2023-07-12 DIAGNOSIS — E782 Mixed hyperlipidemia: Secondary | ICD-10-CM | POA: Diagnosis not present

## 2023-07-12 DIAGNOSIS — R7303 Prediabetes: Secondary | ICD-10-CM | POA: Diagnosis not present

## 2023-07-12 DIAGNOSIS — I10 Essential (primary) hypertension: Secondary | ICD-10-CM | POA: Diagnosis not present

## 2023-08-13 ENCOUNTER — Other Ambulatory Visit: Payer: Self-pay

## 2023-08-13 DIAGNOSIS — N1831 Chronic kidney disease, stage 3a: Secondary | ICD-10-CM | POA: Diagnosis not present

## 2023-08-13 DIAGNOSIS — B009 Herpesviral infection, unspecified: Secondary | ICD-10-CM

## 2023-08-13 DIAGNOSIS — I10 Essential (primary) hypertension: Secondary | ICD-10-CM | POA: Diagnosis not present

## 2023-08-13 MED ORDER — ACYCLOVIR 400 MG PO TABS: 400.0000 mg | ORAL_TABLET | Freq: Every day | ORAL | 0 refills | Status: DC

## 2023-08-13 NOTE — Telephone Encounter (Signed)
 Med refill request: acyclovir 400 mg Last AEX: 07/09/22 Next AEX: 09/05/23 Last MMG (if hormonal med) n/a Refill authorized: acyclovir 400 mg #90 with zero refills.  Please approve or deny as appropriate.

## 2023-08-19 ENCOUNTER — Other Ambulatory Visit: Payer: Self-pay

## 2023-08-19 DIAGNOSIS — B009 Herpesviral infection, unspecified: Secondary | ICD-10-CM

## 2023-08-19 NOTE — Telephone Encounter (Signed)
 Med refill request: Acyclovir Last AEX: 07/09/22 Next AEX: 09/05/23 Last MMG (if hormonal med) n/a Refill authorized: 08/13/23 #30 with 0 refills. Rx was sent to local pharmacy, request is now for [facility] for 90 day supply. Please approve or deny

## 2023-08-22 ENCOUNTER — Encounter: Payer: Self-pay | Admitting: Obstetrics and Gynecology

## 2023-08-22 NOTE — Progress Notes (Signed)
 89 y.o. G36P2002 Married Caucasian female here for a breast and pelvic exam.    The patient is also followed for HSV and takes Acyclovir for prevention. She takes Acyclovir daily.   Voids often.  Takes hydrochlorothiazide.  No urinary incontinence.   Cr 1.05 and e GFR 51 on 07/09/22.   Needs a right knee replacement.   Cares for her husband who had a fall.  She has some challenges in coming to this office for appointments.   PCP: Clovis Riley, L.August Saucer, MD (Inactive)   No LMP recorded. Patient has had a hysterectomy.           Sexually active: No.  The current method of family planning is status post hysterectomy.    Menopausal hormone therapy:  n/a Exercising: Yes.     walking Smoker:  former  OB History     Gravida  2   Para  2   Term  2   Preterm      AB      Living  2      SAB      IAB      Ectopic      Multiple      Live Births              HEALTH MAINTENANCE: Last 2 paps: 03/28/11 History of abnormal Pap or positive HPV:  no Mammogram:  03/12/22 Breast Density Cat C, BI-RADS CAT 1 neg Colonoscopy:  13 years ago Bone Density:  05/15/22  Result  osteopenia   Immunization History  Administered Date(s) Administered   DTaP / Hep B / IPV 10/04/2016, 04/17/2017, 11/12/2017, 05/15/2018, 12/01/2018, 08/26/2019, 02/29/2020, 08/29/2020, 03/17/2021   Fluad Quad(high Dose 65+) 02/03/2019   Influenza Split 02/19/2008, 03/05/2011, 02/18/2012, 02/02/2014, 02/21/2017, 01/21/2018, 03/05/2019, 02/09/2020   Influenza, High Dose Seasonal PF 02/21/2017   Influenza,inj,Quad PF,6+ Mos 02/22/2014   Influenza,inj,quad, With Preservative 01/02/2018, 02/03/2019   Influenza-Unspecified 03/22/2015, 03/26/2016   PFIZER(Purple Top)SARS-COV-2 Vaccination 06/25/2019, 07/16/2019, 03/16/2020, 09/19/2020   Pneumococcal Conjugate-13 03/18/2014   Pneumococcal Polysaccharide-23 01/19/1999, 08/25/2009   Td 08/03/2003   Tdap 09/01/2013   Zoster Recombinant(Shingrix) 05/15/2018,  07/17/2018   Zoster, Live 02/19/2008, 05/15/2018, 07/17/2018      reports that she has quit smoking. She has never used smokeless tobacco. She reports that she does not drink alcohol and does not use drugs.  Past Medical History:  Diagnosis Date   Arthritis    Atrophic vaginitis    Broken collarbone    Broken neck (HCC)    Broken ribs    Cystocele    Elevated serum creatinine    decreased GFR   Fibroid    Hypercholesteremia    Hypertension    Hypothyroidism    Osteoporosis    Paratubal cyst    Rectocele    STD (sexually transmitted disease)    HSV   Uterine prolapse     Past Surgical History:  Procedure Laterality Date   BROKEN ANKLE     BROKEN FEMUR     CATARACT EXTRACTION Bilateral    FOOT SURGERY Right    TOE   HERNIA REPAIR  2009   OOPHORECTOMY     BSO WITH VAG HYST IN 2004   REPLACEMENT TOTAL KNEE  2011   LEFT X 2   THYROIDECTOMY, PARTIAL     VAGINAL HYSTERECTOMY  2004   VAG HYST, BSO, A&P REPAIR    Current Outpatient Medications  Medication Sig Dispense Refill   acetaminophen (TYLENOL) 650 MG CR tablet  Take 650 mg by mouth every 8 (eight) hours as needed for pain.     acyclovir (ZOVIRAX) 400 MG tablet Take 1 tablet (400 mg total) by mouth daily. 90 tablet 3   amLODipine (NORVASC) 5 MG tablet Take 5 mg by mouth daily.     denosumab (PROLIA) 60 MG/ML SOLN injection Inject 60 mg into the skin every 6 (six) months. Administer in upper arm, thigh, or abdomen     Glucosamine HCl (GLUCOSAMINE PO) Take by mouth.     hydrochlorothiazide (HYDRODIURIL) 25 MG tablet Take 25 mg by mouth daily.     levothyroxine (SYNTHROID) 88 MCG tablet Take 88 mcg by mouth every morning.     lisinopril (ZESTRIL) 10 MG tablet Take 10 mg by mouth daily.     Multiple Vitamin (MULTIVITAMIN) capsule Take 1 capsule by mouth daily.     niacin 500 MG tablet Take 500 mg by mouth at bedtime.     Omega-3 Fatty Acids (FISH OIL PO) Take by mouth.     simvastatin (ZOCOR) 40 MG tablet Take 40 mg  by mouth at bedtime.     traMADol (ULTRAM) 50 MG tablet Take 1 tablet (50 mg total) by mouth every 6 (six) hours as needed. 15 tablet 0   No current facility-administered medications for this visit.    ALLERGIES: Patient has no known allergies.  Family History  Problem Relation Age of Onset   Heart disease Mother    Hypertension Father    Hypertension Sister    Leukemia Sister    Heart disease Brother    Multiple sclerosis Brother    Parkinsonism Brother     Review of Systems  All other systems reviewed and are negative.   PHYSICAL EXAM:  BP 118/76 (BP Location: Left Arm, Patient Position: Sitting, Cuff Size: Small)   Pulse 76   Ht 5\' 2"  (1.575 m)   Wt 150 lb (68 kg)   SpO2 97%   BMI 27.44 kg/m     General appearance: alert, cooperative and appears stated age Head: normocephalic, without obvious abnormality, atraumatic Neck: no adenopathy, supple, symmetrical, trachea midline and thyroid normal to inspection and palpation Lungs: clear to auscultation bilaterally Breasts: normal appearance, no masses or tenderness, No nipple retraction or dimpling, No nipple discharge or bleeding, No axillary adenopathy Heart:  subtle systolic murmur noted today. Abdomen: soft, non-tender; no masses, no organomegaly Extremities: extremities normal, atraumatic, no cyanosis or edema Skin: skin color, texture, turgor normal. No rashes or lesions Lymph nodes: cervical, supraclavicular, and axillary nodes normal. Neurologic: grossly normal  Pelvic: External genitalia:  no lesions              No abnormal inguinal nodes palpated.              Urethra:  normal appearing urethra with no masses, tenderness or lesions              Bartholins and Skenes: normal                 Vagina: normal appearing vagina with normal color and discharge, no lesions              Cervix: absent              Pap taken: no Bimanual Exam:  Uterus:  absent              Adnexa: no mass, fullness, tenderness  Rectal exam: yes.  Confirms.              Anus:  normal sphincter tone, no lesions  Chaperone was present for exam:  Warren Lacy, CMA  ASSESSMENT: Encounter for breast and pelvic exam. Personal history of other medical treatment.  Status pot TVH/BSO and anterior colporrhaphy.  Hx HSV.   Medication monitoring encounter.  Elevated creatinine and decreased GFR.  Osteopenia of the forearm.  On Prolia through PCP.  Potential heart murmur.   PLAN: Mammogram screening discussed.  Will get a copy of mammogram from [facility] for 2024.  Self breast awareness reviewed. Pap and HRV collected:  no.  Not indicated.  Guidelines for Calcium, Vitamin D, regular exercise program including cardiovascular and weight bearing exercise. Medication refills:  Acyclovir 400 mg by mouth daily.  Labs with PCP. Patient has an upcoming appointment with her PCP and she will have her provider listen to her heart at that visit.  She may have her PCP take over her Acyclovir prescription and follow up here on a prn basis.  Follow up: 1 year or prn.    Additional counseling given.  Yes 20 min  total time was spent for this patient encounter, including preparation, face-to-face counseling with the patient, coordination of care, and documentation of the encounter in addition to doing the breast and pelvic exam.

## 2023-08-23 NOTE — Telephone Encounter (Signed)
 Patient states that she will just wait til she came to her appt 4/3

## 2023-08-27 ENCOUNTER — Other Ambulatory Visit: Payer: Self-pay

## 2023-08-27 DIAGNOSIS — B009 Herpesviral infection, unspecified: Secondary | ICD-10-CM

## 2023-08-27 MED ORDER — ACYCLOVIR 400 MG PO TABS: 400.0000 mg | ORAL_TABLET | Freq: Every day | ORAL | 3 refills | Status: DC

## 2023-08-27 NOTE — Telephone Encounter (Signed)
 Med refill request: Acyclovir Tabs 400 mg 90-days supply with 3 refills with [facility] Last AEX: 07/09/2022 BS Next AEX:09/05/2023 BS Last MMG (if hormonal med) 03/12/2022 Refill authorized: Last Rx sent #30 with zero refills on 08/13/2023 to [facility]. Please approve or deny as appropriate.

## 2023-09-02 DIAGNOSIS — I10 Essential (primary) hypertension: Secondary | ICD-10-CM | POA: Diagnosis not present

## 2023-09-02 DIAGNOSIS — M150 Primary generalized (osteo)arthritis: Secondary | ICD-10-CM | POA: Diagnosis not present

## 2023-09-02 DIAGNOSIS — E782 Mixed hyperlipidemia: Secondary | ICD-10-CM | POA: Diagnosis not present

## 2023-09-02 DIAGNOSIS — E669 Obesity, unspecified: Secondary | ICD-10-CM | POA: Diagnosis not present

## 2023-09-02 DIAGNOSIS — N1831 Chronic kidney disease, stage 3a: Secondary | ICD-10-CM | POA: Diagnosis not present

## 2023-09-02 DIAGNOSIS — E039 Hypothyroidism, unspecified: Secondary | ICD-10-CM | POA: Diagnosis not present

## 2023-09-04 DIAGNOSIS — L814 Other melanin hyperpigmentation: Secondary | ICD-10-CM | POA: Diagnosis not present

## 2023-09-04 DIAGNOSIS — L821 Other seborrheic keratosis: Secondary | ICD-10-CM | POA: Diagnosis not present

## 2023-09-04 DIAGNOSIS — L304 Erythema intertrigo: Secondary | ICD-10-CM | POA: Diagnosis not present

## 2023-09-04 DIAGNOSIS — Z85828 Personal history of other malignant neoplasm of skin: Secondary | ICD-10-CM | POA: Diagnosis not present

## 2023-09-04 DIAGNOSIS — C44712 Basal cell carcinoma of skin of right lower limb, including hip: Secondary | ICD-10-CM | POA: Diagnosis not present

## 2023-09-04 DIAGNOSIS — D1801 Hemangioma of skin and subcutaneous tissue: Secondary | ICD-10-CM | POA: Diagnosis not present

## 2023-09-05 ENCOUNTER — Ambulatory Visit (INDEPENDENT_AMBULATORY_CARE_PROVIDER_SITE_OTHER): Payer: Medicare Other | Admitting: Obstetrics and Gynecology

## 2023-09-05 ENCOUNTER — Encounter: Payer: Self-pay | Admitting: Obstetrics and Gynecology

## 2023-09-05 VITALS — BP 118/76 | HR 76 | Ht 62.0 in | Wt 150.0 lb

## 2023-09-05 DIAGNOSIS — Z5181 Encounter for therapeutic drug level monitoring: Secondary | ICD-10-CM

## 2023-09-05 DIAGNOSIS — Z9289 Personal history of other medical treatment: Secondary | ICD-10-CM

## 2023-09-05 DIAGNOSIS — B009 Herpesviral infection, unspecified: Secondary | ICD-10-CM | POA: Diagnosis not present

## 2023-09-05 DIAGNOSIS — Z9189 Other specified personal risk factors, not elsewhere classified: Secondary | ICD-10-CM

## 2023-09-05 DIAGNOSIS — Z01419 Encounter for gynecological examination (general) (routine) without abnormal findings: Secondary | ICD-10-CM

## 2023-09-05 MED ORDER — ACYCLOVIR 400 MG PO TABS: 400.0000 mg | ORAL_TABLET | Freq: Every day | ORAL | 3 refills | Status: DC

## 2023-09-05 NOTE — Patient Instructions (Signed)

## 2023-09-11 ENCOUNTER — Encounter: Payer: Self-pay | Admitting: Obstetrics and Gynecology

## 2023-09-19 DIAGNOSIS — Z23 Encounter for immunization: Secondary | ICD-10-CM | POA: Diagnosis not present

## 2023-09-19 DIAGNOSIS — M150 Primary generalized (osteo)arthritis: Secondary | ICD-10-CM | POA: Diagnosis not present

## 2023-09-19 DIAGNOSIS — E039 Hypothyroidism, unspecified: Secondary | ICD-10-CM | POA: Diagnosis not present

## 2023-09-19 DIAGNOSIS — M810 Age-related osteoporosis without current pathological fracture: Secondary | ICD-10-CM | POA: Diagnosis not present

## 2023-09-19 DIAGNOSIS — R011 Cardiac murmur, unspecified: Secondary | ICD-10-CM | POA: Diagnosis not present

## 2023-09-19 DIAGNOSIS — N1831 Chronic kidney disease, stage 3a: Secondary | ICD-10-CM | POA: Diagnosis not present

## 2023-09-19 DIAGNOSIS — R7303 Prediabetes: Secondary | ICD-10-CM | POA: Diagnosis not present

## 2023-09-19 DIAGNOSIS — E782 Mixed hyperlipidemia: Secondary | ICD-10-CM | POA: Diagnosis not present

## 2023-09-19 DIAGNOSIS — I10 Essential (primary) hypertension: Secondary | ICD-10-CM | POA: Diagnosis not present

## 2023-09-19 DIAGNOSIS — Z0000 Encounter for general adult medical examination without abnormal findings: Secondary | ICD-10-CM | POA: Diagnosis not present

## 2023-09-23 DIAGNOSIS — M4312 Spondylolisthesis, cervical region: Secondary | ICD-10-CM | POA: Diagnosis not present

## 2023-09-23 DIAGNOSIS — M47816 Spondylosis without myelopathy or radiculopathy, lumbar region: Secondary | ICD-10-CM | POA: Diagnosis not present

## 2023-09-23 DIAGNOSIS — S0990XA Unspecified injury of head, initial encounter: Secondary | ICD-10-CM | POA: Diagnosis not present

## 2023-09-23 DIAGNOSIS — M25551 Pain in right hip: Secondary | ICD-10-CM | POA: Diagnosis not present

## 2023-09-23 DIAGNOSIS — M4802 Spinal stenosis, cervical region: Secondary | ICD-10-CM | POA: Diagnosis not present

## 2023-09-23 DIAGNOSIS — M25552 Pain in left hip: Secondary | ICD-10-CM | POA: Diagnosis not present

## 2023-09-23 DIAGNOSIS — S12031A Nondisplaced posterior arch fracture of first cervical vertebra, initial encounter for closed fracture: Secondary | ICD-10-CM | POA: Diagnosis not present

## 2023-09-23 DIAGNOSIS — R531 Weakness: Secondary | ICD-10-CM | POA: Diagnosis not present

## 2023-09-23 DIAGNOSIS — M549 Dorsalgia, unspecified: Secondary | ICD-10-CM | POA: Diagnosis not present

## 2023-09-23 DIAGNOSIS — I672 Cerebral atherosclerosis: Secondary | ICD-10-CM | POA: Diagnosis not present

## 2023-09-23 DIAGNOSIS — W19XXXA Unspecified fall, initial encounter: Secondary | ICD-10-CM | POA: Diagnosis not present

## 2023-09-23 DIAGNOSIS — Z79899 Other long term (current) drug therapy: Secondary | ICD-10-CM | POA: Diagnosis not present

## 2023-09-23 DIAGNOSIS — M542 Cervicalgia: Secondary | ICD-10-CM | POA: Diagnosis not present

## 2023-09-23 DIAGNOSIS — M47812 Spondylosis without myelopathy or radiculopathy, cervical region: Secondary | ICD-10-CM | POA: Diagnosis not present

## 2023-09-23 DIAGNOSIS — I10 Essential (primary) hypertension: Secondary | ICD-10-CM | POA: Diagnosis not present

## 2023-09-23 DIAGNOSIS — R079 Chest pain, unspecified: Secondary | ICD-10-CM | POA: Diagnosis not present

## 2023-10-02 ENCOUNTER — Emergency Department (HOSPITAL_COMMUNITY)

## 2023-10-02 ENCOUNTER — Inpatient Hospital Stay (HOSPITAL_COMMUNITY)
Admission: EM | Admit: 2023-10-02 | Discharge: 2023-10-08 | DRG: 682 | Disposition: A | Discharge status: Skilled Nursing Facility | Source: Ambulatory Visit | Attending: Internal Medicine | Admitting: Internal Medicine

## 2023-10-02 ENCOUNTER — Encounter (HOSPITAL_COMMUNITY): Payer: Self-pay

## 2023-10-02 ENCOUNTER — Other Ambulatory Visit: Payer: Self-pay

## 2023-10-02 DIAGNOSIS — S12090A Other displaced fracture of first cervical vertebra, initial encounter for closed fracture: Secondary | ICD-10-CM | POA: Diagnosis not present

## 2023-10-02 DIAGNOSIS — Z90710 Acquired absence of both cervix and uterus: Secondary | ICD-10-CM

## 2023-10-02 DIAGNOSIS — Z8673 Personal history of transient ischemic attack (TIA), and cerebral infarction without residual deficits: Secondary | ICD-10-CM

## 2023-10-02 DIAGNOSIS — I959 Hypotension, unspecified: Secondary | ICD-10-CM | POA: Diagnosis not present

## 2023-10-02 DIAGNOSIS — R413 Other amnesia: Secondary | ICD-10-CM | POA: Diagnosis not present

## 2023-10-02 DIAGNOSIS — M810 Age-related osteoporosis without current pathological fracture: Secondary | ICD-10-CM | POA: Diagnosis not present

## 2023-10-02 DIAGNOSIS — E860 Dehydration: Secondary | ICD-10-CM | POA: Diagnosis not present

## 2023-10-02 DIAGNOSIS — N390 Urinary tract infection, site not specified: Secondary | ICD-10-CM | POA: Diagnosis not present

## 2023-10-02 DIAGNOSIS — M47816 Spondylosis without myelopathy or radiculopathy, lumbar region: Secondary | ICD-10-CM | POA: Diagnosis not present

## 2023-10-02 DIAGNOSIS — G9341 Metabolic encephalopathy: Secondary | ICD-10-CM | POA: Diagnosis not present

## 2023-10-02 DIAGNOSIS — Z7401 Bed confinement status: Secondary | ICD-10-CM | POA: Diagnosis not present

## 2023-10-02 DIAGNOSIS — S12000D Unspecified displaced fracture of first cervical vertebra, subsequent encounter for fracture with routine healing: Secondary | ICD-10-CM

## 2023-10-02 DIAGNOSIS — R0789 Other chest pain: Secondary | ICD-10-CM | POA: Diagnosis not present

## 2023-10-02 DIAGNOSIS — B9689 Other specified bacterial agents as the cause of diseases classified elsewhere: Secondary | ICD-10-CM | POA: Diagnosis not present

## 2023-10-02 DIAGNOSIS — I10 Essential (primary) hypertension: Secondary | ICD-10-CM | POA: Diagnosis present

## 2023-10-02 DIAGNOSIS — B9620 Unspecified Escherichia coli [E. coli] as the cause of diseases classified elsewhere: Secondary | ICD-10-CM | POA: Diagnosis not present

## 2023-10-02 DIAGNOSIS — M150 Primary generalized (osteo)arthritis: Secondary | ICD-10-CM | POA: Diagnosis not present

## 2023-10-02 DIAGNOSIS — F0390 Unspecified dementia without behavioral disturbance: Secondary | ICD-10-CM | POA: Diagnosis not present

## 2023-10-02 DIAGNOSIS — Z7983 Long term (current) use of bisphosphonates: Secondary | ICD-10-CM

## 2023-10-02 DIAGNOSIS — E663 Overweight: Secondary | ICD-10-CM | POA: Diagnosis not present

## 2023-10-02 DIAGNOSIS — W19XXXA Unspecified fall, initial encounter: Secondary | ICD-10-CM | POA: Diagnosis not present

## 2023-10-02 DIAGNOSIS — R9082 White matter disease, unspecified: Secondary | ICD-10-CM | POA: Diagnosis not present

## 2023-10-02 DIAGNOSIS — Z9181 History of falling: Secondary | ICD-10-CM | POA: Diagnosis not present

## 2023-10-02 DIAGNOSIS — E872 Acidosis, unspecified: Secondary | ICD-10-CM | POA: Diagnosis present

## 2023-10-02 DIAGNOSIS — Z515 Encounter for palliative care: Secondary | ICD-10-CM | POA: Diagnosis not present

## 2023-10-02 DIAGNOSIS — I700 Atherosclerosis of aorta: Secondary | ICD-10-CM | POA: Diagnosis not present

## 2023-10-02 DIAGNOSIS — R531 Weakness: Secondary | ICD-10-CM | POA: Diagnosis not present

## 2023-10-02 DIAGNOSIS — Z8249 Family history of ischemic heart disease and other diseases of the circulatory system: Secondary | ICD-10-CM

## 2023-10-02 DIAGNOSIS — R1310 Dysphagia, unspecified: Secondary | ICD-10-CM | POA: Diagnosis not present

## 2023-10-02 DIAGNOSIS — B370 Candidal stomatitis: Secondary | ICD-10-CM | POA: Diagnosis not present

## 2023-10-02 DIAGNOSIS — R4182 Altered mental status, unspecified: Secondary | ICD-10-CM | POA: Diagnosis not present

## 2023-10-02 DIAGNOSIS — S42201D Unspecified fracture of upper end of right humerus, subsequent encounter for fracture with routine healing: Secondary | ICD-10-CM | POA: Diagnosis not present

## 2023-10-02 DIAGNOSIS — B964 Proteus (mirabilis) (morganii) as the cause of diseases classified elsewhere: Secondary | ICD-10-CM | POA: Diagnosis not present

## 2023-10-02 DIAGNOSIS — Z806 Family history of leukemia: Secondary | ICD-10-CM

## 2023-10-02 DIAGNOSIS — S12100D Unspecified displaced fracture of second cervical vertebra, subsequent encounter for fracture with routine healing: Secondary | ICD-10-CM

## 2023-10-02 DIAGNOSIS — R5381 Other malaise: Secondary | ICD-10-CM | POA: Diagnosis not present

## 2023-10-02 DIAGNOSIS — S3991XA Unspecified injury of abdomen, initial encounter: Secondary | ICD-10-CM | POA: Diagnosis not present

## 2023-10-02 DIAGNOSIS — S2242XD Multiple fractures of ribs, left side, subsequent encounter for fracture with routine healing: Secondary | ICD-10-CM | POA: Diagnosis not present

## 2023-10-02 DIAGNOSIS — R5383 Other fatigue: Secondary | ICD-10-CM | POA: Diagnosis not present

## 2023-10-02 DIAGNOSIS — S2242XA Multiple fractures of ribs, left side, initial encounter for closed fracture: Secondary | ICD-10-CM | POA: Diagnosis present

## 2023-10-02 DIAGNOSIS — Z28311 Partially vaccinated for covid-19: Secondary | ICD-10-CM | POA: Diagnosis not present

## 2023-10-02 DIAGNOSIS — E8809 Other disorders of plasma-protein metabolism, not elsewhere classified: Secondary | ICD-10-CM | POA: Diagnosis not present

## 2023-10-02 DIAGNOSIS — Z87891 Personal history of nicotine dependence: Secondary | ICD-10-CM

## 2023-10-02 DIAGNOSIS — M47812 Spondylosis without myelopathy or radiculopathy, cervical region: Secondary | ICD-10-CM | POA: Diagnosis not present

## 2023-10-02 DIAGNOSIS — E861 Hypovolemia: Secondary | ICD-10-CM | POA: Diagnosis not present

## 2023-10-02 DIAGNOSIS — N178 Other acute kidney failure: Secondary | ICD-10-CM | POA: Diagnosis not present

## 2023-10-02 DIAGNOSIS — E039 Hypothyroidism, unspecified: Secondary | ICD-10-CM | POA: Diagnosis present

## 2023-10-02 DIAGNOSIS — R6889 Other general symptoms and signs: Secondary | ICD-10-CM

## 2023-10-02 DIAGNOSIS — Z23 Encounter for immunization: Secondary | ICD-10-CM | POA: Diagnosis not present

## 2023-10-02 DIAGNOSIS — Z820 Family history of epilepsy and other diseases of the nervous system: Secondary | ICD-10-CM

## 2023-10-02 DIAGNOSIS — Z96652 Presence of left artificial knee joint: Secondary | ICD-10-CM | POA: Diagnosis present

## 2023-10-02 DIAGNOSIS — E669 Obesity, unspecified: Secondary | ICD-10-CM | POA: Diagnosis not present

## 2023-10-02 DIAGNOSIS — Z7189 Other specified counseling: Secondary | ICD-10-CM | POA: Diagnosis not present

## 2023-10-02 DIAGNOSIS — Z79899 Other long term (current) drug therapy: Secondary | ICD-10-CM

## 2023-10-02 DIAGNOSIS — E43 Unspecified severe protein-calorie malnutrition: Secondary | ICD-10-CM | POA: Diagnosis not present

## 2023-10-02 DIAGNOSIS — E7800 Pure hypercholesterolemia, unspecified: Secondary | ICD-10-CM | POA: Diagnosis not present

## 2023-10-02 DIAGNOSIS — N179 Acute kidney failure, unspecified: Principal | ICD-10-CM | POA: Diagnosis present

## 2023-10-02 DIAGNOSIS — Z299 Encounter for prophylactic measures, unspecified: Secondary | ICD-10-CM | POA: Diagnosis not present

## 2023-10-02 DIAGNOSIS — S060X9A Concussion with loss of consciousness of unspecified duration, initial encounter: Secondary | ICD-10-CM | POA: Diagnosis not present

## 2023-10-02 DIAGNOSIS — E8729 Other acidosis: Secondary | ICD-10-CM | POA: Diagnosis not present

## 2023-10-02 DIAGNOSIS — R4781 Slurred speech: Secondary | ICD-10-CM | POA: Diagnosis not present

## 2023-10-02 DIAGNOSIS — E782 Mixed hyperlipidemia: Secondary | ICD-10-CM | POA: Diagnosis not present

## 2023-10-02 DIAGNOSIS — S2232XD Fracture of one rib, left side, subsequent encounter for fracture with routine healing: Secondary | ICD-10-CM | POA: Diagnosis not present

## 2023-10-02 DIAGNOSIS — R627 Adult failure to thrive: Principal | ICD-10-CM | POA: Diagnosis present

## 2023-10-02 DIAGNOSIS — B9629 Other Escherichia coli [E. coli] as the cause of diseases classified elsewhere: Secondary | ICD-10-CM | POA: Diagnosis not present

## 2023-10-02 DIAGNOSIS — D649 Anemia, unspecified: Secondary | ICD-10-CM | POA: Diagnosis present

## 2023-10-02 DIAGNOSIS — M160 Bilateral primary osteoarthritis of hip: Secondary | ICD-10-CM | POA: Diagnosis not present

## 2023-10-02 DIAGNOSIS — Z79890 Hormone replacement therapy (postmenopausal): Secondary | ICD-10-CM

## 2023-10-02 DIAGNOSIS — R1312 Dysphagia, oropharyngeal phase: Secondary | ICD-10-CM | POA: Diagnosis not present

## 2023-10-02 DIAGNOSIS — D6489 Other specified anemias: Secondary | ICD-10-CM | POA: Diagnosis not present

## 2023-10-02 DIAGNOSIS — S12030K Displaced posterior arch fracture of first cervical vertebra, subsequent encounter for fracture with nonunion: Secondary | ICD-10-CM | POA: Diagnosis not present

## 2023-10-02 DIAGNOSIS — R270 Ataxia, unspecified: Secondary | ICD-10-CM | POA: Diagnosis not present

## 2023-10-02 LAB — COMPREHENSIVE METABOLIC PANEL WITH GFR
ALT: 16 U/L (ref 0–44)
AST: 28 U/L (ref 15–41)
Albumin: 4.3 g/dL (ref 3.5–5.0)
Alkaline Phosphatase: 43 U/L (ref 38–126)
Anion gap: 10 (ref 5–15)
BUN: 71 mg/dL — ABNORMAL HIGH (ref 8–23)
CO2: 23 mmol/L (ref 22–32)
Calcium: 15 mg/dL (ref 8.9–10.3)
Chloride: 109 mmol/L (ref 98–111)
Creatinine, Ser: 2.41 mg/dL — ABNORMAL HIGH (ref 0.44–1.00)
GFR, Estimated: 19 mL/min — ABNORMAL LOW (ref >60)
Glucose, Bld: 122 mg/dL — ABNORMAL HIGH (ref 70–99)
Potassium: 4.6 mmol/L (ref 3.5–5.1)
Sodium: 142 mmol/L (ref 135–145)
Total Bilirubin: 1 mg/dL (ref 0.0–1.2)
Total Protein: 7.8 g/dL (ref 6.5–8.1)

## 2023-10-02 LAB — BASIC METABOLIC PANEL WITH GFR
Anion gap: 14 (ref 5–15)
BUN: 63 mg/dL — ABNORMAL HIGH (ref 8–23)
CO2: 19 mmol/L — ABNORMAL LOW (ref 22–32)
Calcium: 14.1 mg/dL (ref 8.9–10.3)
Chloride: 113 mmol/L — ABNORMAL HIGH (ref 98–111)
Creatinine, Ser: 1.9 mg/dL — ABNORMAL HIGH (ref 0.44–1.00)
GFR, Estimated: 25 mL/min — ABNORMAL LOW (ref >60)
Glucose, Bld: 92 mg/dL (ref 70–99)
Potassium: 4.8 mmol/L (ref 3.5–5.1)
Sodium: 146 mmol/L — ABNORMAL HIGH (ref 135–145)

## 2023-10-02 LAB — AMMONIA: Ammonia: 17 umol/L (ref 9–35)

## 2023-10-02 LAB — T4, FREE: Free T4: 1.1 ng/dL (ref 0.61–1.12)

## 2023-10-02 LAB — CBC WITH DIFFERENTIAL/PLATELET
Abs Immature Granulocytes: 0.07 10*3/uL (ref 0.00–0.07)
Basophils Absolute: 0.1 10*3/uL (ref 0.0–0.1)
Basophils Relative: 1 %
Eosinophils Absolute: 0.2 10*3/uL (ref 0.0–0.5)
Eosinophils Relative: 2 %
HCT: 41.8 % (ref 36.0–46.0)
Hemoglobin: 13.7 g/dL (ref 12.0–15.0)
Immature Granulocytes: 1 %
Lymphocytes Relative: 15 %
Lymphs Abs: 2.2 10*3/uL (ref 0.7–4.0)
MCH: 33.3 pg (ref 26.0–34.0)
MCHC: 32.8 g/dL (ref 30.0–36.0)
MCV: 101.5 fL — ABNORMAL HIGH (ref 80.0–100.0)
Monocytes Absolute: 1.1 10*3/uL — ABNORMAL HIGH (ref 0.1–1.0)
Monocytes Relative: 7 %
Neutro Abs: 10.9 10*3/uL — ABNORMAL HIGH (ref 1.7–7.7)
Neutrophils Relative %: 74 %
Platelets: 251 10*3/uL (ref 150–400)
RBC: 4.12 MIL/uL (ref 3.87–5.11)
RDW: 12.6 % (ref 11.5–15.5)
WBC: 14.5 10*3/uL — ABNORMAL HIGH (ref 4.0–10.5)
nRBC: 0 % (ref 0.0–0.2)

## 2023-10-02 LAB — I-STAT VENOUS BLOOD GAS, ED
Acid-Base Excess: 0 mmol/L (ref 0.0–2.0)
Bicarbonate: 25 mmol/L (ref 20.0–28.0)
Calcium, Ion: 1.87 mmol/L (ref 1.15–1.40)
HCT: 41 % (ref 36.0–46.0)
Hemoglobin: 13.9 g/dL (ref 12.0–15.0)
O2 Saturation: 69 %
Potassium: 4.7 mmol/L (ref 3.5–5.1)
Sodium: 142 mmol/L (ref 135–145)
TCO2: 26 mmol/L (ref 22–32)
pCO2, Ven: 39.9 mmHg — ABNORMAL LOW (ref 44–60)
pH, Ven: 7.405 (ref 7.25–7.43)
pO2, Ven: 36 mmHg (ref 32–45)

## 2023-10-02 LAB — TROPONIN I (HIGH SENSITIVITY)
Troponin I (High Sensitivity): 32 ng/L — ABNORMAL HIGH (ref <18)
Troponin I (High Sensitivity): 33 ng/L — ABNORMAL HIGH (ref <18)

## 2023-10-02 LAB — VITAMIN D 25 HYDROXY (VIT D DEFICIENCY, FRACTURES): Vit D, 25-Hydroxy: 74.72 ng/mL (ref 30–100)

## 2023-10-02 LAB — ETHANOL: Alcohol, Ethyl (B): <15 mg/dL (ref <15)

## 2023-10-02 LAB — RAPID URINE DRUG SCREEN, HOSP PERFORMED
Amphetamines: NOT DETECTED
Barbiturates: NOT DETECTED
Benzodiazepines: NOT DETECTED
Cocaine: NOT DETECTED
Opiates: NOT DETECTED
Tetrahydrocannabinol: NOT DETECTED

## 2023-10-02 LAB — TSH: TSH: 1.569 u[IU]/mL (ref 0.350–4.500)

## 2023-10-02 LAB — I-STAT CG4 LACTIC ACID, ED: Lactic Acid, Venous: 1.6 mmol/L (ref 0.5–1.9)

## 2023-10-02 LAB — CBG MONITORING, ED: Glucose-Capillary: 126 mg/dL — ABNORMAL HIGH (ref 70–99)

## 2023-10-02 MED ORDER — ONDANSETRON HCL 4 MG/2ML IJ SOLN: 4.0000 mg | Freq: Four times a day (QID) | INTRAMUSCULAR | Status: DC | PRN

## 2023-10-02 MED ORDER — CALCITONIN (SALMON) 200 UNIT/ACT NA SOLN: 1.0000 | Freq: Every day | NASAL | Status: DC

## 2023-10-02 MED ORDER — ACETAMINOPHEN 650 MG RE SUPP: 650.0000 mg | Freq: Four times a day (QID) | RECTAL | Status: DC | PRN

## 2023-10-02 MED ORDER — ACETAMINOPHEN 325 MG PO TABS: 650.0000 mg | ORAL_TABLET | Freq: Four times a day (QID) | ORAL | Status: DC | PRN

## 2023-10-02 MED ORDER — CALCITONIN (SALMON) 200 UNIT/ML IJ SOLN
4.0000 [IU]/kg | Freq: Two times a day (BID) | INTRAMUSCULAR | Status: AC
  Administered 2023-10-03 – 2023-10-04 (×4): 264 [IU] via SUBCUTANEOUS
  Filled 2023-10-02 (×5): qty 1.32

## 2023-10-02 MED ORDER — ENOXAPARIN SODIUM 30 MG/0.3ML IJ SOSY
30.0000 mg | PREFILLED_SYRINGE | INTRAMUSCULAR | Status: DC
  Administered 2023-10-03 – 2023-10-08 (×7): 30 mg via SUBCUTANEOUS
  Filled 2023-10-02 (×7): qty 0.3

## 2023-10-02 MED ORDER — SODIUM CHLORIDE 0.9 % IV SOLN: INTRAVENOUS | Status: DC

## 2023-10-02 MED ORDER — SODIUM CHLORIDE 0.9 % IV BOLUS
1000.0000 mL | Freq: Once | INTRAVENOUS | Status: AC
  Administered 2023-10-02: 1000 mL via INTRAVENOUS

## 2023-10-02 MED ORDER — ONDANSETRON HCL 4 MG PO TABS
4.0000 mg | ORAL_TABLET | Freq: Four times a day (QID) | ORAL | Status: DC | PRN
Start: 2023-10-02 — End: 2023-10-09

## 2023-10-02 MED ORDER — SENNOSIDES-DOCUSATE SODIUM 8.6-50 MG PO TABS: 1.0000 | ORAL_TABLET | Freq: Every evening | ORAL | Status: DC | PRN

## 2023-10-02 MED ORDER — LACTATED RINGERS IV BOLUS
1000.0000 mL | Freq: Once | INTRAVENOUS | Status: AC
  Administered 2023-10-03: 1000 mL via INTRAVENOUS

## 2023-10-02 MED ORDER — LACTATED RINGERS IV SOLN: INTRAVENOUS | Status: DC

## 2023-10-02 NOTE — ED Notes (Signed)
 6 N called and verified that RN was ready for patient.  All questions answered.

## 2023-10-02 NOTE — ED Triage Notes (Signed)
 Pt fell 10 days ago. Over last 10 days, pt declined from walker to wheel chaired bound. Bilateral weakness. Axox2 and usually axox4. GCS 14.

## 2023-10-02 NOTE — Care Management (Signed)
-   Late documentation-  This RNCM received a call from [facility] ED nursing secretary transferring the patient's daughter Abran Abrahams re: patient's current status. This RNCM spoke with Abran Abrahams who questioning if patient has received labs yet. Per chart review patient is currently in radiology. Notified RN to determine if able to speak with patient's daughter. Patient's daughter reports she on her way back to the hospital.  No TOC needs at this time.

## 2023-10-02 NOTE — ED Provider Notes (Signed)
 [place] [facility] DEPARTMENT AT [facility] Provider Note   CSN: [id number] Arrival date & time: 10/02/23  1425     History  Chief Complaint  Patient presents with   Kimberly Webb is a 89 y.o. female.  89 yo F with a chief complaints of failure to thrive.  The patient has not been doing well since she suffered from a fall.  She went to an outside hospital and was worked up there.  Reportedly no obvious acute injuries.  Since then she is debilitated becoming more more fatigued and has not been able to walk.  She does not complain of any pain when I talk with her.  She tells me that she is just fatigued.  She has trouble explaining it in any other way.  She denies cough congestion or fever.   Fall       Home Medications Prior to Admission medications   Medication Sig Start Date End Date Taking? Authorizing Provider  acetaminophen  (TYLENOL ) 650 MG CR tablet Take 650 mg by mouth every 8 (eight) hours as needed for pain.    [provider]  acyclovir  (ZOVIRAX ) 400 MG tablet Take 1 tablet (400 mg total) by mouth daily. 09/05/23   Greta Leatherwood, MD  amLODipine  (NORVASC ) 5 MG tablet Take 5 mg by mouth daily.    [provider]  denosumab  (PROLIA ) 60 MG/ML SOLN injection Inject 60 mg into the skin every 6 (six) months. Administer in upper arm, thigh, or abdomen    [provider]  Glucosamine HCl (GLUCOSAMINE PO) Take by mouth.    [provider]  hydrochlorothiazide  (HYDRODIURIL ) 25 MG tablet Take 25 mg by mouth daily. 08/04/13   [provider]  levothyroxine  (SYNTHROID ) 88 MCG tablet Take 88 mcg by mouth every morning. 05/02/21   [provider]  lisinopril  (ZESTRIL ) 10 MG tablet Take 10 mg by mouth daily. 05/08/19   [provider]  Multiple Vitamin (MULTIVITAMIN) capsule Take 1 capsule by mouth daily.    [provider]  niacin 500 MG tablet Take 500 mg by mouth at bedtime.     [provider]  Omega-3 Fatty Acids (FISH OIL PO) Take by mouth.    [provider]  simvastatin  (ZOCOR ) 40 MG tablet Take 40 mg by mouth at bedtime.    [provider]  traMADol  (ULTRAM ) 50 MG tablet Take 1 tablet (50 mg total) by mouth every 6 (six) hours as needed. 01/10/21   Haviland, Julie, MD      Allergies    Patient has no known allergies.    Review of Systems   Review of Systems  Physical Exam Updated Vital Signs BP 106/69   Pulse 60   Temp 98.4 F (36.9 C) (Rectal)   Resp 17   Ht 5\' 2"  (1.575 m)   Wt 65.8 kg   SpO2 99%   BMI 26.52 kg/m  Physical Exam Vitals and nursing note reviewed.  Constitutional:      General: She is not in acute distress.    Appearance: She is well-developed. She is not diaphoretic.  HENT:     Head: Normocephalic and atraumatic.  Eyes:     Pupils: Pupils are equal, round, and reactive to light.  Cardiovascular:     Rate and Rhythm: Normal rate and regular rhythm.     Heart sounds: No murmur heard.    No friction rub. No gallop.  Pulmonary:  Effort: Pulmonary effort is normal.     Breath sounds: No wheezing or rales.  Abdominal:     General: There is no distension.     Palpations: Abdomen is soft.     Tenderness: There is no abdominal tenderness.  Musculoskeletal:        General: No tenderness.     Cervical back: Normal range of motion and neck supple.  Skin:    General: Skin is warm and dry.  Neurological:     Mental Status: She is alert and oriented to person, place, and time.  Psychiatric:        Behavior: Behavior normal.     ED Results / Procedures / Treatments   Labs (all labs ordered are listed, but only abnormal results are displayed) Labs Reviewed  CBC WITH DIFFERENTIAL/PLATELET - Abnormal; Notable for the following components:      Result Value   WBC 14.5 (*)    MCV 101.5 (*)    Neutro Abs 10.9 (*)    Monocytes Absolute 1.1 (*)    All other components within normal limits  CBG  MONITORING, ED - Abnormal; Notable for the following components:   Glucose-Capillary 126 (*)    All other components within normal limits  I-STAT VENOUS BLOOD GAS, ED - Abnormal; Notable for the following components:   pCO2, Ven 39.9 (*)    Calcium, Ion 1.87 (*)    All other components within normal limits  TROPONIN I (HIGH SENSITIVITY) - Abnormal; Notable for the following components:   Troponin I (High Sensitivity) 32 (*)    All other components within normal limits  AMMONIA  ETHANOL  TSH  COMPREHENSIVE METABOLIC PANEL WITH GFR  RAPID URINE DRUG SCREEN, HOSP PERFORMED  URINALYSIS, ROUTINE W REFLEX MICROSCOPIC  T4, FREE  I-STAT CG4 LACTIC ACID, ED    EKG EKG Interpretation Date/Time:  Wednesday October 02 2023 14:32:43 EDT Ventricular Rate:  87 PR Interval:  200 QRS Duration:  96 QT Interval:  405 QTC Calculation: 434 R Axis:   -8  Text Interpretation: Sinus rhythm Paired ventricular premature complexes Probable anterior infarct, age indeterminate Otherwise no significant change Confirmed by Albertus Hughs [phone]) on 10/02/2023 3:04:38 PM  Radiology DG Chest Port 1 View Result Date: 10/02/2023 CLINICAL DATA:  Fall 10 days ago.  Altered mental status. EXAM: PORTABLE CHEST 1 VIEW COMPARISON:  06/24/2023. FINDINGS: Stable cardiomediastinal silhouette. Aortic atherosclerosis. No focal consolidation, sizeable pleural effusion, or appreciable pneumothorax. Mildly displaced fracture of the left lateral eighth rib, likely acute to subacute. Remote healed fracture of the right proximal humerus. IMPRESSION: Mildly displaced fracture of the left lateral eighth rib, likely acute to subacute. No appreciable pneumothorax. Electronically Signed   By: Mannie Seek M.D.   On: 10/02/2023 15:12   DG Pelvis Portable Result Date: 10/02/2023 CLINICAL DATA:  Fall 10 days ago with increasing weakness. EXAM: PORTABLE PELVIS 1-2 VIEWS COMPARISON:  09/23/2023 FINDINGS: Overlapping telemetry wires. No acute  fracture or dislocation identified. Femoral heads are seated within the acetabula. Sacroiliac joints and pubic symphysis are anatomically aligned. Mild degenerative changes of the bilateral hips. Degenerative changes of the visualized lower lumbar spine. Vascular calcifications are noted. IMPRESSION: No acute osseous abnormality identified on single AP pelvic radiograph. Electronically Signed   By: Mannie Seek M.D.   On: 10/02/2023 15:08    Procedures Procedures    Medications Ordered in ED Medications  sodium chloride  0.9 % bolus 1,000 mL (0 mLs Intravenous Stopped 10/02/23 1610)    ED  Course/ Medical Decision Making/ A&P                                 Medical Decision Making Amount and/or Complexity of Data Reviewed Labs: ordered. Radiology: ordered.   89 yo F with a chief complaints of feeling to thrive.  The patient reportedly fell and was seen in as an outside hospital.  Since then she has declined and is now requiring wheelchair for movement.  She went to see her family doctor reportedly today and they sent her here for evaluation.  Patient is unable to provide much history.  She tells me she is fatigued.  Denies pain anywhere.  Will obtain a laboratory evaluation bolus of IV fluids reassess.  Radiology read with possible rib fracture.  Will obtain CT CAP.   The patients results and plan were reviewed and discussed.   Any x-rays performed were independently reviewed by myself.   Differential diagnosis were considered with the presenting HPI.  Medications  sodium chloride  0.9 % bolus 1,000 mL (0 mLs Intravenous Stopped 10/02/23 1610)    Vitals:   10/02/23 1508 10/02/23 1515 10/02/23 1530 10/02/23 1600  BP:  (!) 142/84 (!) 138/98 106/69  Pulse:  65 (!) 59 60  Resp:  13 20 17   Temp: 98.4 F (36.9 C)     TempSrc: Rectal     SpO2:  100% 100% 99%  Weight:      Height:        Final diagnoses:  Failure to thrive in adult    Admission/ observation were  discussed with the admitting physician, patient and/or family and they are comfortable with the plan.           Final Clinical Impression(s) / ED Diagnoses Final diagnoses:  Failure to thrive in adult    Rx / DC Orders ED Discharge Orders     None         Albertus Hughs, DO 10/02/23 1615

## 2023-10-02 NOTE — H&P (Signed)
 History and Physical  Kimberly Webb:[id number] DOB: May 10, 1933 DOA: 10/02/2023  PCP: Brenita Callow, L.Rozelle Corning, MD (Inactive)   Chief Complaint: Fall, generalized weakness  HPI: Kimberly Webb is a 89 y.o. female with medical history significant for HTN, HLD, hypothyroidism, osteoporosis and arthritis who presented to the ED for evaluation of generalized weakness after recent fall.  Patient altered during evaluation so history obtained from daughter at bedside. Per daughter, patient fell 10 days ago.  She was evaluated at [facility] and had a negative trauma scan. Patient was initially able to ambulate with a walker even after the fall however over the last few days she has become significantly fatigued and weak. She is unable to ambulate on her own and has also had decreased p.o. intake. She was evaluated by her PCP and advised to present to the ED due to concern for possible brain bleed.  Patient has not had any fevers, cough, dysuria or chest pain.  ED Course: Initial vitals show temp 97.5, RR 15, HR 65, BP 127/75, SpO2 87% on room air.  Labs significant for sodium 142, K+ 4.6, blood glucose 122, BUN/creatinine 71/2.41, calcium 15.0, WBC 14.5, Hgb 13.7, platelet 251, VBG unremarkable, TSH 1.57, free T4 1.10, troponin 32-33, lactic acid 1.6. CXR shows mildly displaced fracture of the left lateral eighth rib likely acute to subacute but no pneumothorax. Pelvis x-ray and CT head negative. CT C/A/P shows minimally displaced fractures of the left anterior 3rd-7th ribs and displaced fracture of the left lateral 9th rib.  Trauma surgery was consulted for evaluation.  Patient received IV NS 1 L bolus x 2.  TRH was consulted for admission.  Review of Systems: Please see HPI for pertinent positives and negatives. A complete 10 system review of systems are otherwise negative.  Past Medical History:  Diagnosis Date   Arthritis    Atrophic vaginitis    Broken collarbone    Broken neck (HCC)     Broken ribs    Cystocele    Elevated serum creatinine    decreased GFR   Fibroid    Hypercholesteremia    Hypertension    Hypothyroidism    Osteoporosis    Paratubal cyst    Rectocele    STD (sexually transmitted disease)    HSV   Uterine prolapse    Past Surgical History:  Procedure Laterality Date   BROKEN ANKLE     BROKEN FEMUR     CATARACT EXTRACTION Bilateral    FOOT SURGERY Right    TOE   HERNIA REPAIR  2009   OOPHORECTOMY     BSO WITH VAG HYST IN 2004   REPLACEMENT TOTAL KNEE  2011   LEFT X 2   THYROIDECTOMY, PARTIAL     VAGINAL HYSTERECTOMY  2004   VAG HYST, BSO, A&P REPAIR   Social History:  reports that she has quit smoking. She has never used smokeless tobacco. She reports that she does not drink alcohol and does not use drugs.  No Known Allergies  Family History  Problem Relation Age of Onset   Heart disease Mother    Hypertension Father    Hypertension Sister    Leukemia Sister    Heart disease Brother    Multiple sclerosis Brother    Parkinsonism Brother      Prior to Admission medications   Medication Sig Start Date End Date Taking? Authorizing Provider  acetaminophen  (TYLENOL ) 650 MG CR tablet Take 650 mg by mouth every 8 (eight) hours  as needed for pain.   Yes [provider]  acyclovir  (ZOVIRAX ) 400 MG tablet Take 400 mg by mouth daily.   Yes [provider]  amLODipine  (NORVASC ) 5 MG tablet Take 5 mg by mouth daily.   Yes [provider]  denosumab  (PROLIA ) 60 MG/ML SOLN injection Inject 60 mg into the skin every 6 (six) months. Administer in upper arm, thigh, or abdomen   Yes [provider]  diphenhydramine-acetaminophen  (TYLENOL  PM) 25-500 MG TABS tablet Take 1 tablet by mouth at bedtime as needed.   Yes [provider]  Glucosamine HCl (GLUCOSAMINE PO) Take 1 Dose by mouth daily.   Yes [provider]  hydrochlorothiazide  (HYDRODIURIL ) 25 MG tablet Take 25 mg by mouth daily. 08/04/13  Yes  [provider]  levothyroxine  (SYNTHROID ) 100 MCG tablet Take 100 mcg by mouth every morning. 07/21/23  Yes [provider]  lisinopril  (ZESTRIL ) 10 MG tablet Take 10 mg by mouth daily. 05/08/19  Yes [provider]  Multiple Vitamin (MULTIVITAMIN) capsule Take 1 capsule by mouth daily.   Yes [provider]  niacin  500 MG tablet Take 500 mg by mouth at bedtime.   Yes [provider]  Omega-3 Fatty Acids (FISH OIL PO) Take 1 capsule by mouth daily.   Yes [provider]  simvastatin  (ZOCOR ) 40 MG tablet Take 40 mg by mouth at bedtime.   Yes [provider]    Physical Exam: BP 139/77   Pulse 84   Temp 98.4 F (36.9 C) (Axillary)   Resp 12   Ht 5\' 2"  (1.575 m)   Wt 65.8 kg   SpO2 94%   BMI 26.52 kg/m  General: Deconditioned elderly woman laying in bed. No acute distress. HEENT: [place]/AT. Anicteric sclera.  Dry mucous membrane. CV: RRR. No murmurs, rubs, or gallops. No LE edema Pulmonary: Lungs CTAB. Normal effort. No wheezing or rales. Abdominal: Soft, nontender, nondistended. Normal bowel sounds. Extremities: Palpable radial and DP pulses. Normal ROM. Skin: Warm and dry. No obvious rash or lesions.  Decree skin turgor. Neuro: Alert and oriented to self and person only. Mumbling some incomprehensible words during evaluation. Moves all extremities. Normal sensation to light touch.           Labs on Admission:  Basic Metabolic Panel: Recent Labs  Lab 10/02/23 1443 10/02/23 1505  NA 142 142  K 4.6 4.7  CL 109  --   CO2 23  --   GLUCOSE 122*  --   BUN 71*  --   CREATININE 2.41*  --   CALCIUM 15.0*  --    Liver Function Tests: Recent Labs  Lab 10/02/23 1443  AST 28  ALT 16  ALKPHOS 43  BILITOT 1.0  PROT 7.8  ALBUMIN 4.3   No results for input(s): "LIPASE", "AMYLASE" in the last 168 hours. Recent Labs  Lab 10/02/23 1433  AMMONIA 17   CBC: Recent Labs  Lab 10/02/23 1443 10/02/23 1505  WBC 14.5*  --    NEUTROABS 10.9*  --   HGB 13.7 13.9  HCT 41.8 41.0  MCV 101.5*  --   PLT 251  --    Cardiac Enzymes: No results for input(s): "CKTOTAL", "CKMB", "CKMBINDEX", "TROPONINI" in the last 168 hours. BNP (last 3 results) No results for input(s): "BNP" in the last 8760 hours.  ProBNP (last 3 results) No results for input(s): "PROBNP" in the last 8760 hours.  CBG: Recent Labs  Lab 10/02/23 1441  GLUCAP 126*  Radiological Exams on Admission: CT CHEST ABDOMEN PELVIS WO CONTRAST Result Date: 10/02/2023 CLINICAL DATA:  Blunt polytrauma. Patient fell 10 days ago. Bilateral weakness. Patient declined from walking with a walker to being wheelchair-bound over the last 10 days. EXAM: CT CHEST, ABDOMEN AND PELVIS WITHOUT CONTRAST TECHNIQUE: Multidetector CT imaging of the chest, abdomen and pelvis was performed following the standard protocol without IV contrast. RADIATION DOSE REDUCTION: This exam was performed according to the departmental dose-optimization program which includes automated exposure control, adjustment of the mA and/or kV according to patient size and/or use of iterative reconstruction technique. COMPARISON:  Same day chest and pelvis radiographs FINDINGS: CT CHEST FINDINGS Cardiovascular: No pericardial effusion. Coronary artery and aortic atherosclerotic calcification. Evaluation for aortic injury is limited without IV contrast. Mediastinum/Nodes: Trachea and esophagus are unremarkable. No mediastinal hematoma. Lungs/Pleura: Respiratory motion obscures detail. Hypoventilation changes in the lower lobes. Bilateral lower lobe scarring. No pleural effusion or pneumothorax. Musculoskeletal: Acute minimally displaced fractures of the left anterior 3rd-7th ribs. Displaced fracture of the left lateral ninth rib. Subacute or chronic fracture of the posterior left tenth rib. CT ABDOMEN PELVIS FINDINGS Hepatobiliary: Gallbladder sludge. No evidence of acute cholecystitis. Unremarkable noncontrast  appearance of the liver. Pancreas: No acute abnormality. Spleen: Unremarkable. Adrenals/Urinary Tract: No adrenal hemorrhage or renal injury identified. Bladder is unremarkable. Stomach/Bowel: Normal caliber large and small bowel. Colonic diverticulosis without diverticulitis. No bowel wall thickening. Stomach is within normal limits. Vascular/Lymphatic: Aortic atherosclerosis. No enlarged abdominal or pelvic lymph nodes. Reproductive: Hysterectomy. Other: No free intraperitoneal fluid or air. Musculoskeletal: No acute fracture. IMPRESSION: 1. Acute minimally displaced fractures of the left anterior 3rd-7th ribs. Displaced fracture of the left lateral 9th rib. 2. No evidence of acute traumatic injury in the abdomen or pelvis. 3. Aortic Atherosclerosis (ICD10-I70.0). Electronically Signed   By: Rozell Cornet M.D.   On: 10/02/2023 19:17   CT HEAD WO CONTRAST Result Date: 10/02/2023 CLINICAL DATA:  Mental status change, persistent or worsening. EXAM: CT HEAD WITHOUT CONTRAST TECHNIQUE: Contiguous axial images were obtained from the base of the skull through the vertex without intravenous contrast. RADIATION DOSE REDUCTION: This exam was performed according to the departmental dose-optimization program which includes automated exposure control, adjustment of the mA and/or kV according to patient size and/or use of iterative reconstruction technique. COMPARISON:  CT head without contrast 09/23/2023 at [facility] [employer]. FINDINGS: Brain: Remote lacunar infarcts are again noted within the basal ganglia and thalami bilaterally. No acute infarct, hemorrhage, or mass lesion is present. Moderate atrophy and white matter disease is stable. The ventricles are of normal size. No significant extraaxial fluid collection is present. The brainstem and cerebellum are within normal limits. Midline structures are within normal limits. Vascular: No hyperdense vessel or unexpected calcification. Skull: Calvarium is  intact. No focal lytic or blastic lesions are present. No significant extracranial soft tissue lesion is present. Sinuses/Orbits: The paranasal sinuses and mastoid air cells are clear. Bilateral lens replacements are noted. Globes and orbits are otherwise unremarkable. IMPRESSION: 1. No acute intracranial abnormality or significant interval change. 2. Remote lacunar infarcts of the basal ganglia and thalami bilaterally. 3. Stable atrophy and white matter disease. This likely reflects the sequela of chronic microvascular ischemia. Electronically Signed   By: Audree Leas M.D.   On: 10/02/2023 19:05   CT Cervical Spine Wo Contrast Result Date: 10/02/2023 CLINICAL DATA:  Ataxia. Cervical trauma. Fall 10 days ago. Patient has declined from walking with a walker to now wheelchair-bound. EXAM: CT  CERVICAL SPINE WITHOUT CONTRAST TECHNIQUE: Multidetector CT imaging of the cervical spine was performed without intravenous contrast. Multiplanar CT image reconstructions were also generated. RADIATION DOSE REDUCTION: This exam was performed according to the departmental dose-optimization program which includes automated exposure control, adjustment of the mA and/or kV according to patient size and/or use of iterative reconstruction technique. COMPARISON:  CT of the cervical spine 01/11/2020 FINDINGS: Alignment: Slight degenerative anterolisthesis at C4-5 is similar to prior studies. Straightening of the normal cervical lordosis is present. Chronic leftward curvature of the lower cervical spine is noted. Skull base and vertebrae: A chronic non healed fracture is present in the right posterior arch of C1. No additional C1 fracture is present. A remote oblique healed fracture is present in the body of C2 on the left. No acute fractures are present in the cervical spine. Soft tissues and spinal canal: No prevertebral fluid or swelling. No visible canal hematoma. Right thyroidectomy is noted. Disc levels: Chronic endplate  degenerative changes are greatest at C3-4, C5-6 and C6-7. Facet spurring contributes 2 bilateral foraminal stenosis at C4-5. No focal osseous lesions are present. Upper chest: The lung apices are clear. The thoracic inlet is within normal limits. IMPRESSION: 1. No acute fracture or traumatic subluxation. 2. Chronic non healed fracture in the right posterior arch of C1. 3. Remote oblique healed fracture in the body of C2 on the left. 4. Chronic degenerative changes of the cervical spine as described. Electronically Signed   By: Audree Leas M.D.   On: 10/02/2023 19:01   DG Chest Port 1 View Result Date: 10/02/2023 CLINICAL DATA:  Fall 10 days ago.  Altered mental status. EXAM: PORTABLE CHEST 1 VIEW COMPARISON:  06/24/2023. FINDINGS: Stable cardiomediastinal silhouette. Aortic atherosclerosis. No focal consolidation, sizeable pleural effusion, or appreciable pneumothorax. Mildly displaced fracture of the left lateral eighth rib, likely acute to subacute. Remote healed fracture of the right proximal humerus. IMPRESSION: Mildly displaced fracture of the left lateral eighth rib, likely acute to subacute. No appreciable pneumothorax. Electronically Signed   By: Mannie Seek M.D.   On: 10/02/2023 15:12   DG Pelvis Portable Result Date: 10/02/2023 CLINICAL DATA:  Fall 10 days ago with increasing weakness. EXAM: PORTABLE PELVIS 1-2 VIEWS COMPARISON:  09/23/2023 FINDINGS: Overlapping telemetry wires. No acute fracture or dislocation identified. Femoral heads are seated within the acetabula. Sacroiliac joints and pubic symphysis are anatomically aligned. Mild degenerative changes of the bilateral hips. Degenerative changes of the visualized lower lumbar spine. Vascular calcifications are noted. IMPRESSION: No acute osseous abnormality identified on single AP pelvic radiograph. Electronically Signed   By: Mannie Seek M.D.   On: 10/02/2023 15:08   Assessment/Plan Kimberly Webb is a 89 y.o. female with  medical history significant for for HTN, HLD, hypothyroidism, osteoporosis and arthritis who presented to the ED for evaluation of generalized weakness after recent fall and found to have AKI and hypercalcemia.  # Acute kidney injury - Elderly woman with recent failure to thrive and decreased p.o. intake after recent fall - Labs show significant decrease in kidney function but BUN/creatinine of 71/2.41 - This is in the setting of recent decreased p.o. intake and hypovolemia - Status post 2 L IV NS with improvement in creatinine to 1.90 - Give IV LR 1 L bolus followed by 100 cc/h - Avoid nephrotoxic agents - Trend renal function  # Hypercalcemia - Significant rise in calcium to 15.0 on admission with altered mental status on exam. - Has history of mild hypercalcemia with calcium  of 11.2 4 years ago - Hypercalcemia secondary to acute kidney injury - Improved to 14.1 with IV hydration - Continue aggressive IV hydration as above - Will hold off IV bisphosphonate until CrCl > 35 - Start subcu calcitonin twice daily for 4 doses - Trend renal function and calcium  # Rib fractures # Recent fall # Generalized weakness - Patient with a reported for 10 days ago with negative workup at [facility] - Trauma scan shows acute minimally displaced fractures of the L 3rd-7th ribs - Patient with no significant pain or tenderness to palpation of the ribs. - Trauma surgery signed off - Pain control with lidocaine  patch, scheduled Tylenol , as needed IV Dilaudid  - PT/OT eval and treat - Fall precautions  # Osteoporosis - On Prolia  every 6 months - Vitamin D  levels within normal limits - Continue multivitamin  # HTN - Hold BP meds for now  # HLD - Continue simvastatin  and omega-3 fatty acid  # Hypothyroidism - TSH and free T4 within normal limits -Continue levothyroxine   DVT prophylaxis: Lovenox      Code Status: Full Code  Consults called: Trauma surgery  Family Communication: Discussed  admission with daughter at bedside  Severity of Illness: The appropriate patient status for this patient is INPATIENT. Inpatient status is judged to be reasonable and necessary in order to provide the required intensity of service to ensure the patient's safety. The patient's presenting symptoms, physical exam findings, and initial radiographic and laboratory data in the context of their chronic comorbidities is felt to place them at high risk for further clinical deterioration. Furthermore, it is not anticipated that the patient will be medically stable for discharge from the hospital within 2 midnights of admission.   * I certify that at the point of admission it is my clinical judgment that the patient will require inpatient hospital care spanning beyond 2 midnights from the point of admission due to high intensity of service, high risk for further deterioration and high frequency of surveillance required.*  Level of care: Telemetry Medical   This record has been created using Conservation officer, historic buildings. Errors have been sought and corrected, but may not always be located. Such creation errors do not reflect on the standard of care.   Vita Grip, MD 10/02/2023, 9:31 PM [facility] Pager: [phone] Isaiah 41:10   If 7PM-7AM, please contact night-coverage [email or web address] Password [email or web address]

## 2023-10-02 NOTE — ED Provider Notes (Signed)
  Physical Exam  BP (!) 152/75   Pulse 72   Temp 98.3 F (36.8 C) (Oral)   Resp 19   Ht 5\' 2"  (1.575 m)   Wt 65.8 kg   SpO2 94%   BMI 26.52 kg/m   Physical Exam  Procedures  Procedures  ED Course / MDM    Medical Decision Making Care assumed at 4 PM.  Patient had a fall about a week ago.  Went to [facility] and had negative x-rays and normal CT head and neck.  Patient had poor appetite and also trouble walking due to pain since then.  Signout pending labs and trauma scan.  7:52 PM Labs showed acute renal failure with creatinine of 2.4.  Calcium is 15.  Patient's hypercalcemia likely secondary to AKI.  Patient was given IV fluids for hypercalcemia and AKI.  Trauma scan reviewed by me and patient has acute left anterior third to 7 rib fractures.  Patient has no obvious bony lesions suggestive of multiple myeloma.  Consulted trauma surgery, Dr. Davonna Estes. He will see patient and recommend hospitalist admission for AKI and hypercalcemia.   Problems Addressed: AKI (acute kidney injury) (HCC): acute illness or injury Failure to thrive in adult: acute illness or injury Hypercalcemia: acute illness or injury  Amount and/or Complexity of Data Reviewed Labs: ordered. Decision-making details documented in ED Course. Radiology: ordered and independent interpretation performed. Decision-making details documented in ED Course. ECG/medicine tests: ordered and independent interpretation performed. Decision-making details documented in ED Course.    CRITICAL CARE Performed by: Florette Hurry   Total critical care time: 39 minutes  Critical care time was exclusive of separately billable procedures and treating other patients.  Critical care was necessary to treat or prevent imminent or life-threatening deterioration.  Critical care was time spent personally by me on the following activities: development of treatment plan with patient and/or surrogate as well as nursing, discussions  with consultants, evaluation of patient's response to treatment, examination of patient, obtaining history from patient or surrogate, ordering and performing treatments and interventions, ordering and review of laboratory studies, ordering and review of radiographic studies, pulse oximetry and re-evaluation of patient's condition.       Dalene Duck, MD 10/02/23 [phone]

## 2023-10-02 NOTE — Consult Note (Signed)
 Kimberly Webb 10-May-1933  [id number].    Requesting MD: Delana Favors Chief Complaint/Reason for Consult: Fall, Rib Fractures  HPI:  89 y/o F w/ a hx of OA and hypothyroidism who presented to the ED 10 days after she sustained a fall. She initially presented to an OSH on 4/21 where a CXR was performed and did not show a fracture.  Her daughter reports that prior to that event the patient was walking independently and communicating without issues. Since then she has developed weakness to the point that her son had to pick her up to put her in bed.  Given the ongoing weakness, the patient was brought to the ED.  Imaging revealed left 3-7th rib fx as well as remote fx of C1 and C2 and chronic c-spine changes.  Labs were notable for a Cr 2.2, Ca > 15, Hb 13.9  On exam, patient is resting in bed and appears somewhat confused.  She denies pain.   ROS: Review of Systems  Constitutional: Negative.   HENT: Negative.    Eyes: Negative.   Respiratory: Negative.    Cardiovascular: Negative.   Gastrointestinal: Negative.   Genitourinary: Negative.   Musculoskeletal:        Chest tenderness  Skin: Negative.   Neurological: Negative.   Endo/Heme/Allergies: Negative.   Psychiatric/Behavioral: Negative.      Family History  Problem Relation Age of Onset   Heart disease Mother    Hypertension Father    Hypertension Sister    Leukemia Sister    Heart disease Brother    Multiple sclerosis Brother    Parkinsonism Brother     Past Medical History:  Diagnosis Date   Arthritis    Atrophic vaginitis    Broken collarbone    Broken neck (HCC)    Broken ribs    Cystocele    Elevated serum creatinine    decreased GFR   Fibroid    Hypercholesteremia    Hypertension    Hypothyroidism    Osteoporosis    Paratubal cyst    Rectocele    STD (sexually transmitted disease)    HSV   Uterine prolapse     Past Surgical History:  Procedure Laterality Date   BROKEN ANKLE     BROKEN FEMUR      CATARACT EXTRACTION Bilateral    FOOT SURGERY Right    TOE   HERNIA REPAIR  2009   OOPHORECTOMY     BSO WITH VAG HYST IN 2004   REPLACEMENT TOTAL KNEE  2011   LEFT X 2   THYROIDECTOMY, PARTIAL     VAGINAL HYSTERECTOMY  2004   VAG HYST, BSO, A&P REPAIR    Social History:  reports that she has quit smoking. She has never used smokeless tobacco. She reports that she does not drink alcohol and does not use drugs.  Allergies: No Known Allergies  (Not in a hospital admission)   Physical Exam: Blood pressure (!) 152/75, pulse 72, temperature 98.3 F (36.8 C), temperature source Oral, resp. rate 19, height 5\' 2"  (1.575 m), weight 65.8 kg, SpO2 94%. Gen: elderly female, NAD HEENT: atraumatic Resp: equal chest rise, no crepitus, non-TTP CV: HDS, HR 90s Abd: soft, non-distended, non-tender Neuro: Moving all extremities Back: no stepoffs  Results for orders placed or performed during the hospital encounter of 10/02/23 (from the past 48 hours)  Rapid urine drug screen (hospital performed)     Status: None   Collection Time: 10/02/23  2:29 PM  Result Value Ref Range   Opiates NONE DETECTED NONE DETECTED   Cocaine NONE DETECTED NONE DETECTED   Benzodiazepines NONE DETECTED NONE DETECTED   Amphetamines NONE DETECTED NONE DETECTED   Tetrahydrocannabinol NONE DETECTED NONE DETECTED   Barbiturates NONE DETECTED NONE DETECTED    Comment: (NOTE) DRUG SCREEN FOR MEDICAL PURPOSES ONLY.  IF CONFIRMATION IS NEEDED FOR ANY PURPOSE, NOTIFY LAB WITHIN 5 DAYS.  LOWEST DETECTABLE LIMITS FOR URINE DRUG SCREEN Drug Class                     Cutoff (ng/mL) Amphetamine and metabolites    1000 Barbiturate and metabolites    200 Benzodiazepine                 200 Opiates and metabolites        300 Cocaine and metabolites        300 THC                            50 Performed at [facility], [address], [place], [place] [address]   Ammonia     Status: None   Collection Time:  10/02/23  2:33 PM  Result Value Ref Range   Ammonia 17 9 - 35 umol/L    Comment: Performed at [facility], [address] [place], [place] [address]  Ethanol     Status: None   Collection Time: 10/02/23  2:33 PM  Result Value Ref Range   Alcohol, Ethyl (B) <15 <15 mg/dL    Comment: Please note change in reference range. (NOTE) For medical purposes only. Performed at [facility], [address], [place], [place] [address]   TSH     Status: None   Collection Time: 10/02/23  2:33 PM  Result Value Ref Range   TSH 1.569 0.350 - 4.500 uIU/mL    Comment: Performed by a 3rd Generation assay with a functional sensitivity of <=0.01 uIU/mL. Performed at [facility], [address], [place], [place] [address]   Troponin I (High Sensitivity)     Status: Abnormal   Collection Time: 10/02/23  2:33 PM  Result Value Ref Range   Troponin I (High Sensitivity) 32 (H) <18 ng/L    Comment: (NOTE) Elevated high sensitivity troponin I (hsTnI) values and significant  changes across serial measurements may suggest ACS but many other  chronic and acute conditions are known to elevate hsTnI results.  Refer to the "Links" section for chest pain algorithms and additional  guidance. Performed at [facility] Lab, [address] [place], [place] [address]   CBG monitoring, ED     Status: Abnormal   Collection Time: 10/02/23  2:41 PM  Result Value Ref Range   Glucose-Capillary 126 (H) 70 - 99 mg/dL    Comment: Glucose reference range applies only to samples taken after fasting for at least 8 hours.  Comprehensive metabolic panel     Status: Abnormal   Collection Time: 10/02/23  2:43 PM  Result Value Ref Range   Sodium 142 135 - 145 mmol/L   Potassium 4.6 3.5 - 5.1 mmol/L   Chloride 109 98 - 111 mmol/L   CO2 23 22 - 32 mmol/L   Glucose, Bld 122 (H) 70 - 99 mg/dL    Comment: Glucose reference range applies only to samples taken after fasting for at least 8 hours.   BUN  71 (H) 8  - 23 mg/dL   Creatinine, Ser 1.61 (H) 0.44 - 1.00 mg/dL   Calcium 09.6 (HH) 8.9 - 10.3 mg/dL    Comment: ATTEMPTED TO CALL @ 1616 ATTEMPTED TO CALL @ 1623 CRITICAL RESULT CALLED TO, READ BACK BY AND VERIFIED WITH Darris Emery, RN @ [phone] 10/02/23 BY SEKDAHL    Total Protein 7.8 6.5 - 8.1 g/dL   Albumin 4.3 3.5 - 5.0 g/dL   AST 28 15 - 41 U/L   ALT 16 0 - 44 U/L   Alkaline Phosphatase 43 38 - 126 U/L   Total Bilirubin 1.0 0.0 - 1.2 mg/dL   GFR, Estimated 19 (L) >60 mL/min    Comment: (NOTE) Calculated using the CKD-EPI Creatinine Equation (2021)    Anion gap 10 5 - 15    Comment: Performed at [facility] Lab, [address], [place], [place] [address]  CBC with Differential/Platelet     Status: Abnormal   Collection Time: 10/02/23  2:43 PM  Result Value Ref Range   WBC 14.5 (H) 4.0 - 10.5 K/uL   RBC 4.12 3.87 - 5.11 MIL/uL   Hemoglobin 13.7 12.0 - 15.0 g/dL   HCT 91.4 78.2 - 95.6 %   MCV 101.5 (H) 80.0 - 100.0 fL   MCH 33.3 26.0 - 34.0 pg   MCHC 32.8 30.0 - 36.0 g/dL   RDW 21.3 08.6 - 57.8 %   Platelets 251 150 - 400 K/uL    Comment: REPEATED TO VERIFY   nRBC 0.0 0.0 - 0.2 %   Neutrophils Relative % 74 %   Neutro Abs 10.9 (H) 1.7 - 7.7 K/uL   Lymphocytes Relative 15 %   Lymphs Abs 2.2 0.7 - 4.0 K/uL   Monocytes Relative 7 %   Monocytes Absolute 1.1 (H) 0.1 - 1.0 K/uL   Eosinophils Relative 2 %   Eosinophils Absolute 0.2 0.0 - 0.5 K/uL   Basophils Relative 1 %   Basophils Absolute 0.1 0.0 - 0.1 K/uL   Immature Granulocytes 1 %   Abs Immature Granulocytes 0.07 0.00 - 0.07 K/uL    Comment: Performed at [facility] Lab, [address], [place], [place] [address]  T4, free     Status: None   Collection Time: 10/02/23  2:43 PM  Result Value Ref Range   Free T4 1.10 0.61 - 1.12 ng/dL    Comment: (NOTE) Biotin ingestion may interfere with free T4 tests. If the results are inconsistent with the TSH level, previous test results, or the clinical presentation, then consider  biotin interference. If needed, order repeat testing after stopping biotin. Performed at [facility] Lab, [address], [place], [place] [address]   I-Stat Lactic Acid, ED     Status: None   Collection Time: 10/02/23  3:05 PM  Result Value Ref Range   Lactic Acid, Venous 1.6 0.5 - 1.9 mmol/L  I-Stat venous blood gas, ED     Status: Abnormal   Collection Time: 10/02/23  3:05 PM  Result Value Ref Range   pH, Ven 7.405 7.25 - 7.43   pCO2, Ven 39.9 (L) 44 - 60 mmHg   pO2, Ven 36 32 - 45 mmHg   Bicarbonate 25.0 20.0 - 28.0 mmol/L   TCO2 26 22 - 32 mmol/L   O2 Saturation 69 %   Acid-Base Excess 0.0 0.0 - 2.0 mmol/L   Sodium 142 135 - 145 mmol/L   Potassium 4.7 3.5 - 5.1 mmol/L  Calcium, Ion 1.87 (HH) 1.15 - 1.40 mmol/L   HCT 41.0 36.0 - 46.0 %   Hemoglobin 13.9 12.0 - 15.0 g/dL   Sample type VENOUS    Comment NOTIFIED PHYSICIAN    CT CHEST ABDOMEN PELVIS WO CONTRAST Result Date: 10/02/2023 CLINICAL DATA:  Blunt polytrauma. Patient fell 10 days ago. Bilateral weakness. Patient declined from walking with a walker to being wheelchair-bound over the last 10 days. EXAM: CT CHEST, ABDOMEN AND PELVIS WITHOUT CONTRAST TECHNIQUE: Multidetector CT imaging of the chest, abdomen and pelvis was performed following the standard protocol without IV contrast. RADIATION DOSE REDUCTION: This exam was performed according to the departmental dose-optimization program which includes automated exposure control, adjustment of the mA and/or kV according to patient size and/or use of iterative reconstruction technique. COMPARISON:  Same day chest and pelvis radiographs FINDINGS: CT CHEST FINDINGS Cardiovascular: No pericardial effusion. Coronary artery and aortic atherosclerotic calcification. Evaluation for aortic injury is limited without IV contrast. Mediastinum/Nodes: Trachea and esophagus are unremarkable. No mediastinal hematoma. Lungs/Pleura: Respiratory motion obscures detail. Hypoventilation changes in  the lower lobes. Bilateral lower lobe scarring. No pleural effusion or pneumothorax. Musculoskeletal: Acute minimally displaced fractures of the left anterior 3rd-7th ribs. Displaced fracture of the left lateral ninth rib. Subacute or chronic fracture of the posterior left tenth rib. CT ABDOMEN PELVIS FINDINGS Hepatobiliary: Gallbladder sludge. No evidence of acute cholecystitis. Unremarkable noncontrast appearance of the liver. Pancreas: No acute abnormality. Spleen: Unremarkable. Adrenals/Urinary Tract: No adrenal hemorrhage or renal injury identified. Bladder is unremarkable. Stomach/Bowel: Normal caliber large and small bowel. Colonic diverticulosis without diverticulitis. No bowel wall thickening. Stomach is within normal limits. Vascular/Lymphatic: Aortic atherosclerosis. No enlarged abdominal or pelvic lymph nodes. Reproductive: Hysterectomy. Other: No free intraperitoneal fluid or air. Musculoskeletal: No acute fracture. IMPRESSION: 1. Acute minimally displaced fractures of the left anterior 3rd-7th ribs. Displaced fracture of the left lateral 9th rib. 2. No evidence of acute traumatic injury in the abdomen or pelvis. 3. Aortic Atherosclerosis (ICD10-I70.0). Electronically Signed   By: Rozell Cornet M.D.   On: 10/02/2023 19:17   CT HEAD WO CONTRAST Result Date: 10/02/2023 CLINICAL DATA:  Mental status change, persistent or worsening. EXAM: CT HEAD WITHOUT CONTRAST TECHNIQUE: Contiguous axial images were obtained from the base of the skull through the vertex without intravenous contrast. RADIATION DOSE REDUCTION: This exam was performed according to the departmental dose-optimization program which includes automated exposure control, adjustment of the mA and/or kV according to patient size and/or use of iterative reconstruction technique. COMPARISON:  CT head without contrast 09/23/2023 at [facility] [employer]. FINDINGS: Brain: Remote lacunar infarcts are again noted within the basal ganglia and  thalami bilaterally. No acute infarct, hemorrhage, or mass lesion is present. Moderate atrophy and white matter disease is stable. The ventricles are of normal size. No significant extraaxial fluid collection is present. The brainstem and cerebellum are within normal limits. Midline structures are within normal limits. Vascular: No hyperdense vessel or unexpected calcification. Skull: Calvarium is intact. No focal lytic or blastic lesions are present. No significant extracranial soft tissue lesion is present. Sinuses/Orbits: The paranasal sinuses and mastoid air cells are clear. Bilateral lens replacements are noted. Globes and orbits are otherwise unremarkable. IMPRESSION: 1. No acute intracranial abnormality or significant interval change. 2. Remote lacunar infarcts of the basal ganglia and thalami bilaterally. 3. Stable atrophy and white matter disease. This likely reflects the sequela of chronic microvascular ischemia. Electronically Signed   By: Audree Leas M.D.   On: 10/02/2023  19:05   CT Cervical Spine Wo Contrast Result Date: 10/02/2023 CLINICAL DATA:  Ataxia. Cervical trauma. Fall 10 days ago. Patient has declined from walking with a walker to now wheelchair-bound. EXAM: CT CERVICAL SPINE WITHOUT CONTRAST TECHNIQUE: Multidetector CT imaging of the cervical spine was performed without intravenous contrast. Multiplanar CT image reconstructions were also generated. RADIATION DOSE REDUCTION: This exam was performed according to the departmental dose-optimization program which includes automated exposure control, adjustment of the mA and/or kV according to patient size and/or use of iterative reconstruction technique. COMPARISON:  CT of the cervical spine 01/11/2020 FINDINGS: Alignment: Slight degenerative anterolisthesis at C4-5 is similar to prior studies. Straightening of the normal cervical lordosis is present. Chronic leftward curvature of the lower cervical spine is noted. Skull base and  vertebrae: A chronic non healed fracture is present in the right posterior arch of C1. No additional C1 fracture is present. A remote oblique healed fracture is present in the body of C2 on the left. No acute fractures are present in the cervical spine. Soft tissues and spinal canal: No prevertebral fluid or swelling. No visible canal hematoma. Right thyroidectomy is noted. Disc levels: Chronic endplate degenerative changes are greatest at C3-4, C5-6 and C6-7. Facet spurring contributes 2 bilateral foraminal stenosis at C4-5. No focal osseous lesions are present. Upper chest: The lung apices are clear. The thoracic inlet is within normal limits. IMPRESSION: 1. No acute fracture or traumatic subluxation. 2. Chronic non healed fracture in the right posterior arch of C1. 3. Remote oblique healed fracture in the body of C2 on the left. 4. Chronic degenerative changes of the cervical spine as described. Electronically Signed   By: Audree Leas M.D.   On: 10/02/2023 19:01   DG Chest Port 1 View Result Date: 10/02/2023 CLINICAL DATA:  Fall 10 days ago.  Altered mental status. EXAM: PORTABLE CHEST 1 VIEW COMPARISON:  06/24/2023. FINDINGS: Stable cardiomediastinal silhouette. Aortic atherosclerosis. No focal consolidation, sizeable pleural effusion, or appreciable pneumothorax. Mildly displaced fracture of the left lateral eighth rib, likely acute to subacute. Remote healed fracture of the right proximal humerus. IMPRESSION: Mildly displaced fracture of the left lateral eighth rib, likely acute to subacute. No appreciable pneumothorax. Electronically Signed   By: Mannie Seek M.D.   On: 10/02/2023 15:12   DG Pelvis Portable Result Date: 10/02/2023 CLINICAL DATA:  Fall 10 days ago with increasing weakness. EXAM: PORTABLE PELVIS 1-2 VIEWS COMPARISON:  09/23/2023 FINDINGS: Overlapping telemetry wires. No acute fracture or dislocation identified. Femoral heads are seated within the acetabula. Sacroiliac joints  and pubic symphysis are anatomically aligned. Mild degenerative changes of the bilateral hips. Degenerative changes of the visualized lower lumbar spine. Vascular calcifications are noted. IMPRESSION: No acute osseous abnormality identified on single AP pelvic radiograph. Electronically Signed   By: Mannie Seek M.D.   On: 10/02/2023 15:08    Assessment/Plan 89 y/o F who presents 10 days after a fall  L 3-7th Rib fx - multimodal pain control, pulmonary toilet. She denies current complaints  Hypercalcemia AKI Generalized weakness  FEN - Regular VTE - Lovenox  ID - None  Dispo: admit to medicine for treatment and workup of her hypercalcemia and worsening weakness. Trauma will sign off given that she is asymptomatic from her rib fx and w/o evidence of additional trauma.    Trula Gable Surgery 10/02/2023, 7:31 PM Please see Amion for pager number during day hours 7:00am-4:30pm or 7:00am -11:30am on weekends

## 2023-10-03 DIAGNOSIS — S2242XA Multiple fractures of ribs, left side, initial encounter for closed fracture: Secondary | ICD-10-CM

## 2023-10-03 DIAGNOSIS — R627 Adult failure to thrive: Secondary | ICD-10-CM | POA: Diagnosis not present

## 2023-10-03 DIAGNOSIS — R531 Weakness: Secondary | ICD-10-CM

## 2023-10-03 DIAGNOSIS — N179 Acute kidney failure, unspecified: Secondary | ICD-10-CM | POA: Diagnosis not present

## 2023-10-03 LAB — CBC
HCT: 40 % (ref 36.0–46.0)
Hemoglobin: 13.3 g/dL (ref 12.0–15.0)
MCH: 33.5 pg (ref 26.0–34.0)
MCHC: 33.3 g/dL (ref 30.0–36.0)
MCV: 100.8 fL — ABNORMAL HIGH (ref 80.0–100.0)
Platelets: 230 10*3/uL (ref 150–400)
RBC: 3.97 MIL/uL (ref 3.87–5.11)
RDW: 12.6 % (ref 11.5–15.5)
WBC: 18.6 10*3/uL — ABNORMAL HIGH (ref 4.0–10.5)
nRBC: 0 % (ref 0.0–0.2)

## 2023-10-03 LAB — COMPREHENSIVE METABOLIC PANEL WITH GFR
ALT: 15 U/L (ref 0–44)
ALT: 16 U/L (ref 0–44)
AST: 31 U/L (ref 15–41)
AST: 27 U/L (ref 15–41)
Albumin: 3.9 g/dL (ref 3.5–5.0)
Albumin: 3.9 g/dL (ref 3.5–5.0)
Alkaline Phosphatase: 45 U/L (ref 38–126)
Alkaline Phosphatase: 43 U/L (ref 38–126)
Anion gap: 14 (ref 5–15)
Anion gap: 10 (ref 5–15)
BUN: 51 mg/dL — ABNORMAL HIGH (ref 8–23)
BUN: 40 mg/dL — ABNORMAL HIGH (ref 8–23)
CO2: 19 mmol/L — ABNORMAL LOW (ref 22–32)
CO2: 19 mmol/L — ABNORMAL LOW (ref 22–32)
Calcium: 13.2 mg/dL (ref 8.9–10.3)
Calcium: 12.6 mg/dL — ABNORMAL HIGH (ref 8.9–10.3)
Chloride: 110 mmol/L (ref 98–111)
Chloride: 112 mmol/L — ABNORMAL HIGH (ref 98–111)
Creatinine, Ser: 1.67 mg/dL — ABNORMAL HIGH (ref 0.44–1.00)
Creatinine, Ser: 1.39 mg/dL — ABNORMAL HIGH (ref 0.44–1.00)
GFR, Estimated: 29 mL/min — ABNORMAL LOW (ref >60)
GFR, Estimated: 36 mL/min — ABNORMAL LOW (ref >60)
Glucose, Bld: 116 mg/dL — ABNORMAL HIGH (ref 70–99)
Glucose, Bld: 117 mg/dL — ABNORMAL HIGH (ref 70–99)
Potassium: 4.4 mmol/L (ref 3.5–5.1)
Potassium: 4.4 mmol/L (ref 3.5–5.1)
Sodium: 143 mmol/L (ref 135–145)
Sodium: 141 mmol/L (ref 135–145)
Total Bilirubin: 1 mg/dL (ref 0.0–1.2)
Total Bilirubin: 0.9 mg/dL (ref 0.0–1.2)
Total Protein: 7.2 g/dL (ref 6.5–8.1)
Total Protein: 7.1 g/dL (ref 6.5–8.1)

## 2023-10-03 LAB — PHOSPHORUS: Phosphorus: 1.8 mg/dL — ABNORMAL LOW (ref 2.5–4.6)

## 2023-10-03 LAB — MAGNESIUM: Magnesium: 1.6 mg/dL — ABNORMAL LOW (ref 1.7–2.4)

## 2023-10-03 MED ORDER — TRAMADOL HCL 50 MG PO TABS
25.0000 mg | ORAL_TABLET | Freq: Four times a day (QID) | ORAL | Status: DC | PRN
  Administered 2023-10-04 – 2023-10-06 (×4): 25 mg via ORAL
  Filled 2023-10-03 (×4): qty 1

## 2023-10-03 MED ORDER — NIACIN 500 MG PO TABS
500.0000 mg | ORAL_TABLET | Freq: Every day | ORAL | Status: DC
  Administered 2023-10-03 – 2023-10-08 (×7): 500 mg via ORAL
  Filled 2023-10-03 (×7): qty 1

## 2023-10-03 MED ORDER — ACETAMINOPHEN 500 MG PO TABS
1000.0000 mg | ORAL_TABLET | Freq: Three times a day (TID) | ORAL | Status: DC
  Administered 2023-10-03 – 2023-10-08 (×16): 1000 mg via ORAL
  Filled 2023-10-03 (×18): qty 2

## 2023-10-03 MED ORDER — ADULT MULTIVITAMIN W/MINERALS CH
1.0000 | ORAL_TABLET | Freq: Every day | ORAL | Status: DC
  Administered 2023-10-03 – 2023-10-08 (×6): 1 via ORAL
  Filled 2023-10-03 (×6): qty 1

## 2023-10-03 MED ORDER — METHOCARBAMOL 500 MG PO TABS: 500.0000 mg | ORAL_TABLET | Freq: Three times a day (TID) | ORAL | Status: DC | PRN

## 2023-10-03 MED ORDER — ACYCLOVIR 400 MG PO TABS
400.0000 mg | ORAL_TABLET | Freq: Every day | ORAL | Status: DC
  Administered 2023-10-03 – 2023-10-08 (×6): 400 mg via ORAL
  Filled 2023-10-03 (×6): qty 1

## 2023-10-03 MED ORDER — LEVOTHYROXINE SODIUM 100 MCG PO TABS
100.0000 ug | ORAL_TABLET | Freq: Every day | ORAL | Status: DC
  Administered 2023-10-03 – 2023-10-08 (×5): 100 ug via ORAL
  Filled 2023-10-03 (×6): qty 1

## 2023-10-03 MED ORDER — SIMVASTATIN 20 MG PO TABS
40.0000 mg | ORAL_TABLET | Freq: Every day | ORAL | Status: DC
  Administered 2023-10-03 – 2023-10-08 (×7): 40 mg via ORAL
  Filled 2023-10-03 (×7): qty 2

## 2023-10-03 MED ORDER — ENSURE ENLIVE PO LIQD
237.0000 mL | Freq: Two times a day (BID) | ORAL | Status: DC
  Administered 2023-10-04 – 2023-10-08 (×8): 237 mL via ORAL

## 2023-10-03 MED ORDER — LIDOCAINE 5 % EX PTCH
1.0000 | MEDICATED_PATCH | Freq: Every day | CUTANEOUS | Status: DC
  Administered 2023-10-03 – 2023-10-08 (×7): 1 via TRANSDERMAL
  Filled 2023-10-03 (×7): qty 1

## 2023-10-03 MED ORDER — LACTATED RINGERS IV BOLUS
1000.0000 mL | Freq: Once | INTRAVENOUS | Status: AC
  Administered 2023-10-03: 1000 mL via INTRAVENOUS

## 2023-10-03 MED ORDER — OMEGA-3-ACID ETHYL ESTERS 1 G PO CAPS
1.0000 g | ORAL_CAPSULE | Freq: Every day | ORAL | Status: DC
  Administered 2023-10-05 – 2023-10-08 (×4): 1 g via ORAL
  Filled 2023-10-03 (×6): qty 1

## 2023-10-03 MED ORDER — HYDROMORPHONE HCL 1 MG/ML IJ SOLN: 0.5000 mg | Freq: Four times a day (QID) | INTRAMUSCULAR | Status: DC | PRN

## 2023-10-03 NOTE — Progress Notes (Signed)
 error

## 2023-10-03 NOTE — Evaluation (Signed)
 Physical Therapy Evaluation Patient Details Name: Kimberly Webb MRN: [id number] DOB: 1933/03/07 Today's Date: 10/03/2023  History of Present Illness  89 yo female presents to [facility] on 4/30 with progressive weakness s/p fall. Fall on 4/21, went to outside hospital with CXR negative for acute findings. Imaging this admission shows L 3-7 rib fxs, additional workup for AKI. PMH includes OA, HTN, osteoporosis,  Clinical Impression   Pt presents with debility, impaired balance with history of falls, poor activity tolerance, and AMS. Pt to benefit from acute PT to address deficits. Pt requiring max-total assist for bed mobility tasks, once sitting EOB pt requiring substantial posterior truncal support to maintain upright. Pt with involuntary jerking and tremors once EOB. Patient will benefit from continued inpatient follow up therapy, <3 hours/day. PT to progress mobility as tolerated, and will continue to follow acutely.          If plan is discharge home, recommend the following: Two people to help with walking and/or transfers;Two people to help with bathing/dressing/bathroom   Can travel by private vehicle   No    Equipment Recommendations None recommended by PT  Recommendations for Other Services       Functional Status Assessment Patient has had a recent decline in their functional status and demonstrates the ability to make significant improvements in function in a reasonable and predictable amount of time.     Precautions / Restrictions Precautions Precautions: Fall Precaution/Restrictions Comments: L rib fxs Restrictions Weight Bearing Restrictions Per Provider Order: No      Mobility  Bed Mobility Overal bed mobility: Needs Assistance Bed Mobility: Supine to Sit, Rolling, Sit to Supine Rolling: Max assist   Supine to sit: Max assist Sit to supine: Max assist   General bed mobility comments: assist for all aspects, pt able to assist in trunk raise/lower. max-total assist  for rolling towards R    Transfers                   General transfer comment: unable to attempt this date, jerking motions sitting EOB requiring PT truncal assist to remain upright    Ambulation/Gait                  Stairs            Wheelchair Mobility     Tilt Bed    Modified Rankin (Stroke Patients Only)       Balance Overall balance assessment: Needs assistance Sitting-balance support: No upper extremity supported, Feet supported Sitting balance-Leahy Scale: Poor   Postural control: Posterior lean                                   Pertinent Vitals/Pain Pain Assessment Pain Assessment: Faces Faces Pain Scale: Hurts even more Pain Location: generalized, pt unable to localize Pain Descriptors / Indicators: Discomfort, Grimacing, Guarding, Sore Pain Intervention(s): Monitored during session, Limited activity within patient's tolerance, Repositioned    Home Living Family/patient expects to be discharged to:: Private residence Living Arrangements: Spouse/significant other;Children (daughter) Available Help at Discharge: Friend(s);Family Type of Home: House Home Access: Ramped entrance       Home Layout: One level Home Equipment: Rollator (4 wheels);Wheelchair - Forensic psychologist (2 wheels);BSC/3in1;Cane - single point      Prior Function Prior Level of Function : Needs assist             Mobility Comments: pt lethargic; daughter stating  pt typically ambulatory with RW with "short steps because of RLE pain", but over the past 10 days pt has been getting worse with mobility. Pt had to be lifted into and out of bed for the past few days, by either pt's daughter or grandson ADLs Comments: pt typically indep, handles the household finances and her medications     Extremity/Trunk Assessment   Upper Extremity Assessment Upper Extremity Assessment: Defer to OT evaluation    Lower Extremity Assessment Lower Extremity  Assessment: Generalized weakness    Cervical / Trunk Assessment Cervical / Trunk Assessment: Normal  Communication   Communication Communication: No apparent difficulties    Cognition Arousal: Lethargic Behavior During Therapy: Flat affect   PT - Cognitive impairments: Difficult to assess Difficult to assess due to: Level of arousal                     PT - Cognition Comments: lethargic, [place] and is unable to state why she is in the hospital Following commands: Impaired Following commands impaired: Follows one step commands with increased time     Cueing Cueing Techniques: Gestural cues, Verbal cues     General Comments General comments (skin integrity, edema, etc.): redness noted near L axilla, suspect due to moisture. pt soiled in urine, requiring PT assist for linen change and clean up    Exercises     Assessment/Plan    PT Assessment Patient needs continued PT services  PT Problem List Decreased strength;Decreased mobility;Decreased safety awareness;Decreased activity tolerance;Decreased balance;Decreased knowledge of use of DME;Pain;Decreased cognition;Decreased skin integrity       PT Treatment Interventions DME instruction;Therapeutic activities;Gait training;Therapeutic exercise;Patient/family education;Balance training;Stair training;Functional mobility training;Neuromuscular re-education    PT Goals (Current goals can be found in the Care Plan section)  Acute Rehab PT Goals PT Goal Formulation: With patient Time For Goal Achievement: 10/17/23 Potential to Achieve Goals: Fair    Frequency Min 2X/week     Co-evaluation               AM-PAC PT "6 Clicks" Mobility  Outcome Measure Help needed turning from your back to your side while in a flat bed without using bedrails?: A Lot Help needed moving from lying on your back to sitting on the side of a flat bed without using bedrails?: Total Help needed moving to and from a  bed to a chair (including a wheelchair)?: Total Help needed standing up from a chair using your arms (e.g., wheelchair or bedside chair)?: Total Help needed to walk in hospital room?: Total Help needed climbing 3-5 steps with a railing? : Total 6 Click Score: 7    End of Session   Activity Tolerance: Patient limited by fatigue Patient left: in bed;with call bell/phone within reach;with bed alarm set;with family/visitor present Nurse Communication: Mobility status PT Visit Diagnosis: Other abnormalities of gait and mobility (R26.89);Muscle weakness (generalized) (M62.81);Adult, failure to thrive (R62.7)    Time: 1610-9604 PT Time Calculation (min) (ACUTE ONLY): 29 min   Charges:   PT Evaluation $PT Eval Low Complexity: 1 Low PT Treatments $Therapeutic Activity: 8-22 mins PT General Charges $$ ACUTE PT VISIT: 1 Visit         Shirlene Doughty, PT DPT Acute Rehabilitation Services Secure Chat Preferred  Office [phone]   Izabel Chim Cydney Draft 10/03/2023, 4:43 PM

## 2023-10-03 NOTE — Plan of Care (Signed)
  Problem: Health Behavior/Discharge Planning: Goal: Ability to manage health-related needs will improve Outcome: Progressing   Problem: Activity: Goal: Risk for activity intolerance will decrease Outcome: Progressing   Problem: Nutrition: Goal: Adequate nutrition will be maintained Outcome: Progressing   Problem: Coping: Goal: Level of anxiety will decrease Outcome: Progressing   Problem: Safety: Goal: Ability to remain free from injury will improve Outcome: Progressing

## 2023-10-03 NOTE — Progress Notes (Signed)
 Date and time results received: 10/03/23 0806 (use smartphrase ".now" to insert current time)  Test: Blood draw Critical Value: 13.2  Name of Provider Notified: Simpson Dubs  Orders Received? Or Actions Taken?: Actions Taken: MD aware and wants to continued fluids and give bolus at this time .

## 2023-10-03 NOTE — Hospital Course (Addendum)
 Patient is an 89 year old Caucasian female with a past medical history significant for HTN, HLD, hypothyroidism, osteoporosis and arthritis who presented to the ED for evaluation of generalized weakness after recent fall approximately 10 days ago. During that incidence he was evaluated at [facility] and had a negative trauma scan.   Since then Patient was initially able to ambulate with a walker even after the fall however over the last few days she has become significantly fatigued and weak and has had extremely poor p.o. intake. She was evaluated by PCP who advised her to come to the ED and trauma surgery evaluated her given her multiple rib fractures.  She is being admitted for failure to thrive, acute metabolic encephalopathy and acute kidney injury, UTI as well as hypercalcemia.  She remains on IV fluids and palliative care medicine has been consulted for goals of care discussion. Mentation is improving and Ca2+ is trending down and she is improving. U/A done and PT/OT recommending SNF.   Assessment and Plan:  Failure to Thrive: Had poor p.o. intake last week after her recent fall.  See workup as below.  PT/OT recommending SNF. Getting IV fluid hydration as below.  Nutritionist Consulted;  SLP evaluation given that she had trouble difficulty swallowing her pills and now on D1 Thin Liquid diet. UDS negative and TSH WNL. Checked U/A and Urine Cx as below. Palliative consulted for goals of care discussion as well and plan for meeting 5/4. Head CT and Cervical Spine CT done and no acute intracranial abnormalities or significant interval change but did show remote lacunar infarcts in the basal ganglia and thalamic bilaterally and along with stable atrophy and white matter disease likely reflecting sequela of chronic microvascular ischemia.  If her electrolytes are corrected and calcium improves will consider EEG and MRI of the brain if she remains altered however she is clearer.    Acute Metabolic  Encephalopathy: In the setting of HyperCa2+ and improving.  C/w IVF hydration as below; Calcitonin is complete.  TSH was 1.569. If electrolytes are corrected and still encephalopathic will consider other etiologies of AMS and will obtain MRI of the brain and EEG. Will need outpatient Neurological evaluation and Neuropsychiatric testing. Checked U/A and Urine Cx and as below; UDS Negative  E Coli, Proteus, and Aerococcus UTI, poA: UA showed a hazy appearance with moderate leukocytes, positive nitrites, rare bacteria, 0-5 RBCs per high-power field, 0-5 squamous epithelial cells and 11-20 WBCs urine culture done and showed >100,000 CFU of E Coli and 10,000 CFU of Proteus Mirabilis and Now >100,000 CFU of Aerococcus Species with Cx pending. C/w IV Ceftriaxone and WBC is trending down and still mildly elevated at 13.6  AKI / Metabolic Acidosis: Improving Has had recent failure of breath with decreased p.o. intake after recent fall; BUN/Cr Trend went from 71/2.41 -> 23/1.06 -Status post 2 L IV NS and 2 L  LR bolus followed by mIVF with LR at 100 mL/hr; Now being continued on NS @ 75 mL/hr with 500 mL Bolus to be given today; MA has improved with a CO2 of 22, AG of 8, Chloride Level of 113 -Check U/A as below Urine Cx and still pending.  If necessary will obtain urine osmolality, urine sodium, urine creatinine and renal ultrasound -Avoid Nephrotoxic Medications, Contrast Dyes, Hypotension and Dehydration to Ensure Adequate Renal Perfusion and will need to Renally Adjust Med. CTM and Trend Renal Function carefully and repeat CMP in the AM    Hypercalcemia: Ca2+ of 15.0 on  admission with AMS on exam but does have Has history of mild HyperCa2+ with Ca2+ of 11.2 ~ 4 years ago; Hypercalcemia 2/2 to acute kidney injury. Improving with IVF hydration and changed to NS @ 75 mL/hr and will be continued for another 1 day at least. Will give her 500 mL bolus. Last Ca2+ is now 10.7.  Will hold off IV bisphosphonate until CrCl  > 35. S/p subcu calcitonin twice daily x 4 doses. Ionized Ca2+ was 1.87. CTM and Trend and repeat CMP in the AM   Leukocytosis with suspected UTI: Initially Worsening and now Trending down; WBC went from 14.5 -> 18.6 -> 16.0 -> 16.0 -> 13.5 -> 13.6. Check for Infection and U/A and Urine Cx not done on Admission so ordering now.  Urine showed a hazy appearance with moderate leukocytes, positive nitrites, rare bacteria, 0-5 RBCs per high-powered field, 0-5 squamous epithelial cells and 11-20 WBCs. Will empirically start IV Ceftriaxone pending her urine culture  Hypomagnesemia: Mag Level now 1.6. Replete with IV Mag Sulfate 2 grams. CTM and Replete as necessary. Repeat Mag Level in the AM  Hypophosphatemia: Phos Level now 1.8. Replete with IV K Phos 30 mmol. Repeat Phos Level in the AM  Dysphagia: Nursing reported some trouble swallowing Pills. SLP consulted.    Multiple Rib Fx in the setting of Recent fall /  Generalized Weakness / Physical Deconditioning and Debility: Patient with a reported for 10 days ago with negative workup at [facility]. Trauma scan shows acute minimally displaced fractures of the L 3rd-7th ribs as well as Displaced fracture of the left lateral 9th rib.  Patient with no significant pain or tenderness to palpation of the ribs. Trauma surgery signed off. C/w Pain control with lidocaine  patch, scheduled Tylenol , as needed IV Dilaudid . Incentive Spirometry. PT/OT eval and treat. Fall precautions   Osteoporosis: On Prolia  every 6 months. Vitamin D  level (74.72) WNL. C/w Multivitamin   Essential HTN: Hold BP meds for now. CTM BP per Protocol. Last BP reading was 160/67   HLD/Hypercholesteremia: C/w Simvastatin  40 mg po Daily and Omega-3 fatty acid   Hypothyroidism: TSH (1.569) and free T4 (1.10) WNL.C/w Levothyroxine  100 mcg if able to tolerate po  Normocytic Anemia: Hgb/Hct went from 13.3/40.0 -> 11.9/36.0 -> 11.9/35.4 -> 10.3/31.3 -> 10.4/32.6;Likely hemoconcentrated on admission. Check  Anemia Panel and showed an iron level of 38, UIBC 164, TIBC of 202, saturation of 19%, ferritin level 329, folate level of 19.2 and a vitamin B12 level of 340. CTM for S/Sx of Bleeding; No overt bleeding noted. Repeat CBC in the AM  Hyperbilirubinemia: Mild and Likely reactive. T Bili went from 1.5 (at its peak) -> 1.0 -> 0.3 -> 0.4.CTM and trend and repeat CMP in the AM   Overweight: Complicates overall prognosis and care. Estimated body mass index is 26.52 kg/m as calculated from the following:   Height as of this encounter: 5\' 2"  (1.575 m).   Weight as of this encounter: 65.8 kg. Weight Loss and Dietary Counseling given  Hypoalbuminemia: Patient's Albumin Level is now 3.0. CTM and Trend and repeat CMP in the AM  Severe Malnutrition in the Context of Acute illness/injury: Nutrition Status: Nutrition Problem: Severe Malnutrition Etiology: acute illness Signs/Symptoms: severe muscle depletion, moderate fat depletion, percent weight loss (15 lbs, 9% in 3 months.) Percent weight loss: 9 % Interventions: Refer to RD note for recommendations

## 2023-10-03 NOTE — Progress Notes (Addendum)
 PROGRESS NOTE    Kimberly Webb  UJW:[id number] DOB: 05-Jan-1933 DOA: 10/02/2023 PCP: Kimberly Webb, Kimberly Corning, MD (Inactive)   Brief Narrative:  Patient is an 89 year old Caucasian female with a past medical history significant for HTN, HLD, hypothyroidism, osteoporosis and arthritis who presented to the ED for evaluation of generalized weakness after recent fall approximately 10 days ago. During that incidence he was evaluated at [facility] and had a negative trauma scan.   Since then Patient was initially able to ambulate with a walker even after the fall however over the last few days she has become significantly fatigued and weak and has had extremely poor p.o. intake. She is evaluated by PCP who advised her to come to the ED and trauma surgery evaluated her given her multiple rib fractures.  She is being admitted for failure to thrive, acute metabolic encephalopathy and acute kidney injury as well as hypercalcemia.  She remains on IV fluids and palliative care medicine has been consulted for goals of care discussion  Assessment and Plan:  Failure to Thrive: Had poor p.o. intake last week after her recent fall.  See workup as below.  PT/OT to further evaluate and treat and recommending SNF. Getting IV fluid hydration as below.  Will need nutritionist consultation and SLP evaluation given that she had trouble difficulty swallowing her pills. UDS negative and TSH WNL. Palliative consulted for goals of care discussion as well. Head CT and Cervical Spine CT done and no acute intracranial abnormalities or significant interval change but did show remote lacunar infarcts in the basal ganglia and thalamic bilaterally and along with stable atrophy and white matter disease likely reflecting sequela of chronic microvascular ischemia.  If her electrolytes are corrected and calcium improves will consider EEG if still remains altered and MRI of the brain  Acute Metabolic Encephalopathy: In the setting of  HyperCa2+.  C/w IVF hydration as below and calcitonin.  If electrolytes are corrected and still encephalopathic will consider other etiologies of AMS and will obtain MRI of the brain and EEG  AKI / Metabolic Acidosis: Has had recent failure of breath with decreased p.o. intake after recent fall; BUN/Cr Trend: Recent Labs  Lab 10/02/23 1443 10/02/23 2257 10/03/23 0702  BUN 71* 63* 51*  CREATININE 2.41* 1.90* 1.67*  - Status post 2 L IV NS and 2 L  LR bolus followed by mIVF with LR at 100 mL/hr; Has a slight MA with a CO2 of 19, AG of 14, Chloride Level of 110 -Check U/A and Urine Cx.  If necessary will obtain urine osmolality, urine sodium, urine creatinine and renal ultrasound -Avoid Nephrotoxic Medications, Contrast Dyes, Hypotension and Dehydration to Ensure Adequate Renal Perfusion and will need to Renally Adjust Med. CTM and Trend Renal Function carefully and repeat CMP in the AM    Hypercalcemia: Ca2+ of15.0 on admission with AMS on exam but does have Has history of mild HyperCa2+ with Ca2+ of 11.2 ~ 4 years ago; Hypercalcemia 2/2 to acute kidney injury. Improving with IVF hydration and will Continue  aggressive IV hydration as above. Last Ca2+ is now 13.2.  Will hold off IV bisphosphonate until CrCl > 35.  Start subcu calcitonin twice daily x 4 doses. Ionized Ca2+ was 1.87. CTM and Trend and repeat Calcium in q12h  Leukocytosis: Worsening: Check for Infection and U/A and Urine Cx not done on Admission so ordering now.   Dysphagia: Nursing reported some trouble swallowing Pills. SLP consulted.    Multiple Rib Fx in the  setting of Recent fall /  Generalized Weakness / Physical Deconditioning and Debility: Patient with a reported for 10 days ago with negative workup at [facility]. Trauma scan shows acute minimally displaced fractures of the L 3rd-7th ribs as well as Displaced fracture of the left lateral 9th rib.  Patient with no significant pain or tenderness to palpation of the ribs. Trauma surgery  signed off. C/w Pain control with lidocaine  patch, scheduled Tylenol , as needed IV Dilaudid . Incentive Spirometry. PT/OT eval and treat. Fall precautions   Osteoporosis: On Prolia  every 6 months. Vitamin D  level (74.72) WNL. C/w Multivitamin   Essential HTN: Hold BP meds for now   HLD/Hypercholesteremia: C/w Simvastatin  40 mg po Daily and Omega-3 fatty acid   Hypothyroidism: TSH (1.569) and free T4 (1.10) WNL.C/w Levothyroxine  100 mcg if able to tolerate po   Overweight: Complicates overall prognosis and care. Estimated body mass index is 26.52 kg/m as calculated from the following:   Height as of this encounter: 5\' 2"  (1.575 m).   Weight as of this encounter: 65.8 kg. Weight Loss and Dietary Counseling given   DVT prophylaxis: enoxaparin  (LOVENOX ) injection 30 mg Start: 10/02/23 2115    Code Status: Full Code Family Communication: D/w Daughter Kimberly Webb over the Telephone  Disposition Plan:  Level of care: Telemetry Medical Status is: Inpatient Remains inpatient appropriate because: Needs further clinical improvement in Mentation and tolerance of po Diet   Consultants:  Palliative Care Medicine  Procedures:  As delineated as above  Antimicrobials:  Anti-infectives (From admission, onward)    Start     Dose/Rate Route Frequency Ordered Stop   10/03/23 1000  acyclovir  (ZOVIRAX ) tablet 400 mg        400 mg Oral Daily 10/03/23 0039         Subjective: Seen and examined at bedside and continues to remain confused.  Nursing states that she had a little bit difficulty and trouble swallowing her pills.  Calcium continues to still be very elevated.  Daughter states that she has had very poor p.o. intake last week or so.  No other concerns or complaints at this time.  Objective: Vitals:   10/02/23 2200 10/03/23 0159 10/03/23 0634 10/03/23 0756  BP: (!) 143/69 (!) 155/74 90/69 115/80  Pulse: 68 68 70 78  Resp: 18 18 20 16   Temp: 97.8 F (36.6 C) 98.6 F (37 C) 98.3 F (36.8 C)  97.6 F (36.4 C)  TempSrc: Axillary Oral Oral Oral  SpO2: 96% 97% 95% 94%  Weight:      Height:        Intake/Output Summary (Last 24 hours) at 10/03/2023 1716 Last data filed at 10/03/2023 0600 Gross per 24 hour  Intake 1060 ml  Output 100 ml  Net 960 ml   Filed Weights   10/02/23 1429  Weight: 65.8 kg   Examination: Physical Exam:  Constitutional: Elderly overweight Caucasian female who appears a little uncomfortable Respiratory: Diminished to auscultation bilaterally, no wheezing, rales, rhonchi or crackles. Normal respiratory effort and patient is not tachypenic. No accessory muscle use.  Unlabored breathing Cardiovascular: RRR, no murmurs / rubs / gallops. S1 and S2 auscultated. No appreciable extremity edema Abdomen: Soft, non-tender, distended secondary to body habitus. Bowel sounds positive.  GU: Deferred. Musculoskeletal: No clubbing / cyanosis of digits/nails. No joint deformity upper and lower extremities. Skin: No rashes, lesions, ulcers on limited skin evaluation. No induration; Warm and dry.  Neurologic: Has slight tremors and moves extremities independently but extremely confused Psychiatric: Impaired  judgment and insight. Awake but not fully alert and oriented  Data Reviewed: I have personally reviewed following labs and imaging studies  CBC: Recent Labs  Lab 10/02/23 1443 10/02/23 1505 10/03/23 0702  WBC 14.5*  --  18.6*  NEUTROABS 10.9*  --   --   HGB 13.7 13.9 13.3  HCT 41.8 41.0 40.0  MCV 101.5*  --  100.8*  PLT 251  --  230   Basic Metabolic Panel: Recent Labs  Lab 10/02/23 1443 10/02/23 1505 10/02/23 2257 10/03/23 0702  NA 142 142 146* 143  K 4.6 4.7 4.8 4.4  CL 109  --  113* 110  CO2 23  --  19* 19*  GLUCOSE 122*  --  92 116*  BUN 71*  --  63* 51*  CREATININE 2.41*  --  1.90* 1.67*  CALCIUM 15.0*  --  14.1* 13.2*   GFR: Estimated Creatinine Clearance: 20.7 mL/min (A) (by C-G formula based on SCr of 1.67 mg/dL (H)). Liver Function  Tests: Recent Labs  Lab 10/02/23 1443 10/03/23 0702  AST 28 31  ALT 16 15  ALKPHOS 43 45  BILITOT 1.0 1.0  PROT 7.8 7.2  ALBUMIN 4.3 3.9   No results for input(s): "LIPASE", "AMYLASE" in the last 168 hours. Recent Labs  Lab 10/02/23 1433  AMMONIA 17   Coagulation Profile: No results for input(s): "INR", "PROTIME" in the last 168 hours. Cardiac Enzymes: No results for input(s): "CKTOTAL", "CKMB", "CKMBINDEX", "TROPONINI" in the last 168 hours. BNP (last 3 results) No results for input(s): "PROBNP" in the last 8760 hours. HbA1C: No results for input(s): "HGBA1C" in the last 72 hours. CBG: Recent Labs  Lab 10/02/23 1441  GLUCAP 126*   Lipid Profile: No results for input(s): "CHOL", "HDL", "LDLCALC", "TRIG", "CHOLHDL", "LDLDIRECT" in the last 72 hours. Thyroid  Function Tests: Recent Labs    10/02/23 1433 10/02/23 1443  TSH 1.569  --   FREET4  --  1.10   Anemia Panel: No results for input(s): "VITAMINB12", "FOLATE", "FERRITIN", "TIBC", "IRON", "RETICCTPCT" in the last 72 hours. Sepsis Labs: Recent Labs  Lab 10/02/23 1505  LATICACIDVEN 1.6   No results found for this or any previous visit (from the past 240 hours).   Radiology Studies: CT CHEST ABDOMEN PELVIS WO CONTRAST Result Date: 10/02/2023 CLINICAL DATA:  Blunt polytrauma. Patient fell 10 days ago. Bilateral weakness. Patient declined from walking with a walker to being wheelchair-bound over the last 10 days. EXAM: CT CHEST, ABDOMEN AND PELVIS WITHOUT CONTRAST TECHNIQUE: Multidetector CT imaging of the chest, abdomen and pelvis was performed following the standard protocol without IV contrast. RADIATION DOSE REDUCTION: This exam was performed according to the departmental dose-optimization program which includes automated exposure control, adjustment of the mA and/or kV according to patient size and/or use of iterative reconstruction technique. COMPARISON:  Same day chest and pelvis radiographs FINDINGS: CT CHEST  FINDINGS Cardiovascular: No pericardial effusion. Coronary artery and aortic atherosclerotic calcification. Evaluation for aortic injury is limited without IV contrast. Mediastinum/Nodes: Trachea and esophagus are unremarkable. No mediastinal hematoma. Lungs/Pleura: Respiratory motion obscures detail. Hypoventilation changes in the lower lobes. Bilateral lower lobe scarring. No pleural effusion or pneumothorax. Musculoskeletal: Acute minimally displaced fractures of the left anterior 3rd-7th ribs. Displaced fracture of the left lateral ninth rib. Subacute or chronic fracture of the posterior left tenth rib. CT ABDOMEN PELVIS FINDINGS Hepatobiliary: Gallbladder sludge. No evidence of acute cholecystitis. Unremarkable noncontrast appearance of the liver. Pancreas: No acute abnormality. Spleen: Unremarkable. Adrenals/Urinary Tract: No  adrenal hemorrhage or renal injury identified. Bladder is unremarkable. Stomach/Bowel: Normal caliber large and small bowel. Colonic diverticulosis without diverticulitis. No bowel wall thickening. Stomach is within normal limits. Vascular/Lymphatic: Aortic atherosclerosis. No enlarged abdominal or pelvic lymph nodes. Reproductive: Hysterectomy. Other: No free intraperitoneal fluid or air. Musculoskeletal: No acute fracture. IMPRESSION: 1. Acute minimally displaced fractures of the left anterior 3rd-7th ribs. Displaced fracture of the left lateral 9th rib. 2. No evidence of acute traumatic injury in the abdomen or pelvis. 3. Aortic Atherosclerosis (ICD10-I70.0). Electronically Signed   By: Rozell Cornet M.D.   On: 10/02/2023 19:17   CT HEAD WO CONTRAST Result Date: 10/02/2023 CLINICAL DATA:  Mental status change, persistent or worsening. EXAM: CT HEAD WITHOUT CONTRAST TECHNIQUE: Contiguous axial images were obtained from the base of the skull through the vertex without intravenous contrast. RADIATION DOSE REDUCTION: This exam was performed according to the departmental  dose-optimization program which includes automated exposure control, adjustment of the mA and/or kV according to patient size and/or use of iterative reconstruction technique. COMPARISON:  CT head without contrast 09/23/2023 at [facility] [employer]. FINDINGS: Brain: Remote lacunar infarcts are again noted within the basal ganglia and thalami bilaterally. No acute infarct, hemorrhage, or mass lesion is present. Moderate atrophy and white matter disease is stable. The ventricles are of normal size. No significant extraaxial fluid collection is present. The brainstem and cerebellum are within normal limits. Midline structures are within normal limits. Vascular: No hyperdense vessel or unexpected calcification. Skull: Calvarium is intact. No focal lytic or blastic lesions are present. No significant extracranial soft tissue lesion is present. Sinuses/Orbits: The paranasal sinuses and mastoid air cells are clear. Bilateral lens replacements are noted. Globes and orbits are otherwise unremarkable. IMPRESSION: 1. No acute intracranial abnormality or significant interval change. 2. Remote lacunar infarcts of the basal ganglia and thalami bilaterally. 3. Stable atrophy and white matter disease. This likely reflects the sequela of chronic microvascular ischemia. Electronically Signed   By: Audree Leas M.D.   On: 10/02/2023 19:05   CT Cervical Spine Wo Contrast Result Date: 10/02/2023 CLINICAL DATA:  Ataxia. Cervical trauma. Fall 10 days ago. Patient has declined from walking with a walker to now wheelchair-bound. EXAM: CT CERVICAL SPINE WITHOUT CONTRAST TECHNIQUE: Multidetector CT imaging of the cervical spine was performed without intravenous contrast. Multiplanar CT image reconstructions were also generated. RADIATION DOSE REDUCTION: This exam was performed according to the departmental dose-optimization program which includes automated exposure control, adjustment of the mA and/or kV according to  patient size and/or use of iterative reconstruction technique. COMPARISON:  CT of the cervical spine 01/11/2020 FINDINGS: Alignment: Slight degenerative anterolisthesis at C4-5 is similar to prior studies. Straightening of the normal cervical lordosis is present. Chronic leftward curvature of the lower cervical spine is noted. Skull base and vertebrae: A chronic non healed fracture is present in the right posterior arch of C1. No additional C1 fracture is present. A remote oblique healed fracture is present in the body of C2 on the left. No acute fractures are present in the cervical spine. Soft tissues and spinal canal: No prevertebral fluid or swelling. No visible canal hematoma. Right thyroidectomy is noted. Disc levels: Chronic endplate degenerative changes are greatest at C3-4, C5-6 and C6-7. Facet spurring contributes 2 bilateral foraminal stenosis at C4-5. No focal osseous lesions are present. Upper chest: The lung apices are clear. The thoracic inlet is within normal limits. IMPRESSION: 1. No acute fracture or traumatic subluxation. 2. Chronic non healed fracture  in the right posterior arch of C1. 3. Remote oblique healed fracture in the body of C2 on the left. 4. Chronic degenerative changes of the cervical spine as described. Electronically Signed   By: Audree Leas M.D.   On: 10/02/2023 19:01   DG Chest Port 1 View Result Date: 10/02/2023 CLINICAL DATA:  Fall 10 days ago.  Altered mental status. EXAM: PORTABLE CHEST 1 VIEW COMPARISON:  06/24/2023. FINDINGS: Stable cardiomediastinal silhouette. Aortic atherosclerosis. No focal consolidation, sizeable pleural effusion, or appreciable pneumothorax. Mildly displaced fracture of the left lateral eighth rib, likely acute to subacute. Remote healed fracture of the right proximal humerus. IMPRESSION: Mildly displaced fracture of the left lateral eighth rib, likely acute to subacute. No appreciable pneumothorax. Electronically Signed   By: Mannie Seek M.D.   On: 10/02/2023 15:12   DG Pelvis Portable Result Date: 10/02/2023 CLINICAL DATA:  Fall 10 days ago with increasing weakness. EXAM: PORTABLE PELVIS 1-2 VIEWS COMPARISON:  09/23/2023 FINDINGS: Overlapping telemetry wires. No acute fracture or dislocation identified. Femoral heads are seated within the acetabula. Sacroiliac joints and pubic symphysis are anatomically aligned. Mild degenerative changes of the bilateral hips. Degenerative changes of the visualized lower lumbar spine. Vascular calcifications are noted. IMPRESSION: No acute osseous abnormality identified on single AP pelvic radiograph. Electronically Signed   By: Mannie Seek M.D.   On: 10/02/2023 15:08   Scheduled Meds:  acetaminophen   1,000 mg Oral Q8H   acyclovir   400 mg Oral Daily   calcitonin  4 Units/kg Subcutaneous BID   enoxaparin  (LOVENOX ) injection  30 mg Subcutaneous Q24H   levothyroxine   100 mcg Oral Q0600   lidocaine   1 patch Transdermal QHS   multivitamin with minerals  1 tablet Oral Daily   niacin   500 mg Oral QHS   omega-3 acid ethyl esters  1 g Oral Daily   simvastatin   40 mg Oral QHS   Continuous Infusions:  lactated ringers  100 mL/hr at 10/03/23 1512    LOS: 1 day   Aura Leeds, DO [facility] Available via Epic secure chat 7am-7pm After these hours, please refer to coverage provider listed on [email or web address] 10/03/2023, 5:16 PM

## 2023-10-04 DIAGNOSIS — R627 Adult failure to thrive: Secondary | ICD-10-CM | POA: Diagnosis not present

## 2023-10-04 DIAGNOSIS — E43 Unspecified severe protein-calorie malnutrition: Secondary | ICD-10-CM | POA: Insufficient documentation

## 2023-10-04 DIAGNOSIS — R531 Weakness: Secondary | ICD-10-CM | POA: Diagnosis not present

## 2023-10-04 DIAGNOSIS — N179 Acute kidney failure, unspecified: Secondary | ICD-10-CM | POA: Diagnosis not present

## 2023-10-04 LAB — CBC WITH DIFFERENTIAL/PLATELET
Abs Immature Granulocytes: 0.06 10*3/uL (ref 0.00–0.07)
Basophils Absolute: 0.1 10*3/uL (ref 0.0–0.1)
Basophils Relative: 1 %
Eosinophils Absolute: 0.3 10*3/uL (ref 0.0–0.5)
Eosinophils Relative: 2 %
HCT: 36 % (ref 36.0–46.0)
Hemoglobin: 11.9 g/dL — ABNORMAL LOW (ref 12.0–15.0)
Immature Granulocytes: 0 %
Lymphocytes Relative: 16 %
Lymphs Abs: 2.5 10*3/uL (ref 0.7–4.0)
MCH: 33 pg (ref 26.0–34.0)
MCHC: 33.1 g/dL (ref 30.0–36.0)
MCV: 99.7 fL (ref 80.0–100.0)
Monocytes Absolute: 1.1 10*3/uL — ABNORMAL HIGH (ref 0.1–1.0)
Monocytes Relative: 7 %
Neutro Abs: 11.9 10*3/uL — ABNORMAL HIGH (ref 1.7–7.7)
Neutrophils Relative %: 74 %
Platelets: 242 10*3/uL (ref 150–400)
RBC: 3.61 MIL/uL — ABNORMAL LOW (ref 3.87–5.11)
RDW: 12.7 % (ref 11.5–15.5)
WBC: 16 10*3/uL — ABNORMAL HIGH (ref 4.0–10.5)
nRBC: 0 % (ref 0.0–0.2)

## 2023-10-04 LAB — COMPREHENSIVE METABOLIC PANEL WITH GFR
ALT: 14 U/L (ref 0–44)
AST: 33 U/L (ref 15–41)
Albumin: 3.7 g/dL (ref 3.5–5.0)
Alkaline Phosphatase: 42 U/L (ref 38–126)
Anion gap: 13 (ref 5–15)
BUN: 29 mg/dL — ABNORMAL HIGH (ref 8–23)
CO2: 20 mmol/L — ABNORMAL LOW (ref 22–32)
Calcium: 12 mg/dL — ABNORMAL HIGH (ref 8.9–10.3)
Chloride: 109 mmol/L (ref 98–111)
Creatinine, Ser: 1.31 mg/dL — ABNORMAL HIGH (ref 0.44–1.00)
GFR, Estimated: 39 mL/min — ABNORMAL LOW (ref >60)
Glucose, Bld: 91 mg/dL (ref 70–99)
Potassium: 4.5 mmol/L (ref 3.5–5.1)
Sodium: 142 mmol/L (ref 135–145)
Total Bilirubin: 1.5 mg/dL — ABNORMAL HIGH (ref 0.0–1.2)
Total Protein: 6.7 g/dL (ref 6.5–8.1)

## 2023-10-04 LAB — URINALYSIS, ROUTINE W REFLEX MICROSCOPIC
Bilirubin Urine: NEGATIVE
Glucose, UA: NEGATIVE mg/dL
Hgb urine dipstick: NEGATIVE
Ketones, ur: 5 mg/dL — AB
Nitrite: POSITIVE — AB
Protein, ur: NEGATIVE mg/dL
Specific Gravity, Urine: 1.014 (ref 1.005–1.030)
pH: 5 (ref 5.0–8.0)

## 2023-10-04 LAB — MAGNESIUM: Magnesium: 1.6 mg/dL — ABNORMAL LOW (ref 1.7–2.4)

## 2023-10-04 LAB — PHOSPHORUS: Phosphorus: 1.6 mg/dL — ABNORMAL LOW (ref 2.5–4.6)

## 2023-10-04 MED ORDER — THIAMINE MONONITRATE 100 MG PO TABS
100.0000 mg | ORAL_TABLET | Freq: Every day | ORAL | Status: DC
  Administered 2023-10-04 – 2023-10-08 (×5): 100 mg via ORAL
  Filled 2023-10-04 (×5): qty 1

## 2023-10-04 MED ORDER — MAGNESIUM SULFATE 2 GM/50ML IV SOLN
2.0000 g | Freq: Once | INTRAVENOUS | Status: AC
  Administered 2023-10-04: 2 g via INTRAVENOUS
  Filled 2023-10-04: qty 50

## 2023-10-04 MED ORDER — LACTATED RINGERS IV SOLN: INTRAVENOUS | Status: DC

## 2023-10-04 MED ORDER — SODIUM CHLORIDE 0.9 % IV SOLN: INTRAVENOUS | Status: DC

## 2023-10-04 MED ORDER — SODIUM PHOSPHATES 45 MMOLE/15ML IV SOLN
30.0000 mmol | Freq: Once | INTRAVENOUS | Status: AC
  Administered 2023-10-04: 30 mmol via INTRAVENOUS
  Filled 2023-10-04: qty 10

## 2023-10-04 NOTE — Care Management Important Message (Signed)
 Important Message  Patient Details  Name: Kimberly Webb MRN: [id number] Date of Birth: [birth year]   Important Message Given:  Yes - Medicare IM     Felix Host 10/04/2023, 12:08 PM

## 2023-10-04 NOTE — Plan of Care (Signed)
  Problem: Clinical Measurements: Goal: Will remain free from infection Outcome: Progressing   Problem: Activity: Goal: Risk for activity intolerance will decrease Outcome: Progressing   Problem: Nutrition: Goal: Adequate nutrition will be maintained Outcome: Progressing   Problem: Coping: Goal: Level of anxiety will decrease Outcome: Progressing   Problem: Elimination: Goal: Will not experience complications related to bowel motility Outcome: Progressing   Problem: Safety: Goal: Ability to remain free from injury will improve Outcome: Progressing   Problem: Pain Managment: Goal: General experience of comfort will improve and/or be controlled Outcome: Progressing

## 2023-10-04 NOTE — Evaluation (Signed)
 Clinical/Bedside Swallow Evaluation Patient Details  Name: Kimberly Webb MRN: [id number] Date of Birth: [birth year]  Today's Date: 10/04/2023 Time: SLP Start Time (ACUTE ONLY): 1130 SLP Stop Time (ACUTE ONLY): 1210 SLP Time Calculation (min) (ACUTE ONLY): 40 min  Past Medical History:  Past Medical History:  Diagnosis Date   Arthritis    Atrophic vaginitis    Broken collarbone    Broken neck (HCC)    Broken ribs    Cystocele    Elevated serum creatinine    decreased GFR   Fibroid    Hypercholesteremia    Hypertension    Hypothyroidism    Osteoporosis    Paratubal cyst    Rectocele    STD (sexually transmitted disease)    HSV   Uterine prolapse    Past Surgical History:  Past Surgical History:  Procedure Laterality Date   BROKEN ANKLE     BROKEN FEMUR     CATARACT EXTRACTION Bilateral    FOOT SURGERY Right    TOE   HERNIA REPAIR  2009   OOPHORECTOMY     BSO WITH VAG HYST IN 2004   REPLACEMENT TOTAL KNEE  2011   LEFT X 2   THYROIDECTOMY, PARTIAL     VAGINAL HYSTERECTOMY  2004   VAG HYST, BSO, A&P REPAIR   HPI:  89 yo female presents to [facility] on 4/30 with progressive weakness s/p fall. Fall on 4/21, went to outside hospital with CXR negative for acute findings. Imaging this admission shows L 3-7 rib fxs, additional workup for AKI. PMH includes OA, HTN, osteoporosis,    Assessment / Plan / Recommendation  Clinical Impression  Pt demonstrates a cognitive based oral dysphagia. Daughter reports decreased intake prior and eating mostly yogurts and cereal softened with milk at home. Daughter states pt's memory has been decreased and describes gradual decline in cognition/function. Dentition is intact with decreased hygiene and noted to have lingual candidias. No oromotor weakness. Daughter stated she coughed when eating pot roast yesterday and RN tech reported coughing during breakfast with eggs/hashbrowns and one sip juice. Daughter denies GERD or history of esophageal  issues. Throughout session she consumed thin liquid via straw without s/s aspiration. Mastication of regular texture resulted in significantly prolonged chewing of over 5 minutes for one bite. Decreased bolus cohesion with diffuse residue in oral cavity that she was unable to remove with lingual sweep and minimal amount remained after lingual wash. Daughter in agreement to downgrade texture to puree, continue thin, crush pills, check oral cavity for pocketed food and full supervision/assist with meals- encourage self feeding if pt able. ST will follow up. SLP Visit Diagnosis: Dysphagia, oral phase (R13.11)    Aspiration Risk  Mild aspiration risk    Diet Recommendation Dysphagia 1 (Puree);Thin liquid    Liquid Administration via: Straw;Cup Medication Administration: Crushed with puree Supervision: Staff to assist with self feeding;Full supervision/cueing for compensatory strategies Compensations: Slow rate;Small sips/bites;Lingual sweep for clearance of pocketing Postural Changes: Seated upright at 90 degrees    Other  Recommendations Oral Care Recommendations: Oral care BID    Recommendations for follow up therapy are one component of a multi-disciplinary discharge planning process, led by the attending physician.  Recommendations may be updated based on patient status, additional functional criteria and insurance authorization.  Follow up Recommendations Skilled nursing-short term rehab (<3 hours/day)      Assistance Recommended at Discharge    Functional Status Assessment Patient has had a recent decline in their functional status and  demonstrates the ability to make significant improvements in function in a reasonable and predictable amount of time.  Frequency and Duration min 2x/week  2 weeks       Prognosis Prognosis for improved oropharyngeal function: Fair Barriers to Reach Goals: Cognitive deficits      Swallow Study   General Date of Onset: 10/02/23 HPI: 89 yo female  presents [facility] on 4/[facility] with progressive weakness s/p fall. Fall on 4/21, went to outside hospital with CXR negative for acute findings. Imaging this admission shows L 3-7 rib fxs, additional workup for AKI. PMH includes OA, HTN, osteoporosis, Type of Study: Bedside Swallow Evaluation Previous Swallow Assessment:  (none) Diet Prior to this Study: Regular;Thin liquids (Level 0) Temperature Spikes Noted: No Respiratory Status: Room air History of Recent Intubation: No Behavior/Cognition: Alert;Cooperative;Pleasant mood;Confused;Requires cueing Oral Cavity Assessment: Other (comment) (lingual candidia and xerostomia) Oral Care Completed by SLP: No Oral Cavity - Dentition: Adequate natural dentition Vision: Functional for self-feeding Self-Feeding Abilities: Needs assist Patient Positioning: Upright in bed Baseline Vocal Quality: Normal    Oral/Motor/Sensory Function Overall Oral Motor/Sensory Function: Within functional limits   Ice Chips Ice chips: Not tested   Thin Liquid Thin Liquid: Within functional limits Presentation: Cup;Straw    Nectar Thick Nectar Thick Liquid: Not tested   Honey Thick Honey Thick Liquid: Not tested   Puree Puree: Within functional limits   Solid     Solid: Impaired Oral Phase Functional Implications: Oral residue;Prolonged oral transit (prolonged mastication) Pharyngeal Phase Impairments:  (none)      Markham Dumlao, Nola Battiest 10/04/2023,1:15 PM

## 2023-10-04 NOTE — TOC Progression Note (Addendum)
 Transition of Care [facility]) - Progression Note    Patient Details  Name: Kimberly Webb MRN: [id number] Date of Birth: [birth year]  [facility] of [facility] CM/SW Contact  Katrinka Parr, [place] Phone Number: 10/04/2023, 2:08 PM  Clinical Narrative:     CSW called and left voicemail with pt's daughter requesting return call to discuss SNF.       Social Determinants of Health (SDOH) Interventions SDOH Screenings   Utilities: Patient Unable To Answer (10/02/2023)  Social Connections: Unknown (10/03/2023)  Tobacco Use: Medium Risk (10/02/2023)    Readmission Risk Interventions     No data to display

## 2023-10-04 NOTE — Progress Notes (Signed)
 PROGRESS NOTE    Kimberly Webb  BMW:[id number] DOB: Jun 10, 1932 DOA: 10/02/2023 PCP: Brenita Callow, L.Rozelle Corning, MD (Inactive)   Brief Narrative:  Patient is an 89 year old Caucasian female with a past medical history significant for HTN, HLD, hypothyroidism, osteoporosis and arthritis who presented to the ED for evaluation of generalized weakness after recent fall approximately 10 days ago. During that incidence he was evaluated at [facility] and had a negative trauma scan.   Since then Patient was initially able to ambulate with a walker even after the fall however over the last few days she has become significantly fatigued and weak and has had extremely poor p.o. intake. She is evaluated by PCP who advised her to come to the ED and trauma surgery evaluated her given her multiple rib fractures.  She is being admitted for failure to thrive, acute metabolic encephalopathy and acute kidney injury as well as hypercalcemia.  She remains on IV fluids and palliative care medicine has been consulted for goals of care discussion. Mentation is improving and Ca2+ is trending down and she is improving.   Assessment and Plan:  Failure to Thrive: Had poor p.o. intake last week after her recent fall.  See workup as below.  PT/OT to further evaluate and treat and recommending SNF. Getting IV fluid hydration as below.  Nutritionist Consulted;  SLP evaluation given that she had trouble difficulty swallowing her pills and now on D1 Thin Liquid diet. UDS negative and TSH WNL. Checking U/A and Urine Cx. Palliative consulted for goals of care discussion as well. Head CT and Cervical Spine CT done and no acute intracranial abnormalities or significant interval change but did show remote lacunar infarcts in the basal ganglia and thalamic bilaterally and along with stable atrophy and white matter disease likely reflecting sequela of chronic microvascular ischemia.  If her electrolytes are corrected and calcium improves will  consider EEG and MRI of the brain if she remains altered.   Acute Metabolic Encephalopathy: In the setting of HyperCa2+ and improving.  C/w IVF hydration as below and calcitonin.  If electrolytes are corrected and still encephalopathic will consider other etiologies of AMS and will obtain MRI of the brain and EEG. Will need outpatient Neurological evaluation and Neuropsychiatric testing  AKI / Metabolic Acidosis: Has had recent failure of breath with decreased p.o. intake after recent fall; BUN/Cr Trend: Recent Labs  Lab 10/02/23 1443 10/02/23 2257 10/03/23 0702 10/03/23 1552 10/04/23 0456  BUN 71* 63* 51* 40* 29*  CREATININE 2.41* 1.90* 1.67* 1.39* 1.31*  -Status post 2 L IV NS and 2 L  LR bolus followed by mIVF with LR at 100 mL/hr; Now being changed to NS @ 75 mL/hr; Has a slight MA with a CO2 of 20, AG of 13, Chloride Level of 109 -Check U/A and Urine Cx and still pending.  If necessary will obtain urine osmolality, urine sodium, urine creatinine and renal ultrasound -Avoid Nephrotoxic Medications, Contrast Dyes, Hypotension and Dehydration to Ensure Adequate Renal Perfusion and will need to Renally Adjust Med. CTM and Trend Renal Function carefully and repeat CMP in the AM    Hypercalcemia: Ca2+ of15.0 on admission with AMS on exam but does have Has history of mild HyperCa2+ with Ca2+ of 11.2 ~ 4 years ago; Hypercalcemia 2/2 to acute kidney injury. Improving with IVF hydration and changed to NS @ 75 mL/hr. Last Ca2+ is now 12.0.  Will hold off IV bisphosphonate until CrCl > 35. S/p ubcu calcitonin twice daily x 4 doses.  Ionized Ca2+ was 1.87. CTM and Trend and repeat Calcium in q12h  Leukocytosis: Initially Worsening and now Trending down; WBC went from 14.5 -> 18.6 -> 16.0. Check for Infection and U/A and Urine Cx not done on Admission so ordering now but still not sent.   Hypomagnesemia: Mag Level 1.6. Replete with IV Mag Sulfate 2 grams. CTM and Replete as necessary. Repeat Mag Level in  the AM  Hypophosphatemia: Phos Level 1.6; Replete with IV NaPhos 30 mmol. CTM and Replete as Necessary. Repeat Phos Level in the AM  Dysphagia: Nursing reported some trouble swallowing Pills. SLP consulted.    Multiple Rib Fx in the setting of Recent fall /  Generalized Weakness / Physical Deconditioning and Debility: Patient with a reported for 10 days ago with negative workup at [facility]. Trauma scan shows acute minimally displaced fractures of the L 3rd-7th ribs as well as Displaced fracture of the left lateral 9th rib.  Patient with no significant pain or tenderness to palpation of the ribs. Trauma surgery signed off. C/w Pain control with lidocaine  patch, scheduled Tylenol , as needed IV Dilaudid . Incentive Spirometry. PT/OT eval and treat. Fall precautions   Osteoporosis: On Prolia  every 6 months. Vitamin D  level (74.72) WNL. C/w Multivitamin   Essential HTN: Hold BP meds for now. CTM BP per Protocol. Last BP reading was 149/74   HLD/Hypercholesteremia: C/w Simvastatin  40 mg po Daily and Omega-3 fatty acid   Hypothyroidism: TSH (1.569) and free T4 (1.10) WNL.C/w Levothyroxine  100 mcg if able to tolerate po  Normocytic Anemia: Hgb/Hct went from 13.3/40.0 -> 11.9/36.0;Likely hemoconcentrated on admission. Check Anemia Panel in the Am  Hyperbilirubinemia: Mild and Likely reactive. T Bili went from 1.0 -> 0.9 -> 1.5.CTM and trend and repeat CMP in the AM   Overweight: Complicates overall prognosis and care. Estimated body mass index is 26.52 kg/m as calculated from the following:   Height as of this encounter: 5\' 2"  (1.575 m).   Weight as of this encounter: 65.8 kg. Weight Loss and Dietary Counseling given  Severe Malnutrition in the Context of Acute illness/injury: Nutrition Status: Nutrition Problem: Severe Malnutrition Etiology: acute illness Signs/Symptoms: severe muscle depletion, moderate fat depletion, percent weight loss (15 lbs, 9% in 3 months.) Percent weight loss: 9  % Interventions: Refer to RD note for recommendations   DVT prophylaxis: enoxaparin  (LOVENOX ) injection 30 mg Start: 10/02/23 2115    Code Status: Full Code Family Communication: D/w Daughter @ Bedside  Disposition Plan:  Level of care: Telemetry Medical Status is: Inpatient Remains inpatient appropriate because: Needs further IVF Hydration to further correct Calcium and SNF bed availability   Consultants:  Palliative Care Medicine   Procedures:  As delineated as above  Antimicrobials:  Anti-infectives (From admission, onward)    Start     Dose/Rate Route Frequency Ordered Stop   10/03/23 1000  acyclovir  (ZOVIRAX ) tablet 400 mg        400 mg Oral Daily 10/03/23 0039         Subjective: Seen and examined at bedside she is much more awake and alert and interactive.  Does not think she is in the hospital but answered all the other orientation questions appropriately.  Complaining of some pain now in her back.  No other concerns or complaints at this time.  Objective: Vitals:   10/03/23 1717 10/03/23 2030 10/04/23 0427 10/04/23 0810  BP: (!) 145/132 (!) 173/72 (!) 149/64 (!) 149/74  Pulse: (!) 55 (!) 54 62 62  Resp: 17 16  16 16  Temp: 98 F (36.7 C) 98 F (36.7 C) 97.7 F (36.5 C) 97.7 F (36.5 C)  TempSrc:  Oral Oral Oral  SpO2: 93% 97% 97% 95%  Weight:      Height:        Intake/Output Summary (Last 24 hours) at 10/04/2023 1513 Last data filed at 10/04/2023 1300 Gross per 24 hour  Intake 1861.13 ml  Output 650 ml  Net 1211.13 ml   Filed Weights   10/02/23 1429  Weight: 65.8 kg   Examination: Physical Exam:  Constitutional: Elderly overweight Caucasian female who appears more awake and alert slightly uncomfortable Respiratory: Diminished to auscultation bilaterally, no wheezing, rales, rhonchi or crackles. Normal respiratory effort and patient is not tachypenic. No accessory muscle use.  Unlabored breathing Cardiovascular: RRR, no murmurs / rubs / gallops. S1  and S2 auscultated. No extremity edema.   Abdomen: Soft, non-tender, non-distended. Bowel sounds positive.  GU: Deferred. Musculoskeletal: No clubbing / cyanosis of digits/nails. No joint deformity upper and lower extremities. Neurologic: CN 2-12 grossly intact with no focal deficits. Romberg sign cerebellar reflexes not assessed.  Psychiatric: Normal judgment and insight. Alert and oriented and able to answer all orientation questions except where she is and think she is in a hotel.  Data Reviewed: I have personally reviewed following labs and imaging studies  CBC: Recent Labs  Lab 10/02/23 1443 10/02/23 1505 10/03/23 0702 10/04/23 0456  WBC 14.5*  --  18.6* 16.0*  NEUTROABS 10.9*  --   --  11.9*  HGB 13.7 13.9 13.3 11.9*  HCT 41.8 41.0 40.0 36.0  MCV 101.5*  --  100.8* 99.7  PLT 251  --  230 242   Basic Metabolic Panel: Recent Labs  Lab 10/02/23 1443 10/02/23 1505 10/02/23 2257 10/03/23 0702 10/03/23 1552 10/04/23 0456  NA 142 142 146* 143 141 142  K 4.6 4.7 4.8 4.4 4.4 4.5  CL 109  --  113* 110 112* 109  CO2 23  --  19* 19* 19* 20*  GLUCOSE 122*  --  92 116* 117* 91  BUN 71*  --  63* 51* 40* 29*  CREATININE 2.41*  --  1.90* 1.67* 1.39* 1.31*  CALCIUM 15.0*  --  14.1* 13.2* 12.6* 12.0*  MG  --   --   --   --  1.6* 1.6*  PHOS  --   --   --   --  1.8* 1.6*   GFR: Estimated Creatinine Clearance: 26.4 mL/min (A) (by C-G formula based on SCr of 1.31 mg/dL (H)). Liver Function Tests: Recent Labs  Lab 10/02/23 1443 10/03/23 0702 10/03/23 1552 10/04/23 0456  AST 28 31 27  33  ALT 16 15 16 14   ALKPHOS 43 45 43 42  BILITOT 1.0 1.0 0.9 1.5*  PROT 7.8 7.2 7.1 6.7  ALBUMIN 4.3 3.9 3.9 3.7   No results for input(s): "LIPASE", "AMYLASE" in the last 168 hours. Recent Labs  Lab 10/02/23 1433  AMMONIA 17   Coagulation Profile: No results for input(s): "INR", "PROTIME" in the last 168 hours. Cardiac Enzymes: No results for input(s): "CKTOTAL", "CKMB", "CKMBINDEX",  "TROPONINI" in the last 168 hours. BNP (last 3 results) No results for input(s): "PROBNP" in the last 8760 hours. HbA1C: No results for input(s): "HGBA1C" in the last 72 hours. CBG: Recent Labs  Lab 10/02/23 1441  GLUCAP 126*   Lipid Profile: No results for input(s): "CHOL", "HDL", "LDLCALC", "TRIG", "CHOLHDL", "LDLDIRECT" in the last 72 hours. Thyroid  Function Tests: Recent  Labs    10/02/23 1433 10/02/23 1443  TSH 1.569  --   FREET4  --  1.10   Anemia Panel: No results for input(s): "VITAMINB12", "FOLATE", "FERRITIN", "TIBC", "IRON", "RETICCTPCT" in the last 72 hours. Sepsis Labs: Recent Labs  Lab 10/02/23 1505  LATICACIDVEN 1.6   No results found for this or any previous visit (from the past 240 hours).   Radiology Studies: CT CHEST ABDOMEN PELVIS WO CONTRAST Result Date: 10/02/2023 CLINICAL DATA:  Blunt polytrauma. Patient fell 10 days ago. Bilateral weakness. Patient declined from walking with a walker to being wheelchair-bound over the last 10 days. EXAM: CT CHEST, ABDOMEN AND PELVIS WITHOUT CONTRAST TECHNIQUE: Multidetector CT imaging of the chest, abdomen and pelvis was performed following the standard protocol without IV contrast. RADIATION DOSE REDUCTION: This exam was performed according to the departmental dose-optimization program which includes automated exposure control, adjustment of the mA and/or kV according to patient size and/or use of iterative reconstruction technique. COMPARISON:  Same day chest and pelvis radiographs FINDINGS: CT CHEST FINDINGS Cardiovascular: No pericardial effusion. Coronary artery and aortic atherosclerotic calcification. Evaluation for aortic injury is limited without IV contrast. Mediastinum/Nodes: Trachea and esophagus are unremarkable. No mediastinal hematoma. Lungs/Pleura: Respiratory motion obscures detail. Hypoventilation changes in the lower lobes. Bilateral lower lobe scarring. No pleural effusion or pneumothorax. Musculoskeletal:  Acute minimally displaced fractures of the left anterior 3rd-7th ribs. Displaced fracture of the left lateral ninth rib. Subacute or chronic fracture of the posterior left tenth rib. CT ABDOMEN PELVIS FINDINGS Hepatobiliary: Gallbladder sludge. No evidence of acute cholecystitis. Unremarkable noncontrast appearance of the liver. Pancreas: No acute abnormality. Spleen: Unremarkable. Adrenals/Urinary Tract: No adrenal hemorrhage or renal injury identified. Bladder is unremarkable. Stomach/Bowel: Normal caliber large and small bowel. Colonic diverticulosis without diverticulitis. No bowel wall thickening. Stomach is within normal limits. Vascular/Lymphatic: Aortic atherosclerosis. No enlarged abdominal or pelvic lymph nodes. Reproductive: Hysterectomy. Other: No free intraperitoneal fluid or air. Musculoskeletal: No acute fracture. IMPRESSION: 1. Acute minimally displaced fractures of the left anterior 3rd-7th ribs. Displaced fracture of the left lateral 9th rib. 2. No evidence of acute traumatic injury in the abdomen or pelvis. 3. Aortic Atherosclerosis (ICD10-I70.0). Electronically Signed   By: Rozell Cornet M.D.   On: 10/02/2023 19:17   CT HEAD WO CONTRAST Result Date: 10/02/2023 CLINICAL DATA:  Mental status change, persistent or worsening. EXAM: CT HEAD WITHOUT CONTRAST TECHNIQUE: Contiguous axial images were obtained from the base of the skull through the vertex without intravenous contrast. RADIATION DOSE REDUCTION: This exam was performed according to the departmental dose-optimization program which includes automated exposure control, adjustment of the mA and/or kV according to patient size and/or use of iterative reconstruction technique. COMPARISON:  CT head without contrast 09/23/2023 at [facility] [employer]. FINDINGS: Brain: Remote lacunar infarcts are again noted within the basal ganglia and thalami bilaterally. No acute infarct, hemorrhage, or mass lesion is present. Moderate atrophy and  white matter disease is stable. The ventricles are of normal size. No significant extraaxial fluid collection is present. The brainstem and cerebellum are within normal limits. Midline structures are within normal limits. Vascular: No hyperdense vessel or unexpected calcification. Skull: Calvarium is intact. No focal lytic or blastic lesions are present. No significant extracranial soft tissue lesion is present. Sinuses/Orbits: The paranasal sinuses and mastoid air cells are clear. Bilateral lens replacements are noted. Globes and orbits are otherwise unremarkable. IMPRESSION: 1. No acute intracranial abnormality or significant interval change. 2. Remote lacunar infarcts of the basal ganglia  and thalami bilaterally. 3. Stable atrophy and white matter disease. This likely reflects the sequela of chronic microvascular ischemia. Electronically Signed   By: Audree Leas M.D.   On: 10/02/2023 19:05   CT Cervical Spine Wo Contrast Result Date: 10/02/2023 CLINICAL DATA:  Ataxia. Cervical trauma. Fall 10 days ago. Patient has declined from walking with a walker to now wheelchair-bound. EXAM: CT CERVICAL SPINE WITHOUT CONTRAST TECHNIQUE: Multidetector CT imaging of the cervical spine was performed without intravenous contrast. Multiplanar CT image reconstructions were also generated. RADIATION DOSE REDUCTION: This exam was performed according to the departmental dose-optimization program which includes automated exposure control, adjustment of the mA and/or kV according to patient size and/or use of iterative reconstruction technique. COMPARISON:  CT of the cervical spine 01/11/2020 FINDINGS: Alignment: Slight degenerative anterolisthesis at C4-5 is similar to prior studies. Straightening of the normal cervical lordosis is present. Chronic leftward curvature of the lower cervical spine is noted. Skull base and vertebrae: A chronic non healed fracture is present in the right posterior arch of C1. No additional C1  fracture is present. A remote oblique healed fracture is present in the body of C2 on the left. No acute fractures are present in the cervical spine. Soft tissues and spinal canal: No prevertebral fluid or swelling. No visible canal hematoma. Right thyroidectomy is noted. Disc levels: Chronic endplate degenerative changes are greatest at C3-4, C5-6 and C6-7. Facet spurring contributes 2 bilateral foraminal stenosis at C4-5. No focal osseous lesions are present. Upper chest: The lung apices are clear. The thoracic inlet is within normal limits. IMPRESSION: 1. No acute fracture or traumatic subluxation. 2. Chronic non healed fracture in the right posterior arch of C1. 3. Remote oblique healed fracture in the body of C2 on the left. 4. Chronic degenerative changes of the cervical spine as described. Electronically Signed   By: Audree Leas M.D.   On: 10/02/2023 19:01   Scheduled Meds:  acetaminophen   1,000 mg Oral Q8H   acyclovir   400 mg Oral Daily   enoxaparin  (LOVENOX ) injection  30 mg Subcutaneous Q24H   feeding supplement  237 mL Oral BID BM   levothyroxine   100 mcg Oral Q0600   lidocaine   1 patch Transdermal QHS   multivitamin with minerals  1 tablet Oral Daily   niacin   500 mg Oral QHS   omega-3 acid ethyl esters  1 g Oral Daily   simvastatin   40 mg Oral QHS   thiamine   100 mg Oral Daily   Continuous Infusions:  sodium chloride      sodium PHOSPHATE IVPB (in mmol) 30 mmol (10/04/23 1154)    LOS: 2 days   Aura Leeds, DO [facility] Available via Epic secure chat 7am-7pm After these hours, please refer to coverage provider listed on [email or web address] 10/04/2023, 3:13 PM

## 2023-10-04 NOTE — Progress Notes (Addendum)
 Initial Nutrition Assessment  DOCUMENTATION CODES:   Severe malnutrition in context of acute illness/injury  INTERVENTION:  Encourage PO intake Diet per SLP House trays until AMS improves Feeding assistance  Ensure Enlive po BID, each supplement provides 350 kcal and 20 grams of protein. Magic cup TID with meals, each supplement provides 290 kcal and 9 grams of protein - Prefers wild berry  100 mg Thiamine  x 7 days Monitor magnesium , potassium, and phosphorus BID for at least 3 days, MD to replete as needed, as pt is at risk for refeeding syndrome  Continue MVI with minerals daily   NUTRITION DIAGNOSIS:   Severe Malnutrition related to acute illness as evidenced by severe muscle depletion, moderate fat depletion, percent weight loss (15 lbs, 9% in 3 months.).   GOAL:   Patient will meet greater than or equal to 90% of their needs   MONITOR:   PO intake, Supplement acceptance, Labs, I & O's  REASON FOR ASSESSMENT:   Consult Assessment of nutrition requirement/status  ASSESSMENT:  89 y.o female presented with generalized weakness after recent fall. Admitted for FTT, acute metabolic encephalopathy, AKI, and hypercalcemia. PMH of HTN, HLD, hypothyroidism, osteoporosis.  Patient is a poor historian, attempted to call daughter with no answer. History obtained through chart review.   Patient had a fall 10 days PTA and was able to ambulate until recently where she became significantly weaker and was unable to ambulate by herself. She also had decreased PO intake during this time. Per nursing patient has a hard time swallowing pills and SLP was consulted. On visit, RD provided sips of Ensure through a straw with no signs of coughing. RN also reports no signs of coughing while giving crushed meds.   No breakfast tray at bedside. Will place patient on house trays and nursing to provide assistance with feeding. No meals documented since admission. On exam patient presents with fluid in  lower and upper extremities which made it difficult to access wasting. Even so, severe muscle and moderate fat wasting noted. Reviewed weight history and patient has lost 15 lbs, 9% in 3 months. Patient is severely malnourished and Mag and Phos trending low. Is at risk of refeeding.  Addendum:SLP conducted bedside swallow evaluation and patient placed on Dysphagia 1, thin liquids.  Admit weight: 65.8 kg  Current weight: 65.8 kg    Average Meal Intake: No meals documented   Nutritionally Relevant Medications: Scheduled Meds:  acetaminophen   1,000 mg Oral Q8H   acyclovir   400 mg Oral Daily   enoxaparin  (LOVENOX ) injection  30 mg Subcutaneous Q24H   feeding supplement  237 mL Oral BID BM   levothyroxine   100 mcg Oral Q0600   lidocaine   1 patch Transdermal QHS   multivitamin with minerals  1 tablet Oral Daily   niacin   500 mg Oral QHS   omega-3 acid ethyl esters  1 g Oral Daily   simvastatin   40 mg Oral QHS   thiamine   100 mg Oral Daily   Continuous Infusions:  sodium chloride  75 mL/hr at 10/04/23 1527   sodium PHOSPHATE IVPB (in mmol) 30 mmol (10/04/23 1154)   Labs Reviewed: BUN 29, Creatinine 1.31, Calcium 12, Phosphorus 1.6, Magnesium  1.6, Total bilirubin, GFR 39,  No recent CBG No A1C   NUTRITION - FOCUSED PHYSICAL EXAM:  Flowsheet Row Most Recent Value  Orbital Region Moderate depletion  Upper Arm Region Mild depletion  [Fluid]  Thoracic and Lumbar Region Moderate depletion  Buccal Region Moderate depletion  [employer]  Moderate depletion  Clavicle Bone Region Moderate depletion  Clavicle and Acromion Bone Region Severe depletion  Scapular Bone Region Severe depletion  Dorsal Hand Moderate depletion  Patellar Region Unable to assess  [Fluid]  Anterior Thigh Region Unable to assess  [Fluid]  Posterior Calf Region Unable to assess  [Fluid]  Edema (RD Assessment) Moderate  Hair Reviewed  Eyes Reviewed  Mouth Reviewed  Skin Reviewed  Nails Reviewed       Diet  Order:   Diet Order             DIET - DYS 1 Room service appropriate? No; Fluid consistency: Thin  Diet effective now                   EDUCATION NEEDS:   Not appropriate for education at this time  Skin:  Skin Assessment: Reviewed RN Assessment  Last BM:  PTA  Height:   Ht Readings from Last 1 Encounters:  10/02/23 5\' 2"  (1.575 m)    Weight:   Wt Readings from Last 1 Encounters:  10/02/23 65.8 kg    Ideal Body Weight:  50 kg  BMI:  Body mass index is 26.52 kg/m.  Estimated Nutritional Needs:   Kcal:  1500-1700 kcal  Protein:  70-90 gm  Fluid:  >2.5L/day  Frederik Jansky, RD Registered Dietitian  See Amion for more information

## 2023-10-04 NOTE — Evaluation (Signed)
 Occupational Therapy Evaluation Patient Details Name: Kimberly Webb MRN: [id number] DOB: March 25, 1933 Today's Date: 10/04/2023   History of Present Illness   89 yo female presents to [facility] on 4/30 with progressive weakness s/p fall. Fall on 4/21, went to outside hospital with CXR negative for acute findings. Imaging this admission shows L 3-7 rib fxs, additional workup for AKI. PMH includes OA, HTN, osteoporosis,     Clinical Impressions Patient lives with her daughter and grandson in a 1 level house with ramp entry and at baseline is using a cane for functional mobility and Independent in ADLs and reliant on daughter for most IADLs..Since fall 10 days ago, patient has required increased A for functional mobility and ADLs.  Patient  currently requires max A/total A for LB ADLs and max a for supine<>sit EOB.  Patient would benefit  SNF placement for additional OT intervention to address functional deficits of safety, fall prevention and safety and independence with ADLs along with UE strength.      If plan is discharge home, recommend the following:   A lot of help with bathing/dressing/bathroom;Assistance with cooking/housework;Assist for transportation;Direct supervision/assist for medications management;A lot of help with walking and/or transfers;Help with stairs or ramp for entrance     Functional Status Assessment   Patient has had a recent decline in their functional status and demonstrates the ability to make significant improvements in function in a reasonable and predictable amount of time.     Equipment Recommendations         Recommendations for Other Services         Precautions/Restrictions   Precautions Precautions: Fall Precaution/Restrictions Comments: L rib fxs Restrictions Weight Bearing Restrictions Per Provider Order: No     Mobility Bed Mobility Overal bed mobility: Needs Assistance Bed Mobility: Supine to Sit, Rolling, Sit to Supine Rolling: Max  assist   Supine to sit: Max assist Sit to supine: Max assist        Transfers                   General transfer comment:  (jerking movements noted while sitting EOB which required CGA to prevent patient from pitching forward)      Balance Overall balance assessment: Needs assistance Sitting-balance support: No upper extremity supported, Feet supported Sitting balance-Leahy Scale: Fair                                     ADL either performed or assessed with clinical judgement   ADL Overall ADL's : Needs assistance/impaired Eating/Feeding: Set up;Sitting   Grooming: Wash/dry face;Wash/dry hands;Contact guard assist;Sitting   Upper Body Bathing: Minimal assistance;Sitting   Lower Body Bathing: Maximal assistance   Upper Body Dressing : Moderate assistance;Sitting   Lower Body Dressing: Maximal assistance               Functional mobility during ADLs: Maximal assistance (for supine to sit; patient refusing additional mobility at this time)       Vision Baseline Vision/History: 1 Wears glasses Patient Visual Report: No change from baseline       Perception         Praxis         Pertinent Vitals/Pain Pain Assessment Pain Assessment: No/denies pain Faces Pain Scale: No hurt Breathing: normal Negative Vocalization: none Facial Expression: smiling or inexpressive Body Language: relaxed Consolability: no need to console PAINAD Score: 0 Pain Intervention(s):  Monitored during session     Extremity/Trunk Assessment Upper Extremity Assessment Upper Extremity Assessment: Generalized weakness   Lower Extremity Assessment Lower Extremity Assessment: Defer to PT evaluation   Cervical / Trunk Assessment Cervical / Trunk Assessment: Normal   Communication Communication Communication: No apparent difficulties   Cognition   Behavior During Therapy: Flat affect Cognition: Cognition impaired (daughter reporting that patient has  increased confusion and then moments of clarity recently)   Orientation impairments: Person, Place, Situation     Attention impairment (select first level of impairment): Focused attention                     Following commands: Impaired Following commands impaired: Follows one step commands with increased time     Cueing  General Comments   Cueing Techniques: Verbal cues;Gestural cues      Exercises     Shoulder Instructions      Home Living Family/patient expects to be discharged to:: Skilled nursing facility Living Arrangements: Children Available Help at Discharge: Friend(s);Family Type of Home: House Home Access: Ramped entrance     Home Layout: One level     Bathroom Shower/Tub: Chief Strategy Officer: Handicapped height     Home Equipment: Rollator (4 wheels);Wheelchair - Forensic psychologist (2 wheels);BSC/3in1;Cane - single point          Prior Functioning/Environment Prior Level of Function : Needs assist       Physical Assist : Mobility (physical);ADLs (physical) Mobility (physical): Bed mobility;Transfers;Gait ADLs (physical): Dressing;Toileting Mobility Comments:  (patient's daughter reporting that patient as baseline is using a cane or RW for functional mobility althoughrecently sicce fall 10 days ago, has required more assistance) ADLs Comments: pt typically indep, handles the household finances and her medications    OT Problem List: Decreased strength;Impaired balance (sitting and/or standing);Decreased safety awareness;Decreased knowledge of use of DME or AE;Decreased activity tolerance   OT Treatment/Interventions: Self-care/ADL training;Therapeutic exercise;DME and/or AE instruction;Therapeutic activities;Patient/family education      OT Goals(Current goals can be found in the care plan section)   Acute Rehab OT Goals OT Goal Formulation: With patient Time For Goal Achievement: 10/18/23 Potential to Achieve  Goals: Good   OT Frequency:  Min 1X/week    Co-evaluation              AM-PAC OT "6 Clicks" Daily Activity     Outcome Measure Help from another person eating meals?: A Little Help from another person taking care of personal grooming?: A Little Help from another person toileting, which includes using toliet, bedpan, or urinal?: Total Help from another person bathing (including washing, rinsing, drying)?: A Lot Help from another person to put on and taking off regular upper body clothing?: A Lot Help from another person to put on and taking off regular lower body clothing?: Total 6 Click Score: 12   End of Session Nurse Communication: Mobility status  Activity Tolerance: Patient tolerated treatment well Patient left: in bed;with call bell/phone within reach;with bed alarm set  OT Visit Diagnosis: Unsteadiness on feet (R26.81);Muscle weakness (generalized) (M62.81);History of falling (Z91.81);Repeated falls (R29.6)                Time: 5784-6962 OT Time Calculation (min): 19 min Charges:  OT General Charges $OT Visit: 1 Visit OT Evaluation $OT Eval Moderate Complexity: 1 Mod  Lovett Ruck OT/L  Lacretia Piccolo 10/04/2023, 1:32 PM

## 2023-10-05 DIAGNOSIS — R627 Adult failure to thrive: Secondary | ICD-10-CM | POA: Diagnosis not present

## 2023-10-05 DIAGNOSIS — Z515 Encounter for palliative care: Secondary | ICD-10-CM

## 2023-10-05 DIAGNOSIS — R531 Weakness: Secondary | ICD-10-CM | POA: Diagnosis not present

## 2023-10-05 DIAGNOSIS — N179 Acute kidney failure, unspecified: Secondary | ICD-10-CM | POA: Diagnosis not present

## 2023-10-05 DIAGNOSIS — Z7189 Other specified counseling: Secondary | ICD-10-CM | POA: Diagnosis not present

## 2023-10-05 LAB — CBC WITH DIFFERENTIAL/PLATELET
Abs Immature Granulocytes: 0.07 10*3/uL (ref 0.00–0.07)
Basophils Absolute: 0.1 10*3/uL (ref 0.0–0.1)
Basophils Relative: 1 %
Eosinophils Absolute: 0.9 10*3/uL — ABNORMAL HIGH (ref 0.0–0.5)
Eosinophils Relative: 6 %
HCT: 35.4 % — ABNORMAL LOW (ref 36.0–46.0)
Hemoglobin: 11.9 g/dL — ABNORMAL LOW (ref 12.0–15.0)
Immature Granulocytes: 0 %
Lymphocytes Relative: 12 %
Lymphs Abs: 2 10*3/uL (ref 0.7–4.0)
MCH: 33.1 pg (ref 26.0–34.0)
MCHC: 33.6 g/dL (ref 30.0–36.0)
MCV: 98.6 fL (ref 80.0–100.0)
Monocytes Absolute: 1.3 10*3/uL — ABNORMAL HIGH (ref 0.1–1.0)
Monocytes Relative: 8 %
Neutro Abs: 11.7 10*3/uL — ABNORMAL HIGH (ref 1.7–7.7)
Neutrophils Relative %: 73 %
Platelets: 218 10*3/uL (ref 150–400)
RBC: 3.59 MIL/uL — ABNORMAL LOW (ref 3.87–5.11)
RDW: 12.8 % (ref 11.5–15.5)
WBC: 16 10*3/uL — ABNORMAL HIGH (ref 4.0–10.5)
nRBC: 0 % (ref 0.0–0.2)

## 2023-10-05 LAB — COMPREHENSIVE METABOLIC PANEL WITH GFR
ALT: 14 U/L (ref 0–44)
AST: 24 U/L (ref 15–41)
Albumin: 3.5 g/dL (ref 3.5–5.0)
Alkaline Phosphatase: 45 U/L (ref 38–126)
Anion gap: 9 (ref 5–15)
BUN: 27 mg/dL — ABNORMAL HIGH (ref 8–23)
CO2: 23 mmol/L (ref 22–32)
Calcium: 11.2 mg/dL — ABNORMAL HIGH (ref 8.9–10.3)
Chloride: 111 mmol/L (ref 98–111)
Creatinine, Ser: 1.21 mg/dL — ABNORMAL HIGH (ref 0.44–1.00)
GFR, Estimated: 43 mL/min — ABNORMAL LOW (ref >60)
Glucose, Bld: 115 mg/dL — ABNORMAL HIGH (ref 70–99)
Potassium: 4.3 mmol/L (ref 3.5–5.1)
Sodium: 143 mmol/L (ref 135–145)
Total Bilirubin: 1 mg/dL (ref 0.0–1.2)
Total Protein: 6.5 g/dL (ref 6.5–8.1)

## 2023-10-05 LAB — MAGNESIUM: Magnesium: 2 mg/dL (ref 1.7–2.4)

## 2023-10-05 LAB — PHOSPHORUS: Phosphorus: 2.4 mg/dL — ABNORMAL LOW (ref 2.5–4.6)

## 2023-10-05 MED ORDER — K PHOS MONO-SOD PHOS DI & MONO 155-852-130 MG PO TABS
500.0000 mg | ORAL_TABLET | Freq: Once | ORAL | Status: AC
  Administered 2023-10-05: 500 mg via ORAL
  Filled 2023-10-05: qty 2

## 2023-10-05 MED ORDER — SODIUM CHLORIDE 0.9 % IV SOLN
1.0000 g | Freq: Every day | INTRAVENOUS | Status: DC
  Administered 2023-10-05 – 2023-10-08 (×4): 1 g via INTRAVENOUS
  Filled 2023-10-05 (×4): qty 10

## 2023-10-05 NOTE — NC FL2 (Signed)
 [place]  MEDICAID FL2 LEVEL OF CARE FORM     IDENTIFICATION  Patient Name: Kimberly Webb Birthdate: 28-Jun-1932 Sex: female Admission Date (Current Location): 10/02/2023  [facility] and [place] Number:  Producer, television/film/video and Address:  [facility], [address], [place], [place] [address]      Provider Number: [id number]  Attending Physician Name and Address:  Eveline Hipps, DO  Relative Name and Phone Number:       Current Level of Care: Hospital Recommended Level of Care: Skilled Nursing Facility Prior Approval Number:    Date Approved/Denied:   PASRR Number: [id number]  Discharge Plan: SNF    Current Diagnoses: Patient Active Problem List   Diagnosis Date Noted   Protein-calorie malnutrition, severe 10/04/2023   Failure to thrive in adult 10/03/2023   Hypercalcemia 10/03/2023   Generalized weakness 10/03/2023   Multiple closed fractures of ribs of left side 10/03/2023   AKI (acute kidney injury) (HCC) 10/02/2023   C1 cervical fracture (HCC) 07/21/2019   Neck fracture (HCC) 07/21/2019   Vaginal discharge 09/07/2016   Menopause 09/06/2016   History of herpes genitalis 04/04/2016   Skin mole 04/01/2013   STD (sexually transmitted disease)    Rectocele    Atrophic vaginitis    Osteoporosis    Hypercholesteremia    Arthritis    Hypothyroidism     Orientation RESPIRATION BLADDER Height & Weight     Self  Normal Continent, External catheter Weight: 145 lb (65.8 kg) Height:  5\' 2"  (157.5 cm)  BEHAVIORAL SYMPTOMS/MOOD NEUROLOGICAL BOWEL NUTRITION STATUS      Continent Diet (See dc summary)  AMBULATORY STATUS COMMUNICATION OF NEEDS Skin   Extensive Assist Verbally Normal                       Personal Care Assistance Level of Assistance  Bathing, Feeding, Dressing Bathing Assistance: Maximum assistance Feeding assistance: Limited assistance Dressing Assistance: Limited assistance     Functional Limitations Info   Sight Sight Info: Impaired        SPECIAL CARE FACTORS FREQUENCY  PT (By licensed PT), OT (By licensed OT)     PT Frequency: 5x/week OT Frequency: 5x/week            Contractures Contractures Info: Not present    Additional Factors Info  Code Status, Allergies Code Status Info: Full Allergies Info: NKA           Current Medications (10/05/2023):  This is the current hospital active medication list Current Facility-Administered Medications  Medication Dose Route Frequency Provider Last Rate Last Admin   0.9 %  sodium chloride  infusion   Intravenous Continuous Sheikh, Omair Latif, DO 75 mL/hr at 10/05/23 0323 New Bag at 10/05/23 0323   acetaminophen  (TYLENOL ) tablet 1,000 mg  1,000 mg Oral Q8H Vita Grip, MD   1,000 mg at 10/05/23 0540   acyclovir  (ZOVIRAX ) tablet 400 mg  400 mg Oral Daily Amponsah, Prosper M, MD   400 mg at 10/05/23 5621   enoxaparin  (LOVENOX ) injection 30 mg  30 mg Subcutaneous Q24H Amponsah, Prosper M, MD   30 mg at 10/04/23 2121   feeding supplement (ENSURE ENLIVE / ENSURE PLUS) liquid 237 mL  237 mL Oral BID BM SheikhJonel Nephew Megargel, DO   237 mL at 10/05/23 3086   HYDROmorphone  (DILAUDID ) injection 0.5 mg  0.5 mg Intravenous Q6H PRN Amponsah, Prosper M, MD       levothyroxine  (SYNTHROID )  tablet 100 mcg  100 mcg Oral Q0600 Amponsah, Prosper M, MD   100 mcg at 10/05/23 0604   lidocaine  (LIDODERM ) 5 % 1 patch  1 patch Transdermal QHS Vita Grip, MD   1 patch at 10/04/23 2122   methocarbamol  (ROBAXIN ) tablet 500 mg  500 mg Oral Q8H PRN Annetta Killian, PA-C       multivitamin with minerals tablet 1 tablet  1 tablet Oral Daily Vita Grip, MD   1 tablet at 10/05/23 1324   niacin  (VITAMIN B3) tablet 500 mg  500 mg Oral QHS Amponsah, Prosper M, MD   500 mg at 10/04/23 2121   omega-3 acid ethyl esters (LOVAZA ) capsule 1 g  1 g Oral Daily Amponsah, Prosper M, MD   1 g at 10/05/23 4010   ondansetron  (ZOFRAN ) tablet 4 mg  4 mg Oral Q6H PRN  Amponsah, Prosper M, MD       Or   ondansetron  (ZOFRAN ) injection 4 mg  4 mg Intravenous Q6H PRN Amponsah, Prosper M, MD       senna-docusate (Senokot-S) tablet 1 tablet  1 tablet Oral QHS PRN Vita Grip, MD       simvastatin  (ZOCOR ) tablet 40 mg  40 mg Oral QHS Amponsah, Prosper M, MD   40 mg at 10/04/23 2121   thiamine  (VITAMIN B1) tablet 100 mg  100 mg Oral Daily Sheikh, Omair Latif, DO   100 mg at 10/05/23 2725   traMADol  (ULTRAM ) tablet 25 mg  25 mg Oral Q6H PRN Johnson, Kelly R, PA-C   25 mg at 10/05/23 3664     Discharge Medications: Please see discharge summary for a list of discharge medications.  Relevant Imaging Results:  Relevant Lab Results:   Additional Information SSN: [id number]  Rainbow Salman S Franciscojavier Wronski, LCSW

## 2023-10-05 NOTE — Progress Notes (Signed)
 PROGRESS NOTE    Kimberly Webb  ZOX:[id number] DOB: [birth year] DOA: 10/02/2023 PCP: Brenita Callow, L.Rozelle Corning, MD (Inactive)   Brief Narrative:  Patient is an 89 year old Caucasian female with a past medical history significant for HTN, HLD, hypothyroidism, osteoporosis and arthritis who presented to the ED for evaluation of generalized weakness after recent fall approximately 10 days ago. During that incidence he was evaluated at [facility] and had a negative trauma scan.   Since then Patient was initially able to ambulate with a walker even after the fall however over the last few days she has become significantly fatigued and weak and has had extremely poor p.o. intake. She is evaluated by PCP who advised her to come to the ED and trauma surgery evaluated her given her multiple rib fractures.  She is being admitted for failure to thrive, acute metabolic encephalopathy and acute kidney injury as well as hypercalcemia.  She remains on IV fluids and palliative care medicine has been consulted for goals of care discussion. Mentation is improving and Ca2+ is trending down and she is improving. PT/OT recommending SNF.   Assessment and Plan:  Failure to Thrive: Had poor p.o. intake last week after her recent fall.  See workup as below.  PT/OT recommending SNF. Getting IV fluid hydration as below.  Nutritionist Consulted;  SLP evaluation given that she had trouble difficulty swallowing her pills and now on D1 Thin Liquid diet. UDS negative and TSH WNL. Checking U/A and Urine Cx. Palliative consulted for goals of care discussion as well. Head CT and Cervical Spine CT done and no acute intracranial abnormalities or significant interval change but did show remote lacunar infarcts in the basal ganglia and thalamic bilaterally and along with stable atrophy and white matter disease likely reflecting sequela of chronic microvascular ischemia.  If her electrolytes are corrected and calcium improves will consider  EEG and MRI of the brain if she remains altered however she is clearer.    Acute Metabolic Encephalopathy: In the setting of HyperCa2+ and improving.  C/w IVF hydration as below; Calcitonin is complete.  If electrolytes are corrected and still encephalopathic will consider other etiologies of AMS and will obtain MRI of the brain and EEG. Will need outpatient Neurological evaluation and Neuropsychiatric testing  AKI / Metabolic Acidosis: Improving Has had recent failure of breath with decreased p.o. intake after recent fall; BUN/Cr Trend: Recent Labs  Lab 10/02/23 1443 10/02/23 2257 10/03/23 0702 10/03/23 1552 10/04/23 0456 10/05/23 0459  BUN 71* 63* 51* 40* 29* 27*  CREATININE 2.41* 1.90* 1.67* 1.39* 1.31* 1.21*  -Status post 2 L IV NS and 2 L  LR bolus followed by mIVF with LR at 100 mL/hr; Now being continued on NS @ 75 mL/hr; MA has improved with a CO2 of 23, AG of 9, Chloride Level of 111 -Check U/A as below Urine Cx and still pending.  If necessary will obtain urine osmolality, urine sodium, urine creatinine and renal ultrasound -Avoid Nephrotoxic Medications, Contrast Dyes, Hypotension and Dehydration to Ensure Adequate Renal Perfusion and will need to Renally Adjust Med. CTM and Trend Renal Function carefully and repeat CMP in the AM    Hypercalcemia: Ca2+ of15.0 on admission with AMS on exam but does have Has history of mild HyperCa2+ with Ca2+ of 11.2 ~ 4 years ago; Hypercalcemia 2/2 to acute kidney injury. Improving with IVF hydration and changed to NS @ 75 mL/hr and will be continued for another 1 day at least. Last Ca2+ is now 11.2.  Will hold off IV bisphosphonate until CrCl > 35. S/p subcu calcitonin twice daily x 4 doses. Ionized Ca2+ was 1.87. CTM and Trend and repeat CMP in the AM   Leukocytosis with suspected UTI: Initially Worsening and now Trending down; WBC went from 14.5 -> 18.6 -> 16.0 -> 16.0. Check for Infection and U/A and Urine Cx not done on Admission so ordering now.   Urine showed a hazy appearance with moderate leukocytes, positive nitrites, rare bacteria, 0-5 RBCs per high-powered field, 0-5 squamous epithelial cells and 11-20 WBCs.  Will empirically start IV ceftriaxone pending her urine culture  Hypomagnesemia: Mag Level improved to 2.0. Replete with IV Mag Sulfate 2 grams yesterday. CTM and Replete as necessary. Repeat Mag Level in the AM  Hypophosphatemia: Phos Level improved to 2.4; Replete with po K Phos Neutral 500 mg x1. CTM and Replete as Necessary. Repeat Phos Level in the AM  Dysphagia: Nursing reported some trouble swallowing Pills. SLP consulted.    Multiple Rib Fx in the setting of Recent fall /  Generalized Weakness / Physical Deconditioning and Debility: Patient with a reported for 10 days ago with negative workup at [facility]. Trauma scan shows acute minimally displaced fractures of the L 3rd-7th ribs as well as Displaced fracture of the left lateral 9th rib.  Patient with no significant pain or tenderness to palpation of the ribs. Trauma surgery signed off. C/w Pain control with lidocaine  patch, scheduled Tylenol , as needed IV Dilaudid . Incentive Spirometry. PT/OT eval and treat. Fall precautions   Osteoporosis: On Prolia  every 6 months. Vitamin D  level (74.72) WNL. C/w Multivitamin   Essential HTN: Hold BP meds for now. CTM BP per Protocol. Last BP reading was 160/80   HLD/Hypercholesteremia: C/w Simvastatin  40 mg po Daily and Omega-3 fatty acid   Hypothyroidism: TSH (1.569) and free T4 (1.10) WNL.C/w Levothyroxine  100 mcg if able to tolerate po  Normocytic Anemia: Hgb/Hct went from 13.3/40.0 -> 11.9/36.0 -> 11.9/35.4;Likely hemoconcentrated on admission. Check Anemia Panel in the AM. CTM for S/Sx of Bleeding; No overt bleeding noted. Repeat CBC in the AM  Hyperbilirubinemia: Mild and Likely reactive. T Bili went from 1.0 -> 0.9 -> 1.5 -> 1.0.CTM and trend and repeat CMP in the AM   Overweight: Complicates overall prognosis and care.  Estimated body mass index is 26.52 kg/m as calculated from the following:   Height as of this encounter: 5\' 2"  (1.575 m).   Weight as of this encounter: 65.8 kg. Weight Loss and Dietary Counseling given  Severe Malnutrition in the Context of Acute illness/injury: Nutrition Status: Nutrition Problem: Severe Malnutrition Etiology: acute illness Signs/Symptoms: severe muscle depletion, moderate fat depletion, percent weight loss (15 lbs, 9% in 3 months.) Percent weight loss: 9 % Interventions: Refer to RD note for recommendations   DVT prophylaxis: enoxaparin  (LOVENOX ) injection 30 mg Start: 10/02/23 2115    Code Status: Full Code Family Communication: No family present @ bedside  Disposition Plan:  Level of care: Telemetry Medical Status is: Inpatient Remains inpatient appropriate because: Needs further improvement in Ca2+ and PT/OT recommending SNF so will need bed availability and Insurance Authorization   Consultants:  Palliative Care Medicine   Procedures:  As delineated as above  Antimicrobials:  Anti-infectives (From admission, onward)    Start     Dose/Rate Route Frequency Ordered Stop   10/05/23 1630  cefTRIAXone (ROCEPHIN) 1 g in sodium chloride  0.9 % 100 mL IVPB        1 g 200 mL/hr over 30  Minutes Intravenous Every 24 hours 10/05/23 1540     10/03/23 1000  acyclovir  (ZOVIRAX ) tablet 400 mg        400 mg Oral Daily 10/03/23 0039         Subjective: Seen and examined at bedside and resting and was doing okay.  No nausea or vomiting.  Feels okay.  More interactive and alert.  No other concerns or complaints at this time.  Objective: Vitals:   10/04/23 1639 10/04/23 2058 10/05/23 0426 10/05/23 0935  BP: (!) 140/62 (!) 146/65 (!) 155/71 (!) 160/80  Pulse: (!) 59 61 63 69  Resp: 17 17 17 17   Temp: 97.6 F (36.4 C) 97.7 F (36.5 C) 97.9 F (36.6 C) 98.4 F (36.9 C)  TempSrc: Oral Oral Oral Oral  SpO2: 97% 97% 96% 95%  Weight:      Height:         Intake/Output Summary (Last 24 hours) at 10/05/2023 1546 Last data filed at 10/05/2023 1511 Gross per 24 hour  Intake 2390 ml  Output 325 ml  Net 2065 ml   Filed Weights   10/02/23 1429  Weight: 65.8 kg   Examination: Physical Exam:  Constitutional: Elderly overweight Caucasian female who is more awake and laying in bed states she is sleepy Respiratory: Diminished to auscultation bilaterally, no wheezing, rales, rhonchi or crackles. Normal respiratory effort and patient is not tachypenic. No accessory muscle use.  Unlabored breathing Cardiovascular: RRR, no murmurs / rubs / gallops. S1 and S2 auscultated. No extremity edema..  Abdomen: Soft, non-tender, mildly distended secondary to body habitus. Bowel sounds positive.  GU: Deferred. Musculoskeletal: No clubbing / cyanosis of digits/nails. No joint deformity upper and lower extremities.  Neurologic: CN 2-12 grossly intact with no focal deficits. Romberg sign and cerebellar reflexes not assessed.  Psychiatric: Normal judgment and insight.  She is awake and alert  Data Reviewed: I have personally reviewed following labs and imaging studies  CBC: Recent Labs  Lab 10/02/23 1443 10/02/23 1505 10/03/23 0702 10/04/23 0456 10/05/23 0459  WBC 14.5*  --  18.6* 16.0* 16.0*  NEUTROABS 10.9*  --   --  11.9* 11.7*  HGB 13.7 13.9 13.3 11.9* 11.9*  HCT 41.8 41.0 40.0 36.0 35.4*  MCV 101.5*  --  100.8* 99.7 98.6  PLT 251  --  230 242 218   Basic Metabolic Panel: Recent Labs  Lab 10/02/23 2257 10/03/23 0702 10/03/23 1552 10/04/23 0456 10/05/23 0459  NA 146* 143 141 142 143  K 4.8 4.4 4.4 4.5 4.3  CL 113* 110 112* 109 111  CO2 19* 19* 19* 20* 23  GLUCOSE 92 116* 117* 91 115*  BUN 63* 51* 40* 29* 27*  CREATININE 1.90* 1.67* 1.39* 1.31* 1.21*  CALCIUM 14.1* 13.2* 12.6* 12.0* 11.2*  MG  --   --  1.6* 1.6* 2.0  PHOS  --   --  1.8* 1.6* 2.4*   GFR: Estimated Creatinine Clearance: 28.6 mL/min (A) (by C-G formula based on SCr of  1.21 mg/dL (H)). Liver Function Tests: Recent Labs  Lab 10/02/23 1443 10/03/23 0702 10/03/23 1552 10/04/23 0456 10/05/23 0459  AST 28 31 27  33 24  ALT 16 15 16 14 14   ALKPHOS 43 45 43 42 45  BILITOT 1.0 1.0 0.9 1.5* 1.0  PROT 7.8 7.2 7.1 6.7 6.5  ALBUMIN 4.3 3.9 3.9 3.7 3.5   No results for input(s): "LIPASE", "AMYLASE" in the last 168 hours. Recent Labs  Lab 10/02/23 1433  AMMONIA 17  Coagulation Profile: No results for input(s): "INR", "PROTIME" in the last 168 hours. Cardiac Enzymes: No results for input(s): "CKTOTAL", "CKMB", "CKMBINDEX", "TROPONINI" in the last 168 hours. BNP (last 3 results) No results for input(s): "PROBNP" in the last 8760 hours. HbA1C: No results for input(s): "HGBA1C" in the last 72 hours. CBG: Recent Labs  Lab 10/02/23 1441  GLUCAP 126*   Lipid Profile: No results for input(s): "CHOL", "HDL", "LDLCALC", "TRIG", "CHOLHDL", "LDLDIRECT" in the last 72 hours. Thyroid  Function Tests: No results for input(s): "TSH", "T4TOTAL", "FREET4", "T3FREE", "THYROIDAB" in the last 72 hours. Anemia Panel: No results for input(s): "VITAMINB12", "FOLATE", "FERRITIN", "TIBC", "IRON", "RETICCTPCT" in the last 72 hours. Sepsis Labs: Recent Labs  Lab 10/02/23 1505  LATICACIDVEN 1.6   No results found for this or any previous visit (from the past 240 hours).   Radiology Studies: No results found.  Scheduled Meds:  acetaminophen   1,000 mg Oral Q8H   acyclovir   400 mg Oral Daily   enoxaparin  (LOVENOX ) injection  30 mg Subcutaneous Q24H   feeding supplement  237 mL Oral BID BM   levothyroxine   100 mcg Oral Q0600   lidocaine   1 patch Transdermal QHS   multivitamin with minerals  1 tablet Oral Daily   niacin   500 mg Oral QHS   omega-3 acid ethyl esters  1 g Oral Daily   simvastatin   40 mg Oral QHS   thiamine   100 mg Oral Daily   Continuous Infusions:  sodium chloride  75 mL/hr at 10/05/23 0323   cefTRIAXone (ROCEPHIN)  IV      LOS: 3 days   Aura Leeds, DO [facility] Available via Epic secure chat 7am-7pm After these hours, please refer to coverage provider listed on [email or web address] 10/05/2023, 3:46 PM

## 2023-10-05 NOTE — TOC Initial Note (Addendum)
 Transition of Care [facility]) - Initial/Assessment Note    Patient Details  Name: Kimberly Webb MRN: [id number] Date of Birth: July 16, 1932  Transition of Care [facility]) CM/SW Contact:    Jannice Mends, LCSW Phone Number: 10/05/2023, 9:38 AM  Clinical Narrative:                 CSW received consult for possible SNF placement at time of discharge. CSW spoke with patient's daughter, [name]. She expressed understanding of PT recommendation and is agreeable to SNF placement at time of discharge. She reports preference for [facility] as that is where patient's spouse went to rehab before. She requested somewhere close to [facility] if [facility] is not available. CSW discussed insurance authorization process and will provide Medicare SNF ratings list. CSW will send out referrals for review and provide bed offers as available. Reached out to [employer] to review.   Skilled Nursing Rehab Facilities-   [email or web address]   Ratings out of 5 stars (5 the highest)   Name Address  Phone # Quality Care Staffing Health Inspection Overall  [facility] [address], [place] [phone] 3 3 4 4   [facility] [address] [place] [phone] 5 1 4 4   [facility] [address], [facility] [phone] 2 2 2 2   [facility] [address] [place] [phone] 5 2 4 5   [facility]  [address], [place] [phone] 4 3 5 5   [facility] [address] [facility] [phone] 4 2 2 2   [facility] [address], [place] [phone] 5 1 2 2   [facility] [phone] [address] [place] [phone] 2 1 3 2   [address] ([facility]) [address], [place] [phone] 2 3 3 3   [facility] [address] [place], [place] [phone] 3 3 2 2   [facility] [address], [place] [phone] 3 1 1 1   [facility] [address] [place] [phone]  2 3 4 4   [facility] [address] [place] [phone] 4 4 3 3   [facility] ([facility]) [address], [place] [phone] 1 2 4 3           [facility] [address] [place] [phone] 2 1 4 3   [facility] [address], [place] [phone] 4 2 1 1   [facility]  [address] [place], [place] [address] [phone] 2 2 2 2   [facility] [place] [address] [phone] 3 2 4 4   [facility] [address] [place] [phone] 2 1 2 1   [facility] (No [facility]) [address], [place] [phone] 5 4 5 5   [facility] [address], [facility] [phone] 3 4 2 2   [facility] [address] [place] [phone] 2 1 1 1   [facility] [address] [place] [phone] 4 2 5 5   [facility] [address], [place] [phone] 4 1 1 1   [facility] [address] [place], [place] [phone] 2 2 3 3           [facility]  [phone] 3 1 1 1   [facility] [address] [place]  [phone] 3 3 3 3   [facility] (No [employer]) [address], [place] [phone] 2 2 4 4   [facility]) [address], [place] [phone] 2 1 1 1   [facility] [place] [address], [place] [phone] 4 3 5  5  [facility] [place] [address], [place] [phone] 1 1 1 1           [facility] [address] [place], [place] [phone] 5 4 5 5   [facility]  [address] [place] [phone] 1 1 2 1   [facility] [address] [place], [place] [phone]  2 4 4   [facility] [place] [address] [place] [phone] 3 5 5 5   [address] [place], [place] [phone] 4 2 2 2   [facility]) [address] [place] [phone] 2 1 3 2      Expected Discharge Plan: Skilled Nursing Facility Barriers to Discharge: Continued Medical Work up, SNF  Pending bed offer   Patient Goals and CMS Choice Patient states their goals for this hospitalization and ongoing recovery are:: Rehab CMS Medicare.gov Compare Post Acute Care list provided to:: Patient Represenative (must comment) Choice offered to / list presented to : Adult Children [place] ownership interest [facility].provided to:: Adult Children    Expected Discharge Plan and Services In-house Referral: Clinical Social Work   Post Acute Care Choice: Skilled Nursing Facility Living arrangements for the past 2 months: Single Family Home                                      Prior Living Arrangements/Services Living arrangements for the past 2 months: Single Family Home Lives with:: Adult Children Patient language and need for interpreter reviewed:: Yes Do you feel safe going back to the place where you live?: Yes      Need for Family Participation in Patient Care: Yes (Comment) Care giver support system in place?: Yes (comment) Current home services: DME Criminal Activity/Legal Involvement Pertinent to Current Situation/Hospitalization: No - Comment as needed  Activities of Daily Living   ADL Screening (condition at time of admission) Independently performs ADLs?: No Does the patient have a NEW difficulty with bathing/dressing/toileting/self-feeding that is expected to last >3 days?: Yes (Initiates electronic notice to provider for possible OT consult) Does the patient have a NEW difficulty with getting in/out of bed, walking, or climbing stairs that is expected to last >3 days?: Yes (Initiates electronic notice to provider for possible PT consult) Does the patient have a NEW difficulty with communication that is expected to last >3 days?: Yes (Initiates electronic notice to provider for possible SLP consult) Is the patient deaf or have difficulty hearing?: No Does the patient have difficulty seeing, even when wearing glasses/contacts?: No Does the  patient have difficulty concentrating, remembering, or making decisions?: Yes  Permission Sought/Granted Permission sought to share information with : Facility Medical sales representative, Family Supports Permission granted to share information with : No  Share Information with NAME: [name]  Permission granted to share info w AGENCY: SNFs  Permission granted to share info w Relationship: Daughter  Permission granted to share info w Contact Information: [phone]  Emotional Assessment Appearance:: Appears stated age Attitude/Demeanor/Rapport: Unable to Assess Affect (typically observed): Unable to Assess Orientation: : Oriented to Self Alcohol / Substance Use: Not Applicable Psych Involvement: No (comment)  Admission diagnosis:  Hypercalcemia [E83.52] Failure to thrive in adult [R62.7] AKI (acute kidney injury) (HCC) [N17.9] Patient Active Problem List   Diagnosis Date Noted   Protein-calorie malnutrition, severe 10/04/2023   Failure to thrive in adult 10/03/2023   Hypercalcemia 10/03/2023   Generalized weakness  10/03/2023   Multiple closed fractures of ribs of left side 10/03/2023   AKI (acute kidney injury) (HCC) 10/02/2023   C1 cervical fracture (HCC) 07/21/2019   Neck fracture (HCC) 07/21/2019   Vaginal discharge 09/07/2016   Menopause 09/06/2016   History of herpes genitalis 04/04/2016   Skin mole 04/01/2013   STD (sexually transmitted disease)    Rectocele    Atrophic vaginitis    Osteoporosis    Hypercholesteremia    Arthritis    Hypothyroidism    PCP:  Brenita Callow, L.Rozelle Corning, MD (Inactive) Pharmacy:   [id number] [place] - [address] AT [address]OSS [address] [place] [address] Phone: [phone] Fax: [phone]  [facility] [place], [place] - [address] [place] [address] Phone: [phone] Fax: [phone]     Social Drivers of Health (SDOH) Social  History: SDOH Screenings   Utilities: Patient Unable To Answer (10/02/2023)  Social Connections: Unknown (10/03/2023)  Tobacco Use: Medium Risk (10/02/2023)   SDOH Interventions:     Readmission Risk Interventions     No data to display

## 2023-10-05 NOTE — Plan of Care (Signed)

## 2023-10-05 NOTE — Consult Note (Addendum)
 Palliative Care Consult Note                                  Date: 10/05/2023   Patient Name: Kimberly Webb  DOB: 1933-04-18  MRN: [id number]  Age / Sex: 89 y.o., female  PCP: Brenita Callow, L.Rozelle Corning, MD (Inactive) Referring Physician: Eveline Hipps, DO  Reason for Consultation: Establishing goals of care  HPI/Patient Profile: 89 y.o. female  with past medical history of hypothyroidism, HTN, HDL, osteoporosis, and arthritis who presented to the ED on 10/02/2023 with generalized weakness after a fall on 09/22/2023.  She was evaluated at [facility] ED and had a negative trauma scan.  Since the fall, patient has become progressively weaker with extremely poor p.o. intake.  She is admitted with failure to thrive, acute metabolic encephalopathy, AKI, and hypercalcemia.  Palliative Medicine has been consulted for goals of care.   Subjective:   Extensive chart review has been completed including labs, vital signs, imaging, progress/consult notes, orders, medications and available advance directive documents.   I assessed patient at bedside.  She is awake and oriented to self, hospital, and month (but states year is 35).  She is able to tell me she is in the hospital due to a "concussion", but is not able to verbalize any additional details about her current medical situation.  She does endorse weakness and poor appetite.  I spoke with her daughter/Kimberly Webb by phone to discuss diagnosis, prognosis, GOC, disposition, and options.  I introduced Palliative Medicine as specialized medical care for people living with serious illness. It focuses on providing relief from the symptoms and stress of a serious illness.   Created space and opportunity for daughter to explore thoughts and feelings regarding current medical situation. Values and goals of care were attempted to be elicited.  Social History: Patient's husband/Kimberly Webb has disabilities secondary to a  stroke.  She has 2 daughters - Kimberly Webb and Kimberly Webb.   Functional Status: Patient lives at home with her husband and daughter/Kimberly Webb.  GOC Discussion: We discussed patient's current illness and what it means in the larger context of her ongoing co-morbidities. Current clinical status was reviewed.  Discussed  patient's multiple issues including (probable) dementia, acute kidney injury, dehydration/poor oral intake, hypercalcemia, fall with rib fractures, and suspected UTI.    I expressed concern that patient would not return to her previous baseline in the setting of her multiple comorbidities and current debilitated state.   Discussed the "what ifs" and encouraged daughter to consider what medical interventions patient would or would not want in the event her condition were to deteriorate, keeping in mind the concept of quality of life.    Discussed code status. I encouraged consideration of DNR/DNI status and provided education on evidence-based poor outcomes in similar hospitalized patients, as the cause of cardiac arrest would likely be associated with advanced illness rather than a reversible condition.  Daughter seems to understand patient's current medical situation, but is struggling to make any decisions.  She is willing to meet in person tomorrow to further discuss GOC.   Review of Systems  Constitutional:  Positive for appetite change.  Neurological:  Positive for weakness.    Objective:   Primary Diagnoses: Present on Admission:  AKI (acute kidney injury) [facility])   Physical Exam Vitals reviewed.  Constitutional:      General: She is not in acute distress.    Comments: Frail and chronically ill-appearing  Pulmonary:     Effort: Pulmonary effort is normal.     Comments: Room air Neurological:     Mental Status: She is alert.     Comments: Oriented to person, place, and month  Psychiatric:        Cognition and Memory: Memory is impaired.    Palliative Assessment/Data:  PPS 30%     Assessment & Plan:   SUMMARY OF RECOMMENDATIONS   Continue current supportive interventions Meeting with daughter/Kimberly Webb tomorrow at 1 PM  Primary Decision Maker: NEXT OF KIN - husband and 2 daughters  Code Status/Advance Care Planning: Full code for now with ongoing discussion  Prognosis:  Unable to determine  Discharge Planning:  To Be Determined    Thank you for allowing us  to participate in the care of SUNDAE BERTH   Time Total: Time: 75 minutes  Detailed review of medical records (labs, imaging, vital signs), medically appropriate exam, discussed with treatment team, counseling and education to patient, family, & staff, documenting clinical information, coordination of care.   Signed by: Maisie Scotland, NP Palliative Medicine Team  Team Phone # [phone]  For individual providers, please see AMION

## 2023-10-06 DIAGNOSIS — R531 Weakness: Secondary | ICD-10-CM | POA: Diagnosis not present

## 2023-10-06 DIAGNOSIS — E43 Unspecified severe protein-calorie malnutrition: Secondary | ICD-10-CM | POA: Diagnosis not present

## 2023-10-06 DIAGNOSIS — N390 Urinary tract infection, site not specified: Secondary | ICD-10-CM

## 2023-10-06 DIAGNOSIS — Z515 Encounter for palliative care: Secondary | ICD-10-CM | POA: Diagnosis not present

## 2023-10-06 DIAGNOSIS — Z7189 Other specified counseling: Secondary | ICD-10-CM | POA: Diagnosis not present

## 2023-10-06 DIAGNOSIS — N179 Acute kidney failure, unspecified: Secondary | ICD-10-CM | POA: Diagnosis not present

## 2023-10-06 DIAGNOSIS — B9620 Unspecified Escherichia coli [E. coli] as the cause of diseases classified elsewhere: Secondary | ICD-10-CM

## 2023-10-06 DIAGNOSIS — R627 Adult failure to thrive: Secondary | ICD-10-CM | POA: Diagnosis not present

## 2023-10-06 LAB — COMPREHENSIVE METABOLIC PANEL WITH GFR
ALT: 12 U/L (ref 0–44)
AST: 17 U/L (ref 15–41)
Albumin: 3 g/dL — ABNORMAL LOW (ref 3.5–5.0)
Alkaline Phosphatase: 39 U/L (ref 38–126)
Anion gap: 7 (ref 5–15)
BUN: 27 mg/dL — ABNORMAL HIGH (ref 8–23)
CO2: 24 mmol/L (ref 22–32)
Calcium: 10.9 mg/dL — ABNORMAL HIGH (ref 8.9–10.3)
Chloride: 114 mmol/L — ABNORMAL HIGH (ref 98–111)
Creatinine, Ser: 1.19 mg/dL — ABNORMAL HIGH (ref 0.44–1.00)
GFR, Estimated: 44 mL/min — ABNORMAL LOW (ref >60)
Glucose, Bld: 122 mg/dL — ABNORMAL HIGH (ref 70–99)
Potassium: 3.7 mmol/L (ref 3.5–5.1)
Sodium: 145 mmol/L (ref 135–145)
Total Bilirubin: 0.3 mg/dL (ref 0.0–1.2)
Total Protein: 6 g/dL — ABNORMAL LOW (ref 6.5–8.1)

## 2023-10-06 LAB — CBC WITH DIFFERENTIAL/PLATELET
Abs Immature Granulocytes: 0.06 10*3/uL (ref 0.00–0.07)
Basophils Absolute: 0.1 10*3/uL (ref 0.0–0.1)
Basophils Relative: 1 %
Eosinophils Absolute: 0.8 10*3/uL — ABNORMAL HIGH (ref 0.0–0.5)
Eosinophils Relative: 6 %
HCT: 31.3 % — ABNORMAL LOW (ref 36.0–46.0)
Hemoglobin: 10.3 g/dL — ABNORMAL LOW (ref 12.0–15.0)
Immature Granulocytes: 0 %
Lymphocytes Relative: 14 %
Lymphs Abs: 1.9 10*3/uL (ref 0.7–4.0)
MCH: 33 pg (ref 26.0–34.0)
MCHC: 32.9 g/dL (ref 30.0–36.0)
MCV: 100.3 fL — ABNORMAL HIGH (ref 80.0–100.0)
Monocytes Absolute: 1.2 10*3/uL — ABNORMAL HIGH (ref 0.1–1.0)
Monocytes Relative: 9 %
Neutro Abs: 9.4 10*3/uL — ABNORMAL HIGH (ref 1.7–7.7)
Neutrophils Relative %: 70 %
Platelets: 180 10*3/uL (ref 150–400)
RBC: 3.12 MIL/uL — ABNORMAL LOW (ref 3.87–5.11)
RDW: 13.1 % (ref 11.5–15.5)
WBC: 13.5 10*3/uL — ABNORMAL HIGH (ref 4.0–10.5)
nRBC: 0 % (ref 0.0–0.2)

## 2023-10-06 LAB — PHOSPHORUS: Phosphorus: 1.8 mg/dL — ABNORMAL LOW (ref 2.5–4.6)

## 2023-10-06 LAB — MAGNESIUM: Magnesium: 1.6 mg/dL — ABNORMAL LOW (ref 1.7–2.4)

## 2023-10-06 MED ORDER — POTASSIUM PHOSPHATES 15 MMOLE/5ML IV SOLN
30.0000 mmol | Freq: Once | INTRAVENOUS | Status: AC
  Administered 2023-10-06: 30 mmol via INTRAVENOUS
  Filled 2023-10-06: qty 10

## 2023-10-06 MED ORDER — POTASSIUM PHOSPHATES 15 MMOLE/5ML IV SOLN: 20.0000 mmol | Freq: Once | INTRAVENOUS | Status: DC

## 2023-10-06 MED ORDER — MAGNESIUM SULFATE 2 GM/50ML IV SOLN
2.0000 g | Freq: Once | INTRAVENOUS | Status: AC
Start: 2023-10-06 — End: 2023-10-06
  Administered 2023-10-06: 2 g via INTRAVENOUS
  Filled 2023-10-06: qty 50

## 2023-10-06 NOTE — Progress Notes (Signed)
 Palliative Medicine Progress Note   Patient Name: Kimberly Webb       Date: 10/06/2023 DOB: 06-25-1932  Age: 89 y.o. MRN#: [id number] Attending Physician: Eveline Hipps, DO Primary Care Physician: Napolean Backbone.Rozelle Corning, MD (Inactive) Admit Date: 10/02/2023  Reason for Consultation/Follow-up: {Reason for Consult:23484}  HPI/Patient Profile: 89 y.o. female  with past medical history of hypothyroidism, HTN, HDL, osteoporosis, and arthritis who presented to the ED on 10/02/2023 with generalized weakness after a fall on 09/22/2023.  She was evaluated at [facility] ED and had a negative trauma scan.  Since the fall, patient has become progressively weaker with extremely poor p.o. intake.  She is admitted with failure to thrive, acute metabolic encephalopathy, AKI, and hypercalcemia.   Palliative Medicine has been consulted for goals of care.  Subjective:   Discussion: We discussed patient's current illness and what it means in the larger context of her ongoing co-morbidities. Current clinical status was reviewed.  Discussed  patient's multiple issues including (probable) dementia, acute kidney injury, dehydration/poor oral intake, hypercalcemia, fall with rib fractures, and suspected UTI.     I expressed concern that patient would not return to her previous baseline in the setting of her multiple comorbidities and current debilitated state.   Discussed the "what ifs" and encouraged daughter to consider what medical interventions patient would or would not want in the event her condition were to deteriorate, keeping in mind the concept of quality of life.    Discussed code status. I encouraged consideration of DNR/DNI status and provided education on evidence-based poor outcomes in similar hospitalized  patients, as the cause of cardiac arrest would likely be associated with advanced illness rather than a reversible condition.   Objective:  Physical Exam           Palliative Assessment/Data: ***     Palliative Medicine Assessment & Plan   Assessment: Principal Problem:   AKI (acute kidney injury) (HCC) Active Problems:   Failure to thrive in adult   Hypercalcemia   Generalized weakness   Multiple closed fractures of ribs of left side   Protein-calorie malnutrition, severe    Recommendations/Plan: Continue current supportive interventions Goals of care is continued improvement and rehab to improve functional status Outpatient palliative referral  Goals of Care and Additional Recommendations: Limitations on Scope of Treatment: {Recommended Scope and  Preferences:21019}  Code Status: Full code   Prognosis:  {Palliative Care Prognosis:23504}  Discharge Planning: {Palliative dispostion:23505}  Care plan was discussed with ***  Thank you for allowing the Palliative Medicine Team to assist in the care of this patient.   ***   Wynetta Heckle, NP   Please contact Palliative Medicine Team phone at [phone] for questions and concerns.  For individual providers, please see AMION.

## 2023-10-06 NOTE — Progress Notes (Signed)
 PROGRESS NOTE    Kimberly Webb  VHQ:[id number] DOB: [birth year] DOA: 10/02/2023 PCP: Brenita Callow, L.Rozelle Corning, MD (Inactive)   Brief Narrative:  Patient is an 89 year old Caucasian female with a past medical history significant for HTN, HLD, hypothyroidism, osteoporosis and arthritis who presented to the ED for evaluation of generalized weakness after recent fall approximately 10 days ago. During that incidence he was evaluated at [facility] and had a negative trauma scan.   Since then Patient was initially able to ambulate with a walker even after the fall however over the last few days she has become significantly fatigued and weak and has had extremely poor p.o. intake. She was evaluated by PCP who advised her to come to the ED and trauma surgery evaluated her given her multiple rib fractures.  She is being admitted for failure to thrive, acute metabolic encephalopathy and acute kidney injury, UTI as well as hypercalcemia.  She remains on IV fluids and palliative care medicine has been consulted for goals of care discussion. Mentation is improving and Ca2+ is trending down and she is improving. PT/OT recommending SNF.   Assessment and Plan:  Failure to Thrive: Had poor p.o. intake last week after her recent fall.  See workup as below.  PT/OT recommending SNF. Getting IV fluid hydration as below.  Nutritionist Consulted;  SLP evaluation given that she had trouble difficulty swallowing her pills and now on D1 Thin Liquid diet. UDS negative and TSH WNL. Checked U/A and Urine Cx as below. Palliative consulted for goals of care discussion as well and plan for meeting 5/4. Head CT and Cervical Spine CT done and no acute intracranial abnormalities or significant interval change but did show remote lacunar infarcts in the basal ganglia and thalamic bilaterally and along with stable atrophy and white matter disease likely reflecting sequela of chronic microvascular ischemia.  If her electrolytes are  corrected and calcium improves will consider EEG and MRI of the brain if she remains altered however she is clearer.    Acute Metabolic Encephalopathy: In the setting of HyperCa2+ and improving.  C/w IVF hydration as below; Calcitonin is complete.  TSH was 1.569 if electrolytes are corrected and still encephalopathic will consider other etiologies of AMS and will obtain MRI of the brain and EEG. Will need outpatient Neurological evaluation and Neuropsychiatric testing. Checked U/A and Urine Cx; UDS Negative  E Coli UTI, poA: UA showed a hazy appearance with moderate leukocytes, positive nitrites, rare bacteria, 0-5 RBCs per high-power field, 0-5 squamous epithelial cells and 11-20 WBCs urine culture done and showed >100,000 CFU of E Coli and 10,000 CFU of Proteus Mirabilis with Cx pending. C/w IV Ceftriaxone and WBC is trending down  AKI / Metabolic Acidosis: Improving Has had recent failure of breath with decreased p.o. intake after recent fall; BUN/Cr Trend: Recent Labs  Lab 10/02/23 1443 10/02/23 2257 10/03/23 0702 10/03/23 1552 10/04/23 0456 10/05/23 0459 10/06/23 0339  BUN 71* 63* 51* 40* 29* 27* 27*  CREATININE 2.41* 1.90* 1.67* 1.39* 1.31* 1.21* 1.19*  -Status post 2 L IV NS and 2 L  LR bolus followed by mIVF with LR at 100 mL/hr; Now being continued on NS @ 75 mL/hr; MA has improved with a CO2 of 24, AG of 9, Chloride Level of 111 -Check U/A as below Urine Cx and still pending.  If necessary will obtain urine osmolality, urine sodium, urine creatinine and renal ultrasound -Avoid Nephrotoxic Medications, Contrast Dyes, Hypotension and Dehydration to Ensure Adequate Renal Perfusion and  will need to Renally Adjust Med. CTM and Trend Renal Function carefully and repeat CMP in the AM    Hypercalcemia: Ca2+ of15.0 on admission with AMS on exam but does have Has history of mild HyperCa2+ with Ca2+ of 11.2 ~ 4 years ago; Hypercalcemia 2/2 to acute kidney injury. Improving with IVF hydration and  changed to NS @ 75 mL/hr and will be continued for another 1 day at least. Last Ca2+ is now 10.9.  Will hold off IV bisphosphonate until CrCl > 35. S/p subcu calcitonin twice daily x 4 doses. Ionized Ca2+ was 1.87. CTM and Trend and repeat CMP in the AM   Leukocytosis with suspected UTI: Initially Worsening and now Trending down; WBC went from 14.5 -> 18.6 -> 16.0 -> 16.0 -> 13.5. Check for Infection and U/A and Urine Cx not done on Admission so ordering now.  Urine showed a hazy appearance with moderate leukocytes, positive nitrites, rare bacteria, 0-5 RBCs per high-powered field, 0-5 squamous epithelial cells and 11-20 WBCs. Will empirically start IV Ceftriaxone pending her urine culture  Hypomagnesemia: Mag Level now 1.6. Replete with IV Mag Sulfate 2 grams. CTM and Replete as necessary. Repeat Mag Level in the AM  Hypophosphatemia: Phos Level now 1.8. Replete with IV K Phos 30 mmol. Repeat Phos Level in the AM  Dysphagia: Nursing reported some trouble swallowing Pills. SLP consulted.    Multiple Rib Fx in the setting of Recent fall /  Generalized Weakness / Physical Deconditioning and Debility: Patient with a reported for 10 days ago with negative workup at [facility]. Trauma scan shows acute minimally displaced fractures of the L 3rd-7th ribs as well as Displaced fracture of the left lateral 9th rib.  Patient with no significant pain or tenderness to palpation of the ribs. Trauma surgery signed off. C/w Pain control with lidocaine  patch, scheduled Tylenol , as needed IV Dilaudid . Incentive Spirometry. PT/OT eval and treat. Fall precautions   Osteoporosis: On Prolia  every 6 months. Vitamin D  level (74.72) WNL. C/w Multivitamin   Essential HTN: Hold BP meds for now. CTM BP per Protocol. Last BP reading was 169/71   HLD/Hypercholesteremia: C/w Simvastatin  40 mg po Daily and Omega-3 fatty acid   Hypothyroidism: TSH (1.569) and free T4 (1.10) WNL.C/w Levothyroxine  100 mcg if able to tolerate  po  Normocytic Anemia: Hgb/Hct went from 13.3/40.0 -> 11.9/36.0 -> 11.9/35.4 -> 10.3/31.3;Likely hemoconcentrated on admission. Check Anemia Panel in the AM. CTM for S/Sx of Bleeding; No overt bleeding noted. Repeat CBC in the AM  Hyperbilirubinemia: Mild and Likely reactive. T Bili went from 1.0 -> 0.9 -> 1.5 -> 1.0 -> 0.3.CTM and trend and repeat CMP in the AM   Overweight: Complicates overall prognosis and care. Estimated body mass index is 26.52 kg/m as calculated from the following:   Height as of this encounter: 5\' 2"  (1.575 m).   Weight as of this encounter: 65.8 kg. Weight Loss and Dietary Counseling given  Hypoalbuminemia: Patient's Albumin Level is now 3.0. CTM and Trend and repeat CMP in the AM  Severe Malnutrition in the Context of Acute illness/injury: Nutrition Status: Nutrition Problem: Severe Malnutrition Etiology: acute illness Signs/Symptoms: severe muscle depletion, moderate fat depletion, percent weight loss (15 lbs, 9% in 3 months.) Percent weight loss: 9 % Interventions: Refer to RD note for recommendations   DVT prophylaxis: enoxaparin  (LOVENOX ) injection 30 mg Start: 10/02/23 2115    Code Status: Full Code Family Communication: No family present @ bedside  Disposition Plan:  Level of care:  Telemetry Medical Status is: Inpatient Remains inpatient appropriate because: Needs further improvement in Ca2+ and PT/OT recommending SNF so will need bed availability and Insurance Authorization    Consultants:  Palliative Care Medicine  Procedures:  As delineated as above  Antimicrobials:  Anti-infectives (From admission, onward)    Start     Dose/Rate Route Frequency Ordered Stop   10/05/23 1600  cefTRIAXone (ROCEPHIN) 1 g in sodium chloride  0.9 % 100 mL IVPB        1 g 200 mL/hr over 30 Minutes Intravenous Daily 10/05/23 1540     10/03/23 1000  acyclovir  (ZOVIRAX ) tablet 400 mg        400 mg Oral Daily 10/03/23 0039         Subjective: Seen and examined  at bedside resting and doing okay.  Denies lightheadedness or dizziness.  No family at bedside.  No other concerns or complaints at this time.  Objective: Vitals:   10/05/23 1549 10/05/23 1953 10/06/23 0440 10/06/23 0753  BP: (!) 142/75 131/73 (!) 165/69 (!) 169/71  Pulse: 86 77 60 60  Resp: 17 18 16 16   Temp: 98 F (36.7 C) 98 F (36.7 C) 98.1 F (36.7 C) 97.7 F (36.5 C)  TempSrc:  Oral Oral Oral  SpO2: 95% 95% 96% 96%  Weight:      Height:        Intake/Output Summary (Last 24 hours) at 10/06/2023 1507 Last data filed at 10/06/2023 1300 Gross per 24 hour  Intake 1950 ml  Output 900 ml  Net 1050 ml   Filed Weights   10/02/23 1429  Weight: 65.8 kg   Examination: Physical Exam:  Constitutional: Elderly overweight Caucasian female who awake and alert in no acute distress Respiratory: Diminished to auscultation bilaterally, no wheezing, rales, rhonchi or crackles. Normal respiratory effort and patient is not tachypenic. No accessory muscle use.  Unlabored breathing Cardiovascular: RRR, no murmurs / rubs / gallops. S1 and S2 auscultated. No extremity edema. Abdomen: Soft, non-tender, non-distended. Bowel sounds positive.  GU: Deferred. Musculoskeletal: No clubbing / cyanosis of digits/nails. No joint deformity upper and lower extremities. Neurologic: CN 2-12 grossly intact with no focal deficits.  Romberg sign and cerebellar reflexes not assessed.  Psychiatric: Normal judgment and insight.  She is awake and alert  Data Reviewed: I have personally reviewed following labs and imaging studies  CBC: Recent Labs  Lab 10/02/23 1443 10/02/23 1505 10/03/23 0702 10/04/23 0456 10/05/23 0459 10/06/23 0339  WBC 14.5*  --  18.6* 16.0* 16.0* 13.5*  NEUTROABS 10.9*  --   --  11.9* 11.7* 9.4*  HGB 13.7 13.9 13.3 11.9* 11.9* 10.3*  HCT 41.8 41.0 40.0 36.0 35.4* 31.3*  MCV 101.5*  --  100.8* 99.7 98.6 100.3*  PLT 251  --  230 242 218 180   Basic Metabolic Panel: Recent Labs  Lab  10/03/23 0702 10/03/23 1552 10/04/23 0456 10/05/23 0459 10/06/23 0339  NA 143 141 142 143 145  K 4.4 4.4 4.5 4.3 3.7  CL 110 112* 109 111 114*  CO2 19* 19* 20* 23 24  GLUCOSE 116* 117* 91 115* 122*  BUN 51* 40* 29* 27* 27*  CREATININE 1.67* 1.39* 1.31* 1.21* 1.19*  CALCIUM 13.2* 12.6* 12.0* 11.2* 10.9*  MG  --  1.6* 1.6* 2.0 1.6*  PHOS  --  1.8* 1.6* 2.4* 1.8*   GFR: Estimated Creatinine Clearance: 29.1 mL/min (A) (by C-G formula based on SCr of 1.19 mg/dL (H)). Liver Function Tests: Recent Labs  Lab 10/03/23 0702 10/03/23 1552 10/04/23 0456 10/05/23 0459 10/06/23 0339  AST 31 27 33 24 17  ALT 15 16 14 14 12   ALKPHOS 45 43 42 45 39  BILITOT 1.0 0.9 1.5* 1.0 0.3  PROT 7.2 7.1 6.7 6.5 6.0*  ALBUMIN 3.9 3.9 3.7 3.5 3.0*   No results for input(s): "LIPASE", "AMYLASE" in the last 168 hours. Recent Labs  Lab 10/02/23 1433  AMMONIA 17   Coagulation Profile: No results for input(s): "INR", "PROTIME" in the last 168 hours. Cardiac Enzymes: No results for input(s): "CKTOTAL", "CKMB", "CKMBINDEX", "TROPONINI" in the last 168 hours. BNP (last 3 results) No results for input(s): "PROBNP" in the last 8760 hours. HbA1C: No results for input(s): "HGBA1C" in the last 72 hours. CBG: Recent Labs  Lab 10/02/23 1441  GLUCAP 126*   Lipid Profile: No results for input(s): "CHOL", "HDL", "LDLCALC", "TRIG", "CHOLHDL", "LDLDIRECT" in the last 72 hours. Thyroid  Function Tests: No results for input(s): "TSH", "T4TOTAL", "FREET4", "T3FREE", "THYROIDAB" in the last 72 hours. Anemia Panel: No results for input(s): "VITAMINB12", "FOLATE", "FERRITIN", "TIBC", "IRON", "RETICCTPCT" in the last 72 hours. Sepsis Labs: Recent Labs  Lab 10/02/23 1505  LATICACIDVEN 1.6   Recent Results (from the past 240 hours)  Urine Culture (for pregnant, neutropenic or urologic patients or patients with an indwelling urinary catheter)     Status: Abnormal (Preliminary result)   Collection Time:  10/05/23 10:14 AM   Specimen: Urine, Catheterized  Result Value Ref Range Status   Specimen Description URINE, CATHETERIZED  Final   Special Requests NONE  Final   Culture (A)  Final    >=100,000 COLONIES/mL ESCHERICHIA COLI 10,000 COLONIES/mL PROTEUS MIRABILIS CULTURE REINCUBATED FOR BETTER GROWTH Performed at [facility], [address], [place], [place] [address]    Report Status PENDING  Incomplete    Radiology Studies: No results found.  Scheduled Meds:  acetaminophen   1,000 mg Oral Q8H   acyclovir   400 mg Oral Daily   enoxaparin  (LOVENOX ) injection  30 mg Subcutaneous Q24H   feeding supplement  237 mL Oral BID BM   levothyroxine   100 mcg Oral Q0600   lidocaine   1 patch Transdermal QHS   multivitamin with minerals  1 tablet Oral Daily   niacin   500 mg Oral QHS   omega-3 acid ethyl esters  1 g Oral Daily   simvastatin   40 mg Oral QHS   thiamine   100 mg Oral Daily   Continuous Infusions:  sodium chloride  75 mL/hr at 10/06/23 0523   cefTRIAXone (ROCEPHIN)  IV 1 g (10/06/23 0830)   potassium PHOSPHATE IVPB (in mmol) 30 mmol (10/06/23 1109)    LOS: 4 days   Aura Leeds, DO [facility] Available via Epic secure chat 7am-7pm After these hours, please refer to coverage provider listed on [email or web address] 10/06/2023, 3:07 PM

## 2023-10-06 NOTE — Plan of Care (Signed)
  Problem: Education: Goal: Knowledge of General Education information will improve Description: Including pain rating scale, medication(s)/side effects and non-pharmacologic comfort measures Outcome: Not Met (add Reason)   Problem: Health Behavior/Discharge Planning: Goal: Ability to manage health-related needs will improve Outcome: Not Met (add Reason)   Problem: Clinical Measurements: Goal: Ability to maintain clinical measurements within normal limits will improve Outcome: Not Met (add Reason) Goal: Will remain free from infection Outcome: Not Met (add Reason) Goal: Diagnostic test results will improve Outcome: Not Met (add Reason) Goal: Respiratory complications will improve Outcome: Not Met (add Reason) Goal: Cardiovascular complication will be avoided Outcome: Not Met (add Reason)   Problem: Activity: Goal: Risk for activity intolerance will decrease Outcome: Not Met (add Reason)   Problem: Nutrition: Goal: Adequate nutrition will be maintained Outcome: Not Met (add Reason)   Problem: Coping: Goal: Level of anxiety will decrease Outcome: Not Met (add Reason)   Problem: Elimination: Goal: Will not experience complications related to bowel motility Outcome: Not Met (add Reason) Goal: Will not experience complications related to urinary retention Outcome: Not Met (add Reason)   Problem: Pain Managment: Goal: General experience of comfort will improve and/or be controlled Outcome: Not Met (add Reason)   Problem: Safety: Goal: Ability to remain free from injury will improve Outcome: Not Met (add Reason)   Problem: Skin Integrity: Goal: Risk for impaired skin integrity will decrease Outcome: Not Met (add Reason)

## 2023-10-07 DIAGNOSIS — R531 Weakness: Secondary | ICD-10-CM | POA: Diagnosis not present

## 2023-10-07 DIAGNOSIS — N179 Acute kidney failure, unspecified: Secondary | ICD-10-CM | POA: Diagnosis not present

## 2023-10-07 DIAGNOSIS — R627 Adult failure to thrive: Secondary | ICD-10-CM | POA: Diagnosis not present

## 2023-10-07 LAB — IRON AND TIBC
Iron: 38 ug/dL (ref 28–170)
Saturation Ratios: 19 % (ref 10.4–31.8)
TIBC: 202 ug/dL — ABNORMAL LOW (ref 250–450)
UIBC: 164 ug/dL

## 2023-10-07 LAB — CBC WITH DIFFERENTIAL/PLATELET
Abs Immature Granulocytes: 0.1 10*3/uL — ABNORMAL HIGH (ref 0.00–0.07)
Basophils Absolute: 0.1 10*3/uL (ref 0.0–0.1)
Basophils Relative: 1 %
Eosinophils Absolute: 1.4 10*3/uL — ABNORMAL HIGH (ref 0.0–0.5)
Eosinophils Relative: 10 %
HCT: 31.6 % — ABNORMAL LOW (ref 36.0–46.0)
Hemoglobin: 10.4 g/dL — ABNORMAL LOW (ref 12.0–15.0)
Immature Granulocytes: 1 %
Lymphocytes Relative: 19 %
Lymphs Abs: 2.6 10*3/uL (ref 0.7–4.0)
MCH: 32.7 pg (ref 26.0–34.0)
MCHC: 32.9 g/dL (ref 30.0–36.0)
MCV: 99.4 fL (ref 80.0–100.0)
Monocytes Absolute: 1.1 10*3/uL — ABNORMAL HIGH (ref 0.1–1.0)
Monocytes Relative: 8 %
Neutro Abs: 8.4 10*3/uL — ABNORMAL HIGH (ref 1.7–7.7)
Neutrophils Relative %: 61 %
Platelets: 211 10*3/uL (ref 150–400)
RBC: 3.18 MIL/uL — ABNORMAL LOW (ref 3.87–5.11)
RDW: 13.2 % (ref 11.5–15.5)
WBC: 13.6 10*3/uL — ABNORMAL HIGH (ref 4.0–10.5)
nRBC: 0 % (ref 0.0–0.2)

## 2023-10-07 LAB — RETICULOCYTES
Immature Retic Fract: 14 % (ref 2.3–15.9)
RBC.: 3.19 MIL/uL — ABNORMAL LOW (ref 3.87–5.11)
Retic Count, Absolute: 34.5 10*3/uL (ref 19.0–186.0)
Retic Ct Pct: 1.1 % (ref 0.4–3.1)

## 2023-10-07 LAB — FERRITIN: Ferritin: 329 ng/mL — ABNORMAL HIGH (ref 11–307)

## 2023-10-07 LAB — PHOSPHORUS: Phosphorus: 2.9 mg/dL (ref 2.5–4.6)

## 2023-10-07 LAB — COMPREHENSIVE METABOLIC PANEL WITH GFR
ALT: 15 U/L (ref 0–44)
AST: 19 U/L (ref 15–41)
Albumin: 2.9 g/dL — ABNORMAL LOW (ref 3.5–5.0)
Alkaline Phosphatase: 43 U/L (ref 38–126)
Anion gap: 8 (ref 5–15)
BUN: 23 mg/dL (ref 8–23)
CO2: 22 mmol/L (ref 22–32)
Calcium: 10.7 mg/dL — ABNORMAL HIGH (ref 8.9–10.3)
Chloride: 113 mmol/L — ABNORMAL HIGH (ref 98–111)
Creatinine, Ser: 1.06 mg/dL — ABNORMAL HIGH (ref 0.44–1.00)
GFR, Estimated: 51 mL/min — ABNORMAL LOW (ref >60)
Glucose, Bld: 108 mg/dL — ABNORMAL HIGH (ref 70–99)
Potassium: 3.8 mmol/L (ref 3.5–5.1)
Sodium: 143 mmol/L (ref 135–145)
Total Bilirubin: 0.4 mg/dL (ref 0.0–1.2)
Total Protein: 6 g/dL — ABNORMAL LOW (ref 6.5–8.1)

## 2023-10-07 LAB — MAGNESIUM: Magnesium: 1.9 mg/dL (ref 1.7–2.4)

## 2023-10-07 LAB — VITAMIN B12: Vitamin B-12: 340 pg/mL (ref 180–914)

## 2023-10-07 LAB — FOLATE: Folate: 19.2 ng/mL (ref >5.9)

## 2023-10-07 MED ORDER — SODIUM CHLORIDE 0.9 % IV BOLUS
500.0000 mL | Freq: Once | INTRAVENOUS | Status: AC
  Administered 2023-10-07: 500 mL via INTRAVENOUS

## 2023-10-07 MED ORDER — ZINC OXIDE 40 % EX OINT
TOPICAL_OINTMENT | Freq: Two times a day (BID) | CUTANEOUS | Status: DC
  Filled 2023-10-07: qty 57

## 2023-10-07 NOTE — Progress Notes (Signed)
 PROGRESS NOTE    Kimberly Webb  ZOX:[id number] DOB: 28-Apr-1933 DOA: 10/02/2023 PCP: Brenita Callow, L.Rozelle Corning, MD (Inactive)   Brief Narrative:  Patient is an 89 year old Caucasian female with a past medical history significant for HTN, HLD, hypothyroidism, osteoporosis and arthritis who presented to the ED for evaluation of generalized weakness after recent fall approximately 10 days ago. During that incidence he was evaluated at [facility] and had a negative trauma scan.   Since then Patient was initially able to ambulate with a walker even after the fall however over the last few days she has become significantly fatigued and weak and has had extremely poor p.o. intake. She was evaluated by PCP who advised her to come to the ED and trauma surgery evaluated her given her multiple rib fractures.  She is being admitted for failure to thrive, acute metabolic encephalopathy and acute kidney injury, UTI as well as hypercalcemia.  She remains on IV fluids and palliative care medicine has been consulted for goals of care discussion. Mentation is improving and Ca2+ is trending down and she is improving. U/A done and PT/OT recommending SNF.   Assessment and Plan:  Failure to Thrive: Had poor p.o. intake last week after her recent fall.  See workup as below.  PT/OT recommending SNF. Getting IV fluid hydration as below.  Nutritionist Consulted;  SLP evaluation given that she had trouble difficulty swallowing her pills and now on D1 Thin Liquid diet. UDS negative and TSH WNL. Checked U/A and Urine Cx as below. Palliative consulted for goals of care discussion as well and plan for meeting 5/4. Head CT and Cervical Spine CT done and no acute intracranial abnormalities or significant interval change but did show remote lacunar infarcts in the basal ganglia and thalamic bilaterally and along with stable atrophy and white matter disease likely reflecting sequela of chronic microvascular ischemia.  If her  electrolytes are corrected and calcium improves will consider EEG and MRI of the brain if she remains altered however she is clearer.    Acute Metabolic Encephalopathy: In the setting of HyperCa2+ and improving.  C/w IVF hydration as below; Calcitonin is complete.  TSH was 1.569. If electrolytes are corrected and still encephalopathic will consider other etiologies of AMS and will obtain MRI of the brain and EEG. Will need outpatient Neurological evaluation and Neuropsychiatric testing. Checked U/A and Urine Cx and as below; UDS Negative  E Coli, Proteus, and Aerococcus UTI, poA: UA showed a hazy appearance with moderate leukocytes, positive nitrites, rare bacteria, 0-5 RBCs per high-power field, 0-5 squamous epithelial cells and 11-20 WBCs urine culture done and showed >100,000 CFU of E Coli and 10,000 CFU of Proteus Mirabilis and Now >100,000 CFU of Aerococcus Species with Cx pending. C/w IV Ceftriaxone and WBC is trending down and still mildly elevated at 13.6  AKI / Metabolic Acidosis: Improving Has had recent failure of breath with decreased p.o. intake after recent fall; BUN/Cr Trend went from 71/2.41 -> 23/1.06 -Status post 2 L IV NS and 2 L  LR bolus followed by mIVF with LR at 100 mL/hr; Now being continued on NS @ 75 mL/hr with 500 mL Bolus to be given today; MA has improved with a CO2 of 22, AG of 8, Chloride Level of 113 -Check U/A as below Urine Cx and still pending.  If necessary will obtain urine osmolality, urine sodium, urine creatinine and renal ultrasound -Avoid Nephrotoxic Medications, Contrast Dyes, Hypotension and Dehydration to Ensure Adequate Renal Perfusion and will need  to [employer]. CTM and Trend Renal Function carefully and repeat CMP in the AM    Hypercalcemia: Ca2+ of 15.0 on admission with AMS on exam but does have Has history of mild HyperCa2+ with Ca2+ of 11.2 ~ 4 years ago; Hypercalcemia 2/2 to acute kidney injury. Improving with IVF hydration and changed to NS @  75 mL/hr and will be continued for another 1 day at least. Will give her 500 mL bolus. Last Ca2+ is now 10.7.  Will hold off IV bisphosphonate until CrCl > 35. S/p subcu calcitonin twice daily x 4 doses. Ionized Ca2+ was 1.87. CTM and Trend and repeat CMP in the AM   Leukocytosis with suspected UTI: Initially Worsening and now Trending down; WBC went from 14.5 -> 18.6 -> 16.0 -> 16.0 -> 13.5 -> 13.6. Check for Infection and U/A and Urine Cx not done on Admission so ordering now.  Urine showed a hazy appearance with moderate leukocytes, positive nitrites, rare bacteria, 0-5 RBCs per high-powered field, 0-5 squamous epithelial cells and 11-20 WBCs. Will empirically start IV Ceftriaxone pending her urine culture  Hypomagnesemia: Mag Level now 1.6. Replete with IV Mag Sulfate 2 grams. CTM and Replete as necessary. Repeat Mag Level in the AM  Hypophosphatemia: Phos Level now 1.8. Replete with IV K Phos 30 mmol. Repeat Phos Level in the AM  Dysphagia: Nursing reported some trouble swallowing Pills. SLP consulted.    Multiple Rib Fx in the setting of Recent fall /  Generalized Weakness / Physical Deconditioning and Debility: Patient with a reported for 10 days ago with negative workup at [facility]. Trauma scan shows acute minimally displaced fractures of the L 3rd-7th ribs as well as Displaced fracture of the left lateral 9th rib.  Patient with no significant pain or tenderness to palpation of the ribs. Trauma surgery signed off. C/w Pain control with lidocaine  patch, scheduled Tylenol , as needed IV Dilaudid . Incentive Spirometry. PT/OT eval and treat. Fall precautions   Osteoporosis: On Prolia  every 6 months. Vitamin D  level (74.72) WNL. C/w Multivitamin   Essential HTN: Hold BP meds for now. CTM BP per Protocol. Last BP reading was 160/67   HLD/Hypercholesteremia: C/w Simvastatin  40 mg po Daily and Omega-3 fatty acid   Hypothyroidism: TSH (1.569) and free T4 (1.10) WNL.C/w Levothyroxine  100 mcg if able to  tolerate po  Normocytic Anemia: Hgb/Hct went from 13.3/40.0 -> 11.9/36.0 -> 11.9/35.4 -> 10.3/31.3 -> 10.4/32.6;Likely hemoconcentrated on admission. Check Anemia Panel and showed an iron level of 38, UIBC 164, TIBC of 202, saturation of 19%, ferritin level 329, folate level of 19.2 and a vitamin B12 level of 340. CTM for S/Sx of Bleeding; No overt bleeding noted. Repeat CBC in the AM  Hyperbilirubinemia: Mild and Likely reactive. T Bili went from 1.5 (at its peak) -> 1.0 -> 0.3 -> 0.4.CTM and trend and repeat CMP in the AM   Overweight: Complicates overall prognosis and care. Estimated body mass index is 26.52 kg/m as calculated from the following:   Height as of this encounter: 5\' 2"  (1.575 m).   Weight as of this encounter: 65.8 kg. Weight Loss and Dietary Counseling given  Hypoalbuminemia: Patient's Albumin Level is now 3.0. CTM and Trend and repeat CMP in the AM  Severe Malnutrition in the Context of Acute illness/injury: Nutrition Status: Nutrition Problem: Severe Malnutrition Etiology: acute illness Signs/Symptoms: severe muscle depletion, moderate fat depletion, percent weight loss (15 lbs, 9% in 3 months.) Percent weight loss: 9 % Interventions: Refer to RD  note for recommendations   DVT prophylaxis: enoxaparin  (LOVENOX ) injection 30 mg Start: 10/02/23 2115    Code Status: Full Code Family Communication: No family present @ bedside  Disposition Plan:  Level of care: Telemetry Medical Status is: Inpatient Remains inpatient appropriate because: Needs further improvement in Ca2+ and Urine Cx results; Anticipate D/C to SNF in the next 24-48 hours   Consultants:  Palliative Care Medicine   Procedures:  As delineated as above  Antimicrobials:  Anti-infectives (From admission, onward)    Start     Dose/Rate Route Frequency Ordered Stop   10/05/23 1600  cefTRIAXone (ROCEPHIN) 1 g in sodium chloride  0.9 % 100 mL IVPB        1 g 200 mL/hr over 30 Minutes Intravenous Daily  10/05/23 1540     10/03/23 1000  acyclovir  (ZOVIRAX ) tablet 400 mg        400 mg Oral Daily 10/03/23 0039         Subjective: Seen and examined at bedside is much more awake and alert and oriented getting fed by the nurse tech.  No nausea or vomiting.  Feels okay.  No other concerns or complaints at this time.  Objective: Vitals:   10/06/23 1545 10/06/23 2008 10/07/23 0518 10/07/23 0742  BP: (!) 155/68 (!) 149/62 (!) 166/68 (!) 160/67  Pulse: 63 60 61 (!) 59  Resp: 17 18 20 16   Temp: 97.8 F (36.6 C) 98.1 F (36.7 C) 97.9 F (36.6 C) 98 F (36.7 C)  TempSrc: Oral Oral Oral Oral  SpO2: 97% 97% 96% 96%  Weight:      Height:        Intake/Output Summary (Last 24 hours) at 10/07/2023 1440 Last data filed at 10/07/2023 0600 Gross per 24 hour  Intake 2810.86 ml  Output 900 ml  Net 1910.86 ml   Filed Weights   10/02/23 1429  Weight: 65.8 kg   Examination: Physical Exam:  Constitutional: Elderly overweight Caucasian female who is awake and alert in no acute distress Respiratory: Diminished to auscultation bilaterally, no wheezing, rales, rhonchi or crackles. Normal respiratory effort and patient is not tachypenic. No accessory muscle use.  Unlabored breathing Cardiovascular: RRR, no murmurs / rubs / gallops. S1 and S2 auscultated. No extremity edema.   Abdomen: Soft, non-tender, non-distended. Bowel sounds positive.  GU: Deferred. Musculoskeletal: No clubbing / cyanosis of digits/nails. No joint deformity upper and lower extremities. Good ROM, no contractures. Normal strength and muscle tone.  Skin: No rashes, lesions, ulcers on limited skin evaluation. No induration; Warm and dry.  Neurologic: CN 2-12 grossly intact with no focal deficits. Romberg sign and cerebellar reflexes not assessed.  Psychiatric: Normal judgment and insight. Alert and oriented x 3.   Data Reviewed: I have personally reviewed following labs and imaging studies  CBC: Recent Labs  Lab 10/02/23 1443  10/02/23 1505 10/03/23 0702 10/04/23 0456 10/05/23 0459 10/06/23 0339 10/07/23 0723  WBC 14.5*  --  18.6* 16.0* 16.0* 13.5* 13.6*  NEUTROABS 10.9*  --   --  11.9* 11.7* 9.4* 8.4*  HGB 13.7   < > 13.3 11.9* 11.9* 10.3* 10.4*  HCT 41.8   < > 40.0 36.0 35.4* 31.3* 31.6*  MCV 101.5*  --  100.8* 99.7 98.6 100.3* 99.4  PLT 251  --  230 242 218 180 211   < > = values in this interval not displayed.   Basic Metabolic Panel: Recent Labs  Lab 10/03/23 1552 10/04/23 4098 10/05/23 0459 10/06/23 1191 10/07/23 4782  NA 141 142 143 145 143  K 4.4 4.5 4.3 3.7 3.8  CL 112* 109 111 114* 113*  CO2 19* 20* 23 24 22   GLUCOSE 117* 91 115* 122* 108*  BUN 40* 29* 27* 27* 23  CREATININE 1.39* 1.31* 1.21* 1.19* 1.06*  CALCIUM 12.6* 12.0* 11.2* 10.9* 10.7*  MG 1.6* 1.6* 2.0 1.6* 1.9  PHOS 1.8* 1.6* 2.4* 1.8* 2.9   GFR: Estimated Creatinine Clearance: 32.7 mL/min (A) (by C-G formula based on SCr of 1.06 mg/dL (H)). Liver Function Tests: Recent Labs  Lab 10/03/23 1552 10/04/23 0456 10/05/23 0459 10/06/23 0339 10/07/23 0723  AST 27 33 24 17 19   ALT 16 14 14 12 15   ALKPHOS 43 42 45 39 43  BILITOT 0.9 1.5* 1.0 0.3 0.4  PROT 7.1 6.7 6.5 6.0* 6.0*  ALBUMIN 3.9 3.7 3.5 3.0* 2.9*   No results for input(s): "LIPASE", "AMYLASE" in the last 168 hours. Recent Labs  Lab 10/02/23 1433  AMMONIA 17   Coagulation Profile: No results for input(s): "INR", "PROTIME" in the last 168 hours. Cardiac Enzymes: No results for input(s): "CKTOTAL", "CKMB", "CKMBINDEX", "TROPONINI" in the last 168 hours. BNP (last 3 results) No results for input(s): "PROBNP" in the last 8760 hours. HbA1C: No results for input(s): "HGBA1C" in the last 72 hours. CBG: Recent Labs  Lab 10/02/23 1441  GLUCAP 126*   Lipid Profile: No results for input(s): "CHOL", "HDL", "LDLCALC", "TRIG", "CHOLHDL", "LDLDIRECT" in the last 72 hours. Thyroid  Function Tests: No results for input(s): "TSH", "T4TOTAL", "FREET4", "T3FREE",  "THYROIDAB" in the last 72 hours. Anemia Panel: Recent Labs    10/07/23 0723  VITAMINB12 340  FOLATE 19.2  FERRITIN 329*  TIBC 202*  IRON 38  RETICCTPCT 1.1   Sepsis Labs: Recent Labs  Lab 10/02/23 1505  LATICACIDVEN 1.6   Recent Results (from the past 240 hours)  Urine Culture (for pregnant, neutropenic or urologic patients or patients with an indwelling urinary catheter)     Status: Abnormal (Preliminary result)   Collection Time: 10/05/23 10:14 AM   Specimen: Urine, Catheterized  Result Value Ref Range Status   Specimen Description URINE, CATHETERIZED  Final   Special Requests NONE  Final   Culture (A)  Final    >=100,000 COLONIES/mL ESCHERICHIA COLI 10,000 COLONIES/mL PROTEUS MIRABILIS >=100,000 COLONIES/mL AEROCOCCUS SPECIES Standardized susceptibility testing for this organism is not available. SUSCEPTIBILITIES TO FOLLOW Performed at [facility], [address], [place], [place] [address]    Report Status PENDING  Incomplete    Radiology Studies: No results found.  Scheduled Meds:  acetaminophen   1,000 mg Oral Q8H   acyclovir   400 mg Oral Daily   enoxaparin  (LOVENOX ) injection  30 mg Subcutaneous Q24H   feeding supplement  237 mL Oral BID BM   levothyroxine   100 mcg Oral Q0600   lidocaine   1 patch Transdermal QHS   multivitamin with minerals  1 tablet Oral Daily   niacin   500 mg Oral QHS   omega-3 acid ethyl esters  1 g Oral Daily   simvastatin   40 mg Oral QHS   thiamine   100 mg Oral Daily   Continuous Infusions:  sodium chloride  75 mL/hr at 10/07/23 0143   cefTRIAXone (ROCEPHIN)  IV 1 g (10/07/23 1106)   sodium chloride       LOS: 5 days   Aura Leeds, DO [facility] Available via Epic secure chat 7am-7pm After these hours, please refer to coverage provider listed on [email or web address] 10/07/2023, 2:40 PM

## 2023-10-07 NOTE — TOC Progression Note (Signed)
 Transition of Care [facility]) - Progression Note    Patient Details  Name: Kimberly Webb MRN: [id number] Date of Birth: Jan 24, 1933  [facility] CM/SW Contact  Caldwell Kronenberger A [place], LCSW Phone Number: 10/07/2023, 1:16 PM  Clinical Narrative:     CSW reached out to pt's daughter, [name] and informed her of acceptance of bed offer at [facility].  She accepted, CSW informed Soy at [facility], has bed available.    TOC will continue to follow.   Expected Discharge Plan: Skilled Nursing Facility Barriers to Discharge: Continued Medical Work up, SNF Pending bed offer  Expected Discharge Plan and Services In-house Referral: Clinical Social Work   Post Acute Care Choice: Skilled Nursing Facility Living arrangements for the past 2 months: Single Family Home                                       Social Determinants of Health (SDOH) Interventions SDOH Screenings   Utilities: Patient Unable To Answer (10/02/2023)  Social Connections: Unknown (10/03/2023)  Tobacco Use: Medium Risk (10/02/2023)    Readmission Risk Interventions     No data to display

## 2023-10-07 NOTE — Consult Note (Signed)
 WOC Nurse Consult Note: Reason for Consult: redness to sacrum  Wound type: Stage 1 Pressure Injury Sacrum  Pressure Injury POA: no  Measurement: see nursing flowsheet  Wound bed: erythema, no open wound  Drainage (amount, consistency, odor)  none Periwound: Dressing procedure/placement/frequency: Cleanse sacrum/buttocks with soap and water, dry and apply a thin layer of Desitin 2 times daily and prn soiling.  Apply silicone foam to sacrum, lift daily to assess area. Change foam q3 days and prn soiling.   Appreciate G. Divanna assistance with this consult.  WOC team will not follow. Re-consult if further needs arise.   Thank you,    Jalin Alicea MSN, RN-BC, CWOCN [phone]  Conservative sharp wound debridement (CSWD performed at the bedside):

## 2023-10-07 NOTE — Progress Notes (Signed)
 Speech Language Pathology Treatment: Dysphagia  Patient Details Name: Kimberly Webb MRN: [id number] DOB: 1933-05-22 Today's Date: 10/07/2023 Time: 1414-1430 SLP Time Calculation (min) (ACUTE ONLY): 16 min  Assessment / Plan / Recommendation Clinical Impression  Pt more alert with decreased confusion today with daughter at bedside who stated she feels her mom is able to get the puree food "down better." There were no s/s aspiration with consecutive straw sips thin or magic cup. Orally managed the puree/magic cup without difficulty. Pt states her appetite is low and her stomach "grumbles" like  she needs to have a bowel movement. Pt stated she wanted to remain on puree for now. ST will continue to follow while here for ability to upgrade. If she is discharged prior to therapist return, SLP at SNF can assess pt for upgrade as able.   HPI HPI: 89 yo female [facility] with progressive weakness s/p fall. Fall on 4/21, went to outside hospital with CXR negative for acute findings. Imaging this admission shows L 3-7 rib fxs, additional workup for AKI. PMH includes OA, HTN, osteoporosis,      SLP Plan  Continue with current plan of care      Recommendations for follow up therapy are one component of a multi-disciplinary discharge planning process, led by the attending physician.  Recommendations may be updated based on patient status, additional functional criteria and insurance authorization.    Recommendations  Diet recommendations: Dysphagia 1 (puree);Thin liquid Liquids provided via: Cup;Straw Medication Administration: Whole meds with puree Supervision: Staff to assist with self feeding Compensations: Slow rate;Small sips/bites Postural Changes and/or Swallow Maneuvers: Seated upright 90 degrees                  Oral care BID   Intermittent Supervision/Assistance Dysphagia, unspecified (R13.10)     Continue with current plan of care     Kimberly Webb  10/07/2023, 2:42 PM

## 2023-10-08 DIAGNOSIS — S2242XD Multiple fractures of ribs, left side, subsequent encounter for fracture with routine healing: Secondary | ICD-10-CM | POA: Diagnosis not present

## 2023-10-08 DIAGNOSIS — R29818 Other symptoms and signs involving the nervous system: Secondary | ICD-10-CM | POA: Diagnosis not present

## 2023-10-08 DIAGNOSIS — B964 Proteus (mirabilis) (morganii) as the cause of diseases classified elsewhere: Secondary | ICD-10-CM | POA: Diagnosis not present

## 2023-10-08 DIAGNOSIS — Z8673 Personal history of transient ischemic attack (TIA), and cerebral infarction without residual deficits: Secondary | ICD-10-CM | POA: Diagnosis not present

## 2023-10-08 DIAGNOSIS — R4182 Altered mental status, unspecified: Secondary | ICD-10-CM | POA: Diagnosis not present

## 2023-10-08 DIAGNOSIS — E039 Hypothyroidism, unspecified: Secondary | ICD-10-CM | POA: Diagnosis not present

## 2023-10-08 DIAGNOSIS — D72829 Elevated white blood cell count, unspecified: Secondary | ICD-10-CM | POA: Diagnosis not present

## 2023-10-08 DIAGNOSIS — Z7401 Bed confinement status: Secondary | ICD-10-CM | POA: Diagnosis not present

## 2023-10-08 DIAGNOSIS — I10 Essential (primary) hypertension: Secondary | ICD-10-CM | POA: Diagnosis not present

## 2023-10-08 DIAGNOSIS — F5101 Primary insomnia: Secondary | ICD-10-CM | POA: Diagnosis not present

## 2023-10-08 DIAGNOSIS — E7220 Disorder of urea cycle metabolism, unspecified: Secondary | ICD-10-CM | POA: Diagnosis not present

## 2023-10-08 DIAGNOSIS — N179 Acute kidney failure, unspecified: Secondary | ICD-10-CM | POA: Diagnosis not present

## 2023-10-08 DIAGNOSIS — M1612 Unilateral primary osteoarthritis, left hip: Secondary | ICD-10-CM | POA: Diagnosis not present

## 2023-10-08 DIAGNOSIS — B9629 Other Escherichia coli [E. coli] as the cause of diseases classified elsewhere: Secondary | ICD-10-CM | POA: Diagnosis not present

## 2023-10-08 DIAGNOSIS — M25552 Pain in left hip: Secondary | ICD-10-CM | POA: Diagnosis not present

## 2023-10-08 DIAGNOSIS — Z87891 Personal history of nicotine dependence: Secondary | ICD-10-CM | POA: Diagnosis not present

## 2023-10-08 DIAGNOSIS — I6523 Occlusion and stenosis of bilateral carotid arteries: Secondary | ICD-10-CM | POA: Diagnosis not present

## 2023-10-08 DIAGNOSIS — E43 Unspecified severe protein-calorie malnutrition: Secondary | ICD-10-CM | POA: Diagnosis not present

## 2023-10-08 DIAGNOSIS — Z28311 Partially vaccinated for covid-19: Secondary | ICD-10-CM | POA: Diagnosis not present

## 2023-10-08 DIAGNOSIS — R0789 Other chest pain: Secondary | ICD-10-CM | POA: Diagnosis not present

## 2023-10-08 DIAGNOSIS — R5383 Other fatigue: Secondary | ICD-10-CM | POA: Diagnosis not present

## 2023-10-08 DIAGNOSIS — B9620 Unspecified Escherichia coli [E. coli] as the cause of diseases classified elsewhere: Secondary | ICD-10-CM | POA: Diagnosis not present

## 2023-10-08 DIAGNOSIS — Z23 Encounter for immunization: Secondary | ICD-10-CM | POA: Diagnosis not present

## 2023-10-08 DIAGNOSIS — S2242XA Multiple fractures of ribs, left side, initial encounter for closed fracture: Secondary | ICD-10-CM | POA: Diagnosis not present

## 2023-10-08 DIAGNOSIS — M810 Age-related osteoporosis without current pathological fracture: Secondary | ICD-10-CM | POA: Diagnosis not present

## 2023-10-08 DIAGNOSIS — Z79890 Hormone replacement therapy (postmenopausal): Secondary | ICD-10-CM | POA: Diagnosis not present

## 2023-10-08 DIAGNOSIS — E8729 Other acidosis: Secondary | ICD-10-CM | POA: Diagnosis not present

## 2023-10-08 DIAGNOSIS — W19XXXD Unspecified fall, subsequent encounter: Secondary | ICD-10-CM | POA: Diagnosis present

## 2023-10-08 DIAGNOSIS — B370 Candidal stomatitis: Secondary | ICD-10-CM | POA: Diagnosis not present

## 2023-10-08 DIAGNOSIS — R413 Other amnesia: Secondary | ICD-10-CM | POA: Diagnosis not present

## 2023-10-08 DIAGNOSIS — S2232XA Fracture of one rib, left side, initial encounter for closed fracture: Secondary | ICD-10-CM | POA: Diagnosis not present

## 2023-10-08 DIAGNOSIS — D6489 Other specified anemias: Secondary | ICD-10-CM | POA: Diagnosis not present

## 2023-10-08 DIAGNOSIS — Z87440 Personal history of urinary (tract) infections: Secondary | ICD-10-CM | POA: Diagnosis not present

## 2023-10-08 DIAGNOSIS — R5381 Other malaise: Secondary | ICD-10-CM | POA: Diagnosis not present

## 2023-10-08 DIAGNOSIS — R4701 Aphasia: Secondary | ICD-10-CM | POA: Diagnosis not present

## 2023-10-08 DIAGNOSIS — A6004 Herpesviral vulvovaginitis: Secondary | ICD-10-CM | POA: Diagnosis not present

## 2023-10-08 DIAGNOSIS — G928 Other toxic encephalopathy: Secondary | ICD-10-CM | POA: Diagnosis not present

## 2023-10-08 DIAGNOSIS — K5900 Constipation, unspecified: Secondary | ICD-10-CM | POA: Diagnosis not present

## 2023-10-08 DIAGNOSIS — E785 Hyperlipidemia, unspecified: Secondary | ICD-10-CM | POA: Diagnosis not present

## 2023-10-08 DIAGNOSIS — Z79899 Other long term (current) drug therapy: Secondary | ICD-10-CM | POA: Diagnosis not present

## 2023-10-08 DIAGNOSIS — I700 Atherosclerosis of aorta: Secondary | ICD-10-CM | POA: Diagnosis not present

## 2023-10-08 DIAGNOSIS — M1611 Unilateral primary osteoarthritis, right hip: Secondary | ICD-10-CM | POA: Diagnosis not present

## 2023-10-08 DIAGNOSIS — D638 Anemia in other chronic diseases classified elsewhere: Secondary | ICD-10-CM | POA: Diagnosis not present

## 2023-10-08 DIAGNOSIS — R4789 Other speech disturbances: Secondary | ICD-10-CM | POA: Diagnosis not present

## 2023-10-08 DIAGNOSIS — E8809 Other disorders of plasma-protein metabolism, not elsewhere classified: Secondary | ICD-10-CM | POA: Diagnosis not present

## 2023-10-08 DIAGNOSIS — I672 Cerebral atherosclerosis: Secondary | ICD-10-CM | POA: Diagnosis not present

## 2023-10-08 DIAGNOSIS — Z806 Family history of leukemia: Secondary | ICD-10-CM | POA: Diagnosis not present

## 2023-10-08 DIAGNOSIS — E049 Nontoxic goiter, unspecified: Secondary | ICD-10-CM | POA: Diagnosis not present

## 2023-10-08 DIAGNOSIS — Z9181 History of falling: Secondary | ICD-10-CM | POA: Diagnosis not present

## 2023-10-08 DIAGNOSIS — R1312 Dysphagia, oropharyngeal phase: Secondary | ICD-10-CM | POA: Diagnosis not present

## 2023-10-08 DIAGNOSIS — Z8249 Family history of ischemic heart disease and other diseases of the circulatory system: Secondary | ICD-10-CM | POA: Diagnosis not present

## 2023-10-08 DIAGNOSIS — I6782 Cerebral ischemia: Secondary | ICD-10-CM | POA: Diagnosis not present

## 2023-10-08 DIAGNOSIS — Z6830 Body mass index (BMI) 30.0-30.9, adult: Secondary | ICD-10-CM | POA: Diagnosis not present

## 2023-10-08 DIAGNOSIS — F4320 Adjustment disorder, unspecified: Secondary | ICD-10-CM | POA: Diagnosis not present

## 2023-10-08 DIAGNOSIS — E663 Overweight: Secondary | ICD-10-CM | POA: Diagnosis not present

## 2023-10-08 DIAGNOSIS — R627 Adult failure to thrive: Secondary | ICD-10-CM | POA: Diagnosis not present

## 2023-10-08 DIAGNOSIS — B9689 Other specified bacterial agents as the cause of diseases classified elsewhere: Secondary | ICD-10-CM | POA: Diagnosis not present

## 2023-10-08 DIAGNOSIS — R569 Unspecified convulsions: Secondary | ICD-10-CM | POA: Diagnosis not present

## 2023-10-08 DIAGNOSIS — G4700 Insomnia, unspecified: Secondary | ICD-10-CM | POA: Diagnosis not present

## 2023-10-08 DIAGNOSIS — I491 Atrial premature depolarization: Secondary | ICD-10-CM | POA: Diagnosis not present

## 2023-10-08 DIAGNOSIS — Z299 Encounter for prophylactic measures, unspecified: Secondary | ICD-10-CM | POA: Diagnosis not present

## 2023-10-08 DIAGNOSIS — E7800 Pure hypercholesterolemia, unspecified: Secondary | ICD-10-CM | POA: Diagnosis not present

## 2023-10-08 DIAGNOSIS — R531 Weakness: Secondary | ICD-10-CM | POA: Diagnosis not present

## 2023-10-08 DIAGNOSIS — N390 Urinary tract infection, site not specified: Secondary | ICD-10-CM | POA: Diagnosis not present

## 2023-10-08 DIAGNOSIS — G9341 Metabolic encephalopathy: Secondary | ICD-10-CM | POA: Diagnosis not present

## 2023-10-08 DIAGNOSIS — Z820 Family history of epilepsy and other diseases of the nervous system: Secondary | ICD-10-CM | POA: Diagnosis not present

## 2023-10-08 DIAGNOSIS — R479 Unspecified speech disturbances: Secondary | ICD-10-CM | POA: Diagnosis not present

## 2023-10-08 DIAGNOSIS — Z90710 Acquired absence of both cervix and uterus: Secondary | ICD-10-CM | POA: Diagnosis not present

## 2023-10-08 LAB — CBC WITH DIFFERENTIAL/PLATELET
Abs Immature Granulocytes: 0.12 10*3/uL — ABNORMAL HIGH (ref 0.00–0.07)
Basophils Absolute: 0.1 10*3/uL (ref 0.0–0.1)
Basophils Relative: 1 %
Eosinophils Absolute: 1.3 10*3/uL — ABNORMAL HIGH (ref 0.0–0.5)
Eosinophils Relative: 11 %
HCT: 30.3 % — ABNORMAL LOW (ref 36.0–46.0)
Hemoglobin: 10.1 g/dL — ABNORMAL LOW (ref 12.0–15.0)
Immature Granulocytes: 1 %
Lymphocytes Relative: 23 %
Lymphs Abs: 2.7 10*3/uL (ref 0.7–4.0)
MCH: 33.4 pg (ref 26.0–34.0)
MCHC: 33.3 g/dL (ref 30.0–36.0)
MCV: 100.3 fL — ABNORMAL HIGH (ref 80.0–100.0)
Monocytes Absolute: 0.9 10*3/uL (ref 0.1–1.0)
Monocytes Relative: 7 %
Neutro Abs: 6.7 10*3/uL (ref 1.7–7.7)
Neutrophils Relative %: 57 %
Platelets: 211 10*3/uL (ref 150–400)
RBC: 3.02 MIL/uL — ABNORMAL LOW (ref 3.87–5.11)
RDW: 13.4 % (ref 11.5–15.5)
WBC: 11.8 10*3/uL — ABNORMAL HIGH (ref 4.0–10.5)
nRBC: 0 % (ref 0.0–0.2)

## 2023-10-08 LAB — COMPREHENSIVE METABOLIC PANEL WITH GFR
ALT: 15 U/L (ref 0–44)
ALT: 15 U/L (ref 0–44)
AST: 22 U/L (ref 15–41)
AST: 23 U/L (ref 15–41)
Albumin: 2.7 g/dL — ABNORMAL LOW (ref 3.5–5.0)
Albumin: 2.8 g/dL — ABNORMAL LOW (ref 3.5–5.0)
Alkaline Phosphatase: 41 U/L (ref 38–126)
Alkaline Phosphatase: 47 U/L (ref 38–126)
Anion gap: 9 (ref 5–15)
Anion gap: 9 (ref 5–15)
BUN: 25 mg/dL — ABNORMAL HIGH (ref 8–23)
BUN: 25 mg/dL — ABNORMAL HIGH (ref 8–23)
CO2: 21 mmol/L — ABNORMAL LOW (ref 22–32)
CO2: 19 mmol/L — ABNORMAL LOW (ref 22–32)
Calcium: 10.5 mg/dL — ABNORMAL HIGH (ref 8.9–10.3)
Calcium: 10.7 mg/dL — ABNORMAL HIGH (ref 8.9–10.3)
Chloride: 113 mmol/L — ABNORMAL HIGH (ref 98–111)
Chloride: 115 mmol/L — ABNORMAL HIGH (ref 98–111)
Creatinine, Ser: 1.04 mg/dL — ABNORMAL HIGH (ref 0.44–1.00)
Creatinine, Ser: 1 mg/dL (ref 0.44–1.00)
GFR, Estimated: 52 mL/min — ABNORMAL LOW (ref >60)
GFR, Estimated: 54 mL/min — ABNORMAL LOW (ref >60)
Glucose, Bld: 107 mg/dL — ABNORMAL HIGH (ref 70–99)
Glucose, Bld: 141 mg/dL — ABNORMAL HIGH (ref 70–99)
Potassium: 3.8 mmol/L (ref 3.5–5.1)
Potassium: 3.8 mmol/L (ref 3.5–5.1)
Sodium: 143 mmol/L (ref 135–145)
Sodium: 143 mmol/L (ref 135–145)
Total Bilirubin: 0.5 mg/dL (ref 0.0–1.2)
Total Bilirubin: 0.2 mg/dL (ref 0.0–1.2)
Total Protein: 5.6 g/dL — ABNORMAL LOW (ref 6.5–8.1)
Total Protein: 6.3 g/dL — ABNORMAL LOW (ref 6.5–8.1)

## 2023-10-08 LAB — URINE CULTURE: Culture: >=100000 — AB

## 2023-10-08 LAB — MAGNESIUM: Magnesium: 1.6 mg/dL — ABNORMAL LOW (ref 1.7–2.4)

## 2023-10-08 LAB — PHOSPHORUS: Phosphorus: 2.6 mg/dL (ref 2.5–4.6)

## 2023-10-08 MED ORDER — CEFUROXIME AXETIL 250 MG PO TABS
500.0000 mg | ORAL_TABLET | Freq: Two times a day (BID) | ORAL | Status: DC
Start: 2023-10-09 — End: 2023-10-09
  Filled 2023-10-08: qty 2

## 2023-10-08 MED ORDER — MAGNESIUM SULFATE 4 GM/100ML IV SOLN
4.0000 g | Freq: Once | INTRAVENOUS | Status: AC
  Administered 2023-10-08: 4 g via INTRAVENOUS
  Filled 2023-10-08: qty 100

## 2023-10-08 MED ORDER — VITAMIN B-1 100 MG PO TABS: 100.0000 mg | ORAL_TABLET | Freq: Every day | ORAL | Status: AC

## 2023-10-08 MED ORDER — AMLODIPINE BESYLATE 5 MG PO TABS
5.0000 mg | ORAL_TABLET | Freq: Every day | ORAL | Status: DC
  Administered 2023-10-08: 5 mg via ORAL
  Filled 2023-10-08: qty 1

## 2023-10-08 MED ORDER — CEFUROXIME AXETIL 500 MG PO TABS: 500.0000 mg | ORAL_TABLET | Freq: Two times a day (BID) | ORAL | Status: AC

## 2023-10-08 MED ORDER — LIDOCAINE 5 % EX PTCH: 1.0000 | MEDICATED_PATCH | Freq: Every day | CUTANEOUS | Status: AC

## 2023-10-08 MED ORDER — ONDANSETRON HCL 4 MG PO TABS: 4.0000 mg | ORAL_TABLET | Freq: Four times a day (QID) | ORAL | Status: AC | PRN

## 2023-10-08 MED ORDER — METHOCARBAMOL 500 MG PO TABS
500.0000 mg | ORAL_TABLET | Freq: Three times a day (TID) | ORAL | Status: AC | PRN
Start: 2023-10-08 — End: ?

## 2023-10-08 MED ORDER — LISINOPRIL 10 MG PO TABS
10.0000 mg | ORAL_TABLET | Freq: Every day | ORAL | Status: DC
  Administered 2023-10-08: 10 mg via ORAL
  Filled 2023-10-08: qty 1

## 2023-10-08 MED ORDER — SODIUM CHLORIDE 0.9 % IV BOLUS
500.0000 mL | Freq: Once | INTRAVENOUS | Status: AC
  Administered 2023-10-08: 500 mL via INTRAVENOUS

## 2023-10-08 MED ORDER — ENSURE ENLIVE PO LIQD: 237.0000 mL | Freq: Two times a day (BID) | ORAL | Status: DC

## 2023-10-08 MED ORDER — ZINC OXIDE 40 % EX OINT: TOPICAL_OINTMENT | Freq: Two times a day (BID) | CUTANEOUS | Status: DC

## 2023-10-08 MED ORDER — SENNOSIDES-DOCUSATE SODIUM 8.6-50 MG PO TABS: 1.0000 | ORAL_TABLET | Freq: Every evening | ORAL | Status: DC | PRN

## 2023-10-08 NOTE — Progress Notes (Signed)
 Report called to [facility] [phone] ). Spoke with nurse  Tamina who will be taking care of pt in [address] when she arrives.Nurse verbalized understanding of all teaching and had no further questions.

## 2023-10-08 NOTE — Plan of Care (Signed)

## 2023-10-08 NOTE — Discharge Summary (Signed)
 Physician Discharge Summary   Patient: Kimberly Webb MRN: [id number] DOB: 19-Oct-1932  Admit date:     10/02/2023  Discharge date: 10/08/23  Discharge Physician: Aura Leeds, DO   PCP: Brenita Callow, Rolena Click.Rozelle Corning, MD (Inactive)   Recommendations at discharge:   Follow-up with PCP within 1 to 2 weeks repeat CBC, CMP, mag, Phos within 1 week Follow-up with Neurology in outpatient setting and have neuropsychiatric testing done to evaluate for her forgetfulness and likely dementia Follow-up with Endocrinology in outpatient setting and follow-up on the pending labs of the PT RHP and a PTH given her hypercalcemia Palliative care to follow at SNF  Discharge Diagnoses: Principal Problem:   AKI (acute kidney injury) (HCC) Active Problems:   Failure to thrive in adult   Hypercalcemia   Generalized weakness   Multiple closed fractures of ribs of left side   Protein-calorie malnutrition, severe  Resolved Problems:   * No resolved hospital problems. [facility] Course: Patient is an 89 year old Caucasian female with a past medical history significant for HTN, HLD, hypothyroidism, osteoporosis and arthritis who presented to the ED for evaluation of generalized weakness after recent fall approximately 10 days ago. During that incidence he was evaluated at [facility] and had a negative trauma scan.   Since then Patient was initially able to ambulate with a walker even after the fall however over the last few days she has become significantly fatigued and weak and has had extremely poor p.o. intake. She was evaluated by PCP who advised her to come to the ED and trauma surgery evaluated her given her multiple rib fractures.  She is being admitted for failure to thrive, acute metabolic encephalopathy and acute kidney injury, UTI as well as hypercalcemia.  She remains on IV fluids and palliative care medicine has been consulted for goals of care discussion. Mentation is improving and Ca2+ is trending down  and she is improving. U/A and consistent with UTI and now on ABx and PT/OT recommending SNF.  She appears to have stabilized and is much improved and can be discharged to SNF today with outpatient PCP follow-up as well as Endocrinology follow-up  Assessment and Plan:  Failure to Thrive: Had poor p.o. intake last week after her recent fall.  See workup as below.  PT/OT recommending SNF. Getting IV fluid hydration as below.  Nutritionist Consulted;  SLP evaluation given that she had trouble difficulty swallowing her pills and now on D1 Thin Liquid diet. UDS negative and TSH WNL. Checked U/A and Urine Cx as below. Palliative consulted for goals of care discussion and met on 5/4.Aaron Aas Head CT and Cervical Spine CT done and no acute intracranial abnormalities or significant interval change but did show remote lacunar infarcts in the basal ganglia and thalamic bilaterally and along with stable atrophy and white matter disease likely reflecting sequela of chronic microvascular ischemia.  If her electrolytes are corrected and calcium improves will consider EEG and MRI of the brain if she remains altered however she is clearer and appears baseline. Will benefit from SNF and continued Strength Training.   Acute Metabolic Encephalopathy: In the setting of HyperCa2+ and improving.  C/w IVF hydration as below; Calcitonin is complete.  TSH was 1.569. If electrolytes are corrected and still encephalopathic will consider other etiologies of AMS and will obtain MRI of the brain and EEG. Will need outpatient Neurological evaluation and Neuropsychiatric testing for her forgetfulness. Checked U/A and Urine Cx and as below; UDS Negative. Appears to improve with her  mentation.   E Coli, Proteus, and Aerococcus UTI, poA: UA showed a hazy appearance with moderate leukocytes, positive nitrites, rare bacteria, 0-5 RBCs per high-power field, 0-5 squamous epithelial cells and 11-20 WBCs urine culture done and showed >100,000 CFU of E Coli  and 10,000 CFU of Proteus Mirabilis and Now >100,000 CFU of Aerococcus Species with Cx pending. C/w IV Ceftriaxone and change to po Cefuroxime; WBC is trending down and still mildly elevated at 11.8  AKI / Metabolic Acidosis: Improving Has had recent failure of breath with decreased p.o. intake after recent fall; BUN/Cr Trend went from 71/2.41 -> 25/1.04 -Status post 2 L IV NS and 2 L  LR bolus followed by mIVF with LR at 100 mL/hr; Now being continued on NS @ 75 mL/hr with 500 mL Bolus to be given today; MA is mild with a CO2 of 21, AG of 9, Chloride Level of 113 -Check U/A as below Urine Cx and still pending.  If necessary will obtain urine osmolality, urine sodium, urine creatinine and renal ultrasound -Avoid Nephrotoxic Medications, Contrast Dyes, Hypotension and Dehydration to Ensure Adequate Renal Perfusion and will need to Renally Adjust Med. CTM and Trend Renal Function carefully and repeat CMP in the AM    Hypercalcemia: Ca2+ of 15.0 on admission with AMS on exam but does have Has history of mild HyperCa2+ with Ca2+ of 11.2 ~ 4 years ago; Hypercalcemia 2/2 to acute kidney injury. Protein Level on the low side. Checking PTHrP (pending) and PTH (pending).  Vitamin D , 25-Hydroxy was 74.72. Improving with IVF hydration and changed to NS @ 75 mL/hr and will be continued for another 1 day at least. Will give her another 500 mL bolus today prior to D/C. Last Ca2+ is now 10.5.  Holding off IV bisphosphonate. S/p subcu calcitonin twice daily x 4 doses. Ionized Ca2+ was 1.87. CTM and Trend and repeat CMP w/in 1 week; Would benefit from Endocrine referral   Leukocytosis in the setting of UTI and Dehydration: Initially Worsening and now Trending down; WBC went from 14.5 -> 18.6 -> 16.0 -> 16.0 -> 13.5 -> 13.6 -> 11.8. Check for Infection and U/A and Urine Cx not done on Admission so ordering now.  Urine showed a hazy appearance with moderate leukocytes, positive nitrites, rare bacteria, 0-5 RBCs per  high-powered field, 0-5 squamous epithelial cells and 11-20 WBCs. Urine Cx as above/ Started IV Ceftriaxone and will change to po Cefuroxime for D/C  Hypomagnesemia: Mag Level now 1.6. Replete with IV Mag Sulfate 4 grams. CTM and Replete as necessary. Repeat Mag Level in the AM  Hypophosphatemia: Phos Level now 2.6. CTM and Repeat Phos Level w/in 1 week  Dysphagia: Nursing reported some trouble swallowing Pills. SLP consulted.    Multiple Rib Fx in the setting of Recent fall /  Generalized Weakness / Physical Deconditioning and Debility: Patient with a reported for 10 days ago with negative workup at [facility]. Trauma scan shows acute minimally displaced fractures of the L 3rd-7th ribs as well as Displaced fracture of the left lateral 9th rib.  Patient with no significant pain or tenderness to palpation of the ribs. Trauma surgery signed off. C/w Pain control with lidocaine  patch, scheduled Tylenol , as needed IV Dilaudid . Incentive Spirometry. PT/OT eval and treat. Fall precautions;    Osteoporosis: On Prolia  every 6 months. Vitamin D  level (74.72) WNL. C/w Multivitamin   Essential HTN: Resume Home ACE-I. Continue to Hold hydrochlorothiazide .. CTM BP per Protocol. Last BP reading was 176/86  HLD/Hypercholesteremia: C/w Simvastatin  40 mg po Daily and Omega-3 fatty acid   Hypothyroidism: TSH (1.569) and free T4 (1.10) WNL.C/w Levothyroxine  100 mcg if able to tolerate po  Normocytic Anemia: Hgb/Hct went from 13.3/40.0 -> 11.9/36.0 -> 11.9/35.4 -> 10.3/31.3 -> 10.4/32.6 -> 10.1/30.3; Likely hemoconcentrated on admission. Check Anemia Panel and showed an iron level of 38, UIBC 164, TIBC of 202, saturation of 19%, ferritin level 329, folate level of 19.2 and a vitamin B12 level of 340. CTM for S/Sx of Bleeding; No overt bleeding noted. Repeat CBC w/in 1 week  Hyperbilirubinemia: Mild and Likely reactive. T Bili went from 1.5 (at its peak) -> 0.5 now. CTM and trend and repeat CMP w/in 1 week   Overweight:  Complicates overall prognosis and care. Estimated body mass index is 26.52 kg/m as calculated from the following:   Height as of this encounter: 5\' 2"  (1.575 m).   Weight as of this encounter: 65.8 kg. Weight Loss and Dietary Counseling given  Hypoalbuminemia: Patient's Albumin Level is now 2.7. CTM and Trend and repeat CMP w/in 1 week  Severe Malnutrition in the Context of Acute illness/injury: Nutrition Status: Nutrition Problem: Severe Malnutrition Etiology: acute illness Signs/Symptoms: severe muscle depletion, moderate fat depletion, percent weight loss (15 lbs, 9% in 3 months.) Percent weight loss: 9 % Interventions: Refer to RD note for recommendations Nutrition Documentation    Flowsheet Row ED to Hosp-Admission (Current) from 10/02/2023 in [facility]  SURGICAL  Nutrition Problem Severe Malnutrition  Etiology acute illness  Nutrition Goal Patient will meet greater than or equal to 90% of their needs  Interventions Refer to RD note for recommendations      Consultants: Palliative Care Medicine  Procedures performed: As delineated as above   Disposition: Skilled nursing facility  Diet recommendation:  Cardiac diet - Dysphagia 1 Diet  DISCHARGE MEDICATION: Allergies as of 10/08/2023   No Known Allergies      Medication List     STOP taking these medications    diphenhydramine-acetaminophen  25-500 MG Tabs tablet Commonly known as: TYLENOL  PM   hydrochlorothiazide  25 MG tablet Commonly known as: HYDRODIURIL        TAKE these medications    acetaminophen  650 MG CR tablet Commonly known as: TYLENOL  Take 650 mg by mouth every 8 (eight) hours as needed for pain.   acyclovir  400 MG tablet Commonly known as: ZOVIRAX  Take 400 mg by mouth daily.   amLODipine  5 MG tablet Commonly known as: NORVASC  Take 5 mg by mouth daily.   cefUROXime 500 MG tablet Commonly known as: CEFTIN Take 1 tablet (500 mg total) by mouth 2 (two) times daily  with a meal for 3 days. Start taking on: Oct 09, 2023   denosumab  60 MG/ML Soln injection Commonly known as: PROLIA  Inject 60 mg into the skin every 6 (six) months. Administer in upper arm, thigh, or abdomen   feeding supplement Liqd Take 237 mLs by mouth 2 (two) times daily between meals.   FISH OIL PO Take 1 capsule by mouth daily.   GLUCOSAMINE PO Take 1 Dose by mouth daily.   levothyroxine  100 MCG tablet Commonly known as: SYNTHROID  Take 100 mcg by mouth every morning.   lidocaine  5 % Commonly known as: LIDODERM  Place 1 patch onto the skin at bedtime. Remove & Discard patch within 12 hours or as directed by MD   lisinopril  10 MG tablet Commonly known as: ZESTRIL  Take 10 mg by mouth daily.   liver oil-zinc  oxide 40 % ointment Commonly known as: DESITIN Apply topically 2 (two) times daily.   methocarbamol  500 MG tablet Commonly known as: ROBAXIN  Take 1 tablet (500 mg total) by mouth every 8 (eight) hours as needed for muscle spasms (pain).   multivitamin capsule Take 1 capsule by mouth daily.   niacin  500 MG tablet Commonly known as: VITAMIN B3 Take 500 mg by mouth at bedtime.   ondansetron  4 MG tablet Commonly known as: ZOFRAN  Take 1 tablet (4 mg total) by mouth every 6 (six) hours as needed for nausea.   senna-docusate 8.6-50 MG tablet Commonly known as: Senokot-S Take 1 tablet by mouth at bedtime as needed for mild constipation.   simvastatin  40 MG tablet Commonly known as: ZOCOR  Take 40 mg by mouth at bedtime.   thiamine  100 MG tablet Commonly known as: Vitamin B-1 Take 1 tablet (100 mg total) by mouth daily. Start taking on: Oct 09, 2023               Discharge Care Instructions  (From admission, onward)           Start     Ordered   10/08/23 0000  Discharge wound care:       Comments: Cleanse sacrum/buttocks with soap and water, dry and apply a thin layer of Desitin 2 times daily and prn soiling.  Apply silicone foam to sacrum, lift  daily to assess area. Change foam q3 days and prn soiling   10/08/23 1430            Contact information for after-discharge care     Destination     [facility] .   Service: Skilled Nursing Contact information: [address] [place]  [id number] [phone]                    Discharge Exam: Cleavon Curls Weights   10/02/23 1429  Weight: 65.8 kg   Vitals:   10/08/23 0827 10/08/23 1127  BP: (!) 183/91 (!) 176/86  Pulse: 66   Resp: 18   Temp: 98 F (36.7 C)   SpO2: 99%    Examination: Physical Exam:  Constitutional: Overweight Caucasian female who is awake and alert no acute distress appears calm sitting in chair at bedside Respiratory: Diminished to auscultation bilaterally with some coarse breath sounds, no wheezing, rales, rhonchi or crackles. Normal respiratory effort and patient is not tachypenic. No accessory muscle use.  Unlabored breathing Cardiovascular: RRR, no murmurs / rubs / gallops. S1 and S2 auscultated. No extremity edema.  Abdomen: Soft, non-tender, non-distended. Bowel sounds positive.  GU: Deferred. Musculoskeletal: No clubbing / cyanosis of digits/nails. No joint deformity in the upper and lower extremities. Skin: No rashes, lesions, ulcers on limited skin evaluation. No induration; Warm and dry.  Neurologic: CN 2-12 grossly intact with no focal deficits.  Romberg sign and cerebellar reflexes not assessed.  Psychiatric: Normal judgment and insight.  She is awake and alert and oriented  Condition at discharge: stable  The results of significant diagnostics from this hospitalization (including imaging, microbiology, ancillary and laboratory) are listed below for reference.   Imaging Studies: CT CHEST ABDOMEN PELVIS WO CONTRAST Result Date: 10/02/2023 CLINICAL DATA:  Blunt polytrauma. Patient fell 10 days ago. Bilateral weakness. Patient declined from walking with a walker to being wheelchair-bound over the last 10  days. EXAM: CT CHEST, ABDOMEN AND PELVIS WITHOUT CONTRAST TECHNIQUE: Multidetector CT imaging of the chest, abdomen and pelvis was performed following the standard protocol  without IV contrast. RADIATION DOSE REDUCTION: This exam was performed according to the departmental dose-optimization program which includes automated exposure control, adjustment of the mA and/or kV according to patient size and/or use of iterative reconstruction technique. COMPARISON:  Same day chest and pelvis radiographs FINDINGS: CT CHEST FINDINGS Cardiovascular: No pericardial effusion. Coronary artery and aortic atherosclerotic calcification. Evaluation for aortic injury is limited without IV contrast. Mediastinum/Nodes: Trachea and esophagus are unremarkable. No mediastinal hematoma. Lungs/Pleura: Respiratory motion obscures detail. Hypoventilation changes in the lower lobes. Bilateral lower lobe scarring. No pleural effusion or pneumothorax. Musculoskeletal: Acute minimally displaced fractures of the left anterior 3rd-7th ribs. Displaced fracture of the left lateral ninth rib. Subacute or chronic fracture of the posterior left tenth rib. CT ABDOMEN PELVIS FINDINGS Hepatobiliary: Gallbladder sludge. No evidence of acute cholecystitis. Unremarkable noncontrast appearance of the liver. Pancreas: No acute abnormality. Spleen: Unremarkable. Adrenals/Urinary Tract: No adrenal hemorrhage or renal injury identified. Bladder is unremarkable. Stomach/Bowel: Normal caliber large and small bowel. Colonic diverticulosis without diverticulitis. No bowel wall thickening. Stomach is within normal limits. Vascular/Lymphatic: Aortic atherosclerosis. No enlarged abdominal or pelvic lymph nodes. Reproductive: Hysterectomy. Other: No free intraperitoneal fluid or air. Musculoskeletal: No acute fracture. IMPRESSION: 1. Acute minimally displaced fractures of the left anterior 3rd-7th ribs. Displaced fracture of the left lateral 9th rib. 2. No evidence of  acute traumatic injury in the abdomen or pelvis. 3. Aortic Atherosclerosis (ICD10-I70.0). Electronically Signed   By: Rozell Cornet M.D.   On: 10/02/2023 19:17   CT HEAD WO CONTRAST Result Date: 10/02/2023 CLINICAL DATA:  Mental status change, persistent or worsening. EXAM: CT HEAD WITHOUT CONTRAST TECHNIQUE: Contiguous axial images were obtained from the base of the skull through the vertex without intravenous contrast. RADIATION DOSE REDUCTION: This exam was performed according to the departmental dose-optimization program which includes automated exposure control, adjustment of the mA and/or kV according to patient size and/or use of iterative reconstruction technique. COMPARISON:  CT head without contrast 09/23/2023 at [facility] [employer]. FINDINGS: Brain: Remote lacunar infarcts are again noted within the basal ganglia and thalami bilaterally. No acute infarct, hemorrhage, or mass lesion is present. Moderate atrophy and white matter disease is stable. The ventricles are of normal size. No significant extraaxial fluid collection is present. The brainstem and cerebellum are within normal limits. Midline structures are within normal limits. Vascular: No hyperdense vessel or unexpected calcification. Skull: Calvarium is intact. No focal lytic or blastic lesions are present. No significant extracranial soft tissue lesion is present. Sinuses/Orbits: The paranasal sinuses and mastoid air cells are clear. Bilateral lens replacements are noted. Globes and orbits are otherwise unremarkable. IMPRESSION: 1. No acute intracranial abnormality or significant interval change. 2. Remote lacunar infarcts of the basal ganglia and thalami bilaterally. 3. Stable atrophy and white matter disease. This likely reflects the sequela of chronic microvascular ischemia. Electronically Signed   By: Audree Leas M.D.   On: 10/02/2023 19:05   CT Cervical Spine Wo Contrast Result Date: 10/02/2023 CLINICAL DATA:   Ataxia. Cervical trauma. Fall 10 days ago. Patient has declined from walking with a walker to now wheelchair-bound. EXAM: CT CERVICAL SPINE WITHOUT CONTRAST TECHNIQUE: Multidetector CT imaging of the cervical spine was performed without intravenous contrast. Multiplanar CT image reconstructions were also generated. RADIATION DOSE REDUCTION: This exam was performed according to the departmental dose-optimization program which includes automated exposure control, adjustment of the mA and/or kV according to patient size and/or use of iterative reconstruction technique. COMPARISON:  CT of the cervical  spine 01/11/2020 FINDINGS: Alignment: Slight degenerative anterolisthesis at C4-5 is similar to prior studies. Straightening of the normal cervical lordosis is present. Chronic leftward curvature of the lower cervical spine is noted. Skull base and vertebrae: A chronic non healed fracture is present in the right posterior arch of C1. No additional C1 fracture is present. A remote oblique healed fracture is present in the body of C2 on the left. No acute fractures are present in the cervical spine. Soft tissues and spinal canal: No prevertebral fluid or swelling. No visible canal hematoma. Right thyroidectomy is noted. Disc levels: Chronic endplate degenerative changes are greatest at C3-4, C5-6 and C6-7. Facet spurring contributes 2 bilateral foraminal stenosis at C4-5. No focal osseous lesions are present. Upper chest: The lung apices are clear. The thoracic inlet is within normal limits. IMPRESSION: 1. No acute fracture or traumatic subluxation. 2. Chronic non healed fracture in the right posterior arch of C1. 3. Remote oblique healed fracture in the body of C2 on the left. 4. Chronic degenerative changes of the cervical spine as described. Electronically Signed   By: Audree Leas M.D.   On: 10/02/2023 19:01   DG Chest Port 1 View Result Date: 10/02/2023 CLINICAL DATA:  Fall 10 days ago.  Altered mental  status. EXAM: PORTABLE CHEST 1 VIEW COMPARISON:  06/24/2023. FINDINGS: Stable cardiomediastinal silhouette. Aortic atherosclerosis. No focal consolidation, sizeable pleural effusion, or appreciable pneumothorax. Mildly displaced fracture of the left lateral eighth rib, likely acute to subacute. Remote healed fracture of the right proximal humerus. IMPRESSION: Mildly displaced fracture of the left lateral eighth rib, likely acute to subacute. No appreciable pneumothorax. Electronically Signed   By: Mannie Seek M.D.   On: 10/02/2023 15:12   DG Pelvis Portable Result Date: 10/02/2023 CLINICAL DATA:  Fall 10 days ago with increasing weakness. EXAM: PORTABLE PELVIS 1-2 VIEWS COMPARISON:  09/23/2023 FINDINGS: Overlapping telemetry wires. No acute fracture or dislocation identified. Femoral heads are seated within the acetabula. Sacroiliac joints and pubic symphysis are anatomically aligned. Mild degenerative changes of the bilateral hips. Degenerative changes of the visualized lower lumbar spine. Vascular calcifications are noted. IMPRESSION: No acute osseous abnormality identified on single AP pelvic radiograph. Electronically Signed   By: Mannie Seek M.D.   On: 10/02/2023 15:08   Microbiology: Results for orders placed or performed during the hospital encounter of 10/02/23  Urine Culture (for pregnant, neutropenic or urologic patients or patients with an indwelling urinary catheter)     Status: Abnormal   Collection Time: 10/05/23 10:14 AM   Specimen: Urine, Catheterized  Result Value Ref Range Status   Specimen Description URINE, CATHETERIZED  Final   Special Requests NONE  Final   Culture (A)  Final    >=100,000 COLONIES/mL ESCHERICHIA COLI 10,000 COLONIES/mL PROTEUS MIRABILIS >=100,000 COLONIES/mL AEROCOCCUS SPECIES Standardized susceptibility testing for this organism is not available. Performed at [facility], [address] [place], [place] [address]    Report Status  10/08/2023 FINAL  Final   Organism ID, Bacteria ESCHERICHIA COLI (A)  Final   Organism ID, Bacteria PROTEUS MIRABILIS (A)  Final      Susceptibility   Escherichia coli - MIC*    AMPICILLIN <=2 SENSITIVE Sensitive     CEFAZOLIN <=4 SENSITIVE Sensitive     CEFEPIME <=0.12 SENSITIVE Sensitive     CEFTRIAXONE <=0.25 SENSITIVE Sensitive     CIPROFLOXACIN  >=4 RESISTANT Resistant     GENTAMICIN <=1 SENSITIVE Sensitive     IMIPENEM 1 SENSITIVE Sensitive  NITROFURANTOIN <=16 SENSITIVE Sensitive     TRIMETH/SULFA <=20 SENSITIVE Sensitive     AMPICILLIN/SULBACTAM <=2 SENSITIVE Sensitive     PIP/TAZO <=4 SENSITIVE Sensitive ug/mL    * >=100,000 COLONIES/mL ESCHERICHIA COLI   Proteus mirabilis - MIC*    AMPICILLIN <=2 SENSITIVE Sensitive     CEFAZOLIN <=4 SENSITIVE Sensitive     CEFEPIME <=0.12 SENSITIVE Sensitive     CEFTRIAXONE <=0.25 SENSITIVE Sensitive     CIPROFLOXACIN  <=0.25 SENSITIVE Sensitive     GENTAMICIN <=1 SENSITIVE Sensitive     IMIPENEM 2 SENSITIVE Sensitive     NITROFURANTOIN 128 RESISTANT Resistant     TRIMETH/SULFA <=20 SENSITIVE Sensitive     AMPICILLIN/SULBACTAM <=2 SENSITIVE Sensitive     PIP/TAZO <=4 SENSITIVE Sensitive ug/mL    * 10,000 COLONIES/mL PROTEUS MIRABILIS   Labs: CBC: Recent Labs  Lab 10/04/23 0456 10/05/23 0459 10/06/23 0339 10/07/23 0723 10/08/23 0630  WBC 16.0* 16.0* 13.5* 13.6* 11.8*  NEUTROABS 11.9* 11.7* 9.4* 8.4* 6.7  HGB 11.9* 11.9* 10.3* 10.4* 10.1*  HCT 36.0 35.4* 31.3* 31.6* 30.3*  MCV 99.7 98.6 100.3* 99.4 100.3*  PLT 242 218 180 211 211   Basic Metabolic Panel: Recent Labs  Lab 10/04/23 0456 10/05/23 0459 10/06/23 0339 10/07/23 0723 10/08/23 0630  NA 142 143 145 143 143  K 4.5 4.3 3.7 3.8 3.8  CL 109 111 114* 113* 113*  CO2 20* 23 24 22  21*  GLUCOSE 91 115* 122* 108* 107*  BUN 29* 27* 27* 23 25*  CREATININE 1.31* 1.21* 1.19* 1.06* 1.04*  CALCIUM 12.0* 11.2* 10.9* 10.7* 10.5*  MG 1.6* 2.0 1.6* 1.9 1.6*  PHOS 1.6* 2.4*  1.8* 2.9 2.6   Liver Function Tests: Recent Labs  Lab 10/04/23 0456 10/05/23 0459 10/06/23 0339 10/07/23 0723 10/08/23 0630  AST 33 24 17 19 22   ALT 14 14 12 15 15   ALKPHOS 42 45 39 43 41  BILITOT 1.5* 1.0 0.3 0.4 0.5  PROT 6.7 6.5 6.0* 6.0* 5.6*  ALBUMIN 3.7 3.5 3.0* 2.9* 2.7*   CBG: Recent Labs  Lab 10/02/23 1441  GLUCAP 126*   Discharge time spent: greater than 30 minutes.  Signed: Aura Leeds, DO [facility] 10/08/2023

## 2023-10-08 NOTE — TOC Transition Note (Signed)
 Transition of Care [facility]) - Discharge Note   Patient Details  Name: Kimberly Webb MRN: [id number] Date of Birth: 12/28/1932  Transition of Care [facility]) CM/SW Contact:  Trevion Hoben A [place], LCSW Phone Number: 10/08/2023, 3:24 PM   Clinical Narrative:      Patient will DC to: [facility]  Anticipated DC date: 10/08/23  Family notified: Jacques Mattock  Transport by: Lyna Sandhoff      Per MD patient ready for DC to [facility]. RN, patient, patient's family, and facility notified of DC. Discharge Summary and FL2 sent to facility. RN to call report prior to discharge (Room [address], [phone]). DC packet on chart. Ambulance transport requested for patient.     CSW will sign off for now as social work intervention is no longer needed. Please consult us  again if new needs arise.   Final next level of care: Skilled Nursing Facility Barriers to Discharge: Barriers Resolved   Patient Goals and CMS Choice Patient states their goals for this hospitalization and ongoing recovery are:: Rehab CMS Medicare.gov Compare Post Acute Care list provided to:: Patient Represenative (must comment) Choice offered to / list presented to : Adult Children [place] ownership interest in [facility].provided to:: Adult Children    Discharge Placement              Patient chooses bed at: [facility] Patient to be transferred to facility by: [facility] Name of family member notified: Jacques Mattock Patient and family notified of of transfer: 10/08/23  Discharge Plan and Services Additional resources added to the After Visit Summary for   In-house Referral: Clinical Social Work   Post Acute Care Choice: Skilled Nursing Facility                               Social Drivers of Health (SDOH) Interventions SDOH Screenings   Utilities: Patient Unable To Answer (10/02/2023)  Social Connections: Unknown (10/03/2023)  Tobacco Use: Medium Risk (10/02/2023)     Readmission Risk Interventions      No data to display

## 2023-10-08 NOTE — TOC Progression Note (Signed)
 Transition of Care [facility]) - Progression Note    Patient Details  Name: Kimberly Webb MRN: [id number] Date of Birth: 1933/02/23  Transition of Care [facility]) CM/SW Contact  Gola Bribiesca A [place], LCSW Phone Number: 10/08/2023, 9:29 AM  Clinical Narrative:     CSW spoke with [facility], bed available today, in the afternoon, once pt's daughter, [name] completes paperwork.   Possible DC today.   TOC will continue to follow.   Expected Discharge Plan: Skilled Nursing Facility Barriers to Discharge: Continued Medical Work up, SNF Pending bed offer  Expected Discharge Plan and Services In-house Referral: Clinical Social Work   Post Acute Care Choice: Skilled Nursing Facility Living arrangements for the past 2 months: Single Family Home                                       Social Determinants of Health (SDOH) Interventions SDOH Screenings   Utilities: Patient Unable To Answer (10/02/2023)  Social Connections: Unknown (10/03/2023)  Tobacco Use: Medium Risk (10/02/2023)    Readmission Risk Interventions     No data to display

## 2023-10-09 DIAGNOSIS — A6004 Herpesviral vulvovaginitis: Secondary | ICD-10-CM | POA: Diagnosis not present

## 2023-10-09 DIAGNOSIS — F4320 Adjustment disorder, unspecified: Secondary | ICD-10-CM | POA: Diagnosis not present

## 2023-10-09 DIAGNOSIS — N390 Urinary tract infection, site not specified: Secondary | ICD-10-CM | POA: Diagnosis not present

## 2023-10-09 DIAGNOSIS — F5101 Primary insomnia: Secondary | ICD-10-CM | POA: Diagnosis not present

## 2023-10-09 DIAGNOSIS — S2242XD Multiple fractures of ribs, left side, subsequent encounter for fracture with routine healing: Secondary | ICD-10-CM | POA: Diagnosis not present

## 2023-10-10 DIAGNOSIS — B964 Proteus (mirabilis) (morganii) as the cause of diseases classified elsewhere: Secondary | ICD-10-CM | POA: Diagnosis not present

## 2023-10-10 DIAGNOSIS — M810 Age-related osteoporosis without current pathological fracture: Secondary | ICD-10-CM | POA: Diagnosis not present

## 2023-10-10 DIAGNOSIS — E039 Hypothyroidism, unspecified: Secondary | ICD-10-CM | POA: Diagnosis not present

## 2023-10-10 DIAGNOSIS — S2242XD Multiple fractures of ribs, left side, subsequent encounter for fracture with routine healing: Secondary | ICD-10-CM | POA: Diagnosis not present

## 2023-10-10 DIAGNOSIS — I10 Essential (primary) hypertension: Secondary | ICD-10-CM | POA: Diagnosis not present

## 2023-10-10 DIAGNOSIS — R627 Adult failure to thrive: Secondary | ICD-10-CM | POA: Diagnosis not present

## 2023-10-10 DIAGNOSIS — E785 Hyperlipidemia, unspecified: Secondary | ICD-10-CM | POA: Diagnosis not present

## 2023-10-10 LAB — PTH, INTACT AND CALCIUM
Calcium, Total (PTH): 10.7 mg/dL — ABNORMAL HIGH (ref 8.7–10.3)
PTH: 33 pg/mL (ref 15–65)

## 2023-10-14 ENCOUNTER — Inpatient Hospital Stay (HOSPITAL_COMMUNITY)
Admission: EM | Admit: 2023-10-14 | Discharge: 2023-10-19 | DRG: 092 | Disposition: A | Discharge status: Skilled Nursing Facility | Source: Skilled Nursing Facility | Attending: Internal Medicine | Admitting: Internal Medicine

## 2023-10-14 ENCOUNTER — Emergency Department (HOSPITAL_COMMUNITY)

## 2023-10-14 ENCOUNTER — Encounter (HOSPITAL_COMMUNITY): Payer: Self-pay

## 2023-10-14 ENCOUNTER — Other Ambulatory Visit: Payer: Self-pay

## 2023-10-14 ENCOUNTER — Observation Stay (HOSPITAL_COMMUNITY)

## 2023-10-14 DIAGNOSIS — R5383 Other fatigue: Secondary | ICD-10-CM | POA: Diagnosis not present

## 2023-10-14 DIAGNOSIS — E7220 Disorder of urea cycle metabolism, unspecified: Secondary | ICD-10-CM | POA: Diagnosis present

## 2023-10-14 DIAGNOSIS — Z806 Family history of leukemia: Secondary | ICD-10-CM

## 2023-10-14 DIAGNOSIS — Z79890 Hormone replacement therapy (postmenopausal): Secondary | ICD-10-CM

## 2023-10-14 DIAGNOSIS — I700 Atherosclerosis of aorta: Secondary | ICD-10-CM | POA: Diagnosis not present

## 2023-10-14 DIAGNOSIS — D72829 Elevated white blood cell count, unspecified: Secondary | ICD-10-CM

## 2023-10-14 DIAGNOSIS — E049 Nontoxic goiter, unspecified: Secondary | ICD-10-CM | POA: Diagnosis not present

## 2023-10-14 DIAGNOSIS — B964 Proteus (mirabilis) (morganii) as the cause of diseases classified elsewhere: Secondary | ICD-10-CM | POA: Diagnosis present

## 2023-10-14 DIAGNOSIS — I672 Cerebral atherosclerosis: Secondary | ICD-10-CM | POA: Diagnosis not present

## 2023-10-14 DIAGNOSIS — Z79899 Other long term (current) drug therapy: Secondary | ICD-10-CM | POA: Diagnosis not present

## 2023-10-14 DIAGNOSIS — N179 Acute kidney failure, unspecified: Secondary | ICD-10-CM | POA: Diagnosis not present

## 2023-10-14 DIAGNOSIS — I10 Essential (primary) hypertension: Secondary | ICD-10-CM | POA: Diagnosis not present

## 2023-10-14 DIAGNOSIS — S2242XD Multiple fractures of ribs, left side, subsequent encounter for fracture with routine healing: Secondary | ICD-10-CM | POA: Diagnosis not present

## 2023-10-14 DIAGNOSIS — M1611 Unilateral primary osteoarthritis, right hip: Secondary | ICD-10-CM | POA: Diagnosis not present

## 2023-10-14 DIAGNOSIS — M810 Age-related osteoporosis without current pathological fracture: Secondary | ICD-10-CM | POA: Diagnosis present

## 2023-10-14 DIAGNOSIS — R9089 Other abnormal findings on diagnostic imaging of central nervous system: Secondary | ICD-10-CM | POA: Diagnosis not present

## 2023-10-14 DIAGNOSIS — R29705 NIHSS score 5: Secondary | ICD-10-CM | POA: Diagnosis present

## 2023-10-14 DIAGNOSIS — R0789 Other chest pain: Secondary | ICD-10-CM | POA: Diagnosis not present

## 2023-10-14 DIAGNOSIS — R4789 Other speech disturbances: Secondary | ICD-10-CM | POA: Diagnosis not present

## 2023-10-14 DIAGNOSIS — I6523 Occlusion and stenosis of bilateral carotid arteries: Secondary | ICD-10-CM | POA: Diagnosis not present

## 2023-10-14 DIAGNOSIS — Z8673 Personal history of transient ischemic attack (TIA), and cerebral infarction without residual deficits: Secondary | ICD-10-CM | POA: Diagnosis not present

## 2023-10-14 DIAGNOSIS — G928 Other toxic encephalopathy: Secondary | ICD-10-CM | POA: Diagnosis not present

## 2023-10-14 DIAGNOSIS — M1612 Unilateral primary osteoarthritis, left hip: Secondary | ICD-10-CM | POA: Diagnosis not present

## 2023-10-14 DIAGNOSIS — G9340 Encephalopathy, unspecified: Secondary | ICD-10-CM | POA: Diagnosis present

## 2023-10-14 DIAGNOSIS — B9620 Unspecified Escherichia coli [E. coli] as the cause of diseases classified elsewhere: Secondary | ICD-10-CM | POA: Diagnosis not present

## 2023-10-14 DIAGNOSIS — R471 Dysarthria and anarthria: Secondary | ICD-10-CM | POA: Diagnosis present

## 2023-10-14 DIAGNOSIS — R4701 Aphasia: Secondary | ICD-10-CM | POA: Diagnosis not present

## 2023-10-14 DIAGNOSIS — Z90710 Acquired absence of both cervix and uterus: Secondary | ICD-10-CM

## 2023-10-14 DIAGNOSIS — W19XXXD Unspecified fall, subsequent encounter: Secondary | ICD-10-CM | POA: Diagnosis present

## 2023-10-14 DIAGNOSIS — Z87440 Personal history of urinary (tract) infections: Secondary | ICD-10-CM | POA: Diagnosis not present

## 2023-10-14 DIAGNOSIS — N390 Urinary tract infection, site not specified: Secondary | ICD-10-CM | POA: Diagnosis not present

## 2023-10-14 DIAGNOSIS — E7800 Pure hypercholesterolemia, unspecified: Secondary | ICD-10-CM | POA: Diagnosis not present

## 2023-10-14 DIAGNOSIS — E039 Hypothyroidism, unspecified: Secondary | ICD-10-CM | POA: Diagnosis present

## 2023-10-14 DIAGNOSIS — D638 Anemia in other chronic diseases classified elsewhere: Secondary | ICD-10-CM | POA: Diagnosis present

## 2023-10-14 DIAGNOSIS — R627 Adult failure to thrive: Secondary | ICD-10-CM | POA: Diagnosis present

## 2023-10-14 DIAGNOSIS — R479 Unspecified speech disturbances: Principal | ICD-10-CM | POA: Diagnosis present

## 2023-10-14 DIAGNOSIS — Z87891 Personal history of nicotine dependence: Secondary | ICD-10-CM

## 2023-10-14 DIAGNOSIS — E213 Hyperparathyroidism, unspecified: Secondary | ICD-10-CM | POA: Diagnosis present

## 2023-10-14 DIAGNOSIS — E8729 Other acidosis: Secondary | ICD-10-CM | POA: Diagnosis not present

## 2023-10-14 DIAGNOSIS — I6782 Cerebral ischemia: Secondary | ICD-10-CM | POA: Diagnosis not present

## 2023-10-14 DIAGNOSIS — Z820 Family history of epilepsy and other diseases of the nervous system: Secondary | ICD-10-CM | POA: Diagnosis not present

## 2023-10-14 DIAGNOSIS — Z6830 Body mass index (BMI) 30.0-30.9, adult: Secondary | ICD-10-CM | POA: Diagnosis not present

## 2023-10-14 DIAGNOSIS — R531 Weakness: Secondary | ICD-10-CM | POA: Diagnosis present

## 2023-10-14 DIAGNOSIS — S2232XA Fracture of one rib, left side, initial encounter for closed fracture: Secondary | ICD-10-CM | POA: Diagnosis not present

## 2023-10-14 DIAGNOSIS — G9341 Metabolic encephalopathy: Secondary | ICD-10-CM | POA: Diagnosis not present

## 2023-10-14 DIAGNOSIS — Z8249 Family history of ischemic heart disease and other diseases of the circulatory system: Secondary | ICD-10-CM

## 2023-10-14 DIAGNOSIS — R4182 Altered mental status, unspecified: Secondary | ICD-10-CM | POA: Diagnosis present

## 2023-10-14 DIAGNOSIS — Z7401 Bed confinement status: Secondary | ICD-10-CM | POA: Diagnosis not present

## 2023-10-14 DIAGNOSIS — I491 Atrial premature depolarization: Secondary | ICD-10-CM | POA: Diagnosis not present

## 2023-10-14 DIAGNOSIS — M25552 Pain in left hip: Secondary | ICD-10-CM | POA: Diagnosis not present

## 2023-10-14 DIAGNOSIS — E8809 Other disorders of plasma-protein metabolism, not elsewhere classified: Secondary | ICD-10-CM | POA: Diagnosis present

## 2023-10-14 DIAGNOSIS — E785 Hyperlipidemia, unspecified: Secondary | ICD-10-CM | POA: Diagnosis present

## 2023-10-14 DIAGNOSIS — G4700 Insomnia, unspecified: Secondary | ICD-10-CM | POA: Diagnosis not present

## 2023-10-14 DIAGNOSIS — R569 Unspecified convulsions: Secondary | ICD-10-CM | POA: Diagnosis not present

## 2023-10-14 DIAGNOSIS — I959 Hypotension, unspecified: Secondary | ICD-10-CM | POA: Diagnosis not present

## 2023-10-14 DIAGNOSIS — R1312 Dysphagia, oropharyngeal phase: Secondary | ICD-10-CM | POA: Diagnosis present

## 2023-10-14 DIAGNOSIS — R29818 Other symptoms and signs involving the nervous system: Secondary | ICD-10-CM | POA: Diagnosis not present

## 2023-10-14 DIAGNOSIS — E43 Unspecified severe protein-calorie malnutrition: Secondary | ICD-10-CM | POA: Diagnosis present

## 2023-10-14 DIAGNOSIS — D649 Anemia, unspecified: Secondary | ICD-10-CM | POA: Diagnosis present

## 2023-10-14 DIAGNOSIS — B9689 Other specified bacterial agents as the cause of diseases classified elsewhere: Secondary | ICD-10-CM | POA: Diagnosis present

## 2023-10-14 DIAGNOSIS — Z96652 Presence of left artificial knee joint: Secondary | ICD-10-CM | POA: Diagnosis present

## 2023-10-14 DIAGNOSIS — M1990 Unspecified osteoarthritis, unspecified site: Secondary | ICD-10-CM | POA: Diagnosis present

## 2023-10-14 DIAGNOSIS — K5900 Constipation, unspecified: Secondary | ICD-10-CM | POA: Diagnosis not present

## 2023-10-14 DIAGNOSIS — Z9181 History of falling: Secondary | ICD-10-CM | POA: Diagnosis not present

## 2023-10-14 LAB — COMPREHENSIVE METABOLIC PANEL WITH GFR
ALT: 16 U/L (ref 0–44)
AST: 19 U/L (ref 15–41)
Albumin: 3 g/dL — ABNORMAL LOW (ref 3.5–5.0)
Alkaline Phosphatase: 55 U/L (ref 38–126)
Anion gap: 8 (ref 5–15)
BUN: 12 mg/dL (ref 8–23)
CO2: 24 mmol/L (ref 22–32)
Calcium: 11.7 mg/dL — ABNORMAL HIGH (ref 8.9–10.3)
Chloride: 106 mmol/L (ref 98–111)
Creatinine, Ser: 1.12 mg/dL — ABNORMAL HIGH (ref 0.44–1.00)
GFR, Estimated: 47 mL/min — ABNORMAL LOW (ref >60)
Glucose, Bld: 103 mg/dL — ABNORMAL HIGH (ref 70–99)
Potassium: 4.1 mmol/L (ref 3.5–5.1)
Sodium: 138 mmol/L (ref 135–145)
Total Bilirubin: 0.3 mg/dL (ref 0.0–1.2)
Total Protein: 6.2 g/dL — ABNORMAL LOW (ref 6.5–8.1)

## 2023-10-14 LAB — CBC
HCT: 32.5 % — ABNORMAL LOW (ref 36.0–46.0)
Hemoglobin: 10.6 g/dL — ABNORMAL LOW (ref 12.0–15.0)
MCH: 33.7 pg (ref 26.0–34.0)
MCHC: 32.6 g/dL (ref 30.0–36.0)
MCV: 103.2 fL — ABNORMAL HIGH (ref 80.0–100.0)
Platelets: 254 10*3/uL (ref 150–400)
RBC: 3.15 MIL/uL — ABNORMAL LOW (ref 3.87–5.11)
RDW: 13.4 % (ref 11.5–15.5)
WBC: 11.8 10*3/uL — ABNORMAL HIGH (ref 4.0–10.5)
nRBC: 0 % (ref 0.0–0.2)

## 2023-10-14 LAB — APTT: aPTT: 26 s (ref 24–36)

## 2023-10-14 LAB — I-STAT CHEM 8, ED
BUN: 17 mg/dL (ref 8–23)
Calcium, Ion: 1.43 mmol/L — ABNORMAL HIGH (ref 1.15–1.40)
Chloride: 107 mmol/L (ref 98–111)
Creatinine, Ser: 1.2 mg/dL — ABNORMAL HIGH (ref 0.44–1.00)
Glucose, Bld: 106 mg/dL — ABNORMAL HIGH (ref 70–99)
HCT: 31 % — ABNORMAL LOW (ref 36.0–46.0)
Hemoglobin: 10.5 g/dL — ABNORMAL LOW (ref 12.0–15.0)
Potassium: 5.6 mmol/L — ABNORMAL HIGH (ref 3.5–5.1)
Sodium: 137 mmol/L (ref 135–145)
TCO2: 25 mmol/L (ref 22–32)

## 2023-10-14 LAB — AMMONIA: Ammonia: 53 umol/L — ABNORMAL HIGH (ref 9–35)

## 2023-10-14 LAB — URINALYSIS, W/ REFLEX TO CULTURE (INFECTION SUSPECTED)
Bilirubin Urine: NEGATIVE
Glucose, UA: NEGATIVE mg/dL
Hgb urine dipstick: NEGATIVE
Ketones, ur: NEGATIVE mg/dL
Nitrite: NEGATIVE
Protein, ur: NEGATIVE mg/dL
Specific Gravity, Urine: 1.016 (ref 1.005–1.030)
pH: 8 (ref 5.0–8.0)

## 2023-10-14 LAB — PROTIME-INR
INR: 1.1 (ref 0.8–1.2)
Prothrombin Time: 14.1 s (ref 11.4–15.2)

## 2023-10-14 LAB — DIFFERENTIAL
Abs Immature Granulocytes: 0.1 10*3/uL — ABNORMAL HIGH (ref 0.00–0.07)
Basophils Absolute: 0.1 10*3/uL (ref 0.0–0.1)
Basophils Relative: 1 %
Eosinophils Absolute: 0.5 10*3/uL (ref 0.0–0.5)
Eosinophils Relative: 4 %
Immature Granulocytes: 1 %
Lymphocytes Relative: 17 %
Lymphs Abs: 2 10*3/uL (ref 0.7–4.0)
Monocytes Absolute: 1.3 10*3/uL — ABNORMAL HIGH (ref 0.1–1.0)
Monocytes Relative: 11 %
Neutro Abs: 7.9 10*3/uL — ABNORMAL HIGH (ref 1.7–7.7)
Neutrophils Relative %: 66 %

## 2023-10-14 LAB — ETHANOL: Alcohol, Ethyl (B): <15 mg/dL (ref <15)

## 2023-10-14 MED ORDER — SENNOSIDES-DOCUSATE SODIUM 8.6-50 MG PO TABS: 1.0000 | ORAL_TABLET | Freq: Every evening | ORAL | Status: DC | PRN

## 2023-10-14 MED ORDER — IOHEXOL 350 MG/ML SOLN
75.0000 mL | Freq: Once | INTRAVENOUS | Status: AC | PRN
  Administered 2023-10-14: 75 mL via INTRAVENOUS

## 2023-10-14 MED ORDER — ACETAMINOPHEN 325 MG PO TABS
650.0000 mg | ORAL_TABLET | ORAL | Status: DC | PRN
  Administered 2023-10-16 – 2023-10-19 (×3): 650 mg via ORAL
  Filled 2023-10-14 (×3): qty 2

## 2023-10-14 MED ORDER — SIMVASTATIN 20 MG PO TABS
40.0000 mg | ORAL_TABLET | Freq: Every day | ORAL | Status: DC
  Administered 2023-10-15 – 2023-10-18 (×4): 40 mg via ORAL
  Filled 2023-10-14 (×4): qty 2

## 2023-10-14 MED ORDER — SODIUM CHLORIDE 0.9% FLUSH
3.0000 mL | Freq: Once | INTRAVENOUS | Status: AC
  Administered 2023-10-14: 3 mL via INTRAVENOUS

## 2023-10-14 MED ORDER — MELATONIN 3 MG PO TABS
6.0000 mg | ORAL_TABLET | Freq: Every day | ORAL | Status: DC
  Administered 2023-10-15 – 2023-10-18 (×5): 6 mg via ORAL
  Filled 2023-10-14 (×5): qty 2

## 2023-10-14 MED ORDER — LEVOTHYROXINE SODIUM 100 MCG PO TABS
100.0000 ug | ORAL_TABLET | Freq: Every morning | ORAL | Status: DC
  Administered 2023-10-16 – 2023-10-19 (×4): 100 ug via ORAL
  Filled 2023-10-14 (×5): qty 1

## 2023-10-14 MED ORDER — THIAMINE MONONITRATE 100 MG PO TABS
100.0000 mg | ORAL_TABLET | Freq: Every day | ORAL | Status: DC
  Administered 2023-10-15 – 2023-10-19 (×5): 100 mg via ORAL
  Filled 2023-10-14 (×5): qty 1

## 2023-10-14 MED ORDER — STROKE: EARLY STAGES OF RECOVERY BOOK: Freq: Once | Status: DC

## 2023-10-14 MED ORDER — ACETAMINOPHEN 650 MG RE SUPP: 650.0000 mg | RECTAL | Status: DC | PRN

## 2023-10-14 MED ORDER — ENOXAPARIN SODIUM 40 MG/0.4ML IJ SOSY
40.0000 mg | PREFILLED_SYRINGE | INTRAMUSCULAR | Status: DC
  Administered 2023-10-14 – 2023-10-18 (×5): 40 mg via SUBCUTANEOUS
  Filled 2023-10-14 (×5): qty 0.4

## 2023-10-14 MED ORDER — SODIUM CHLORIDE 0.9 % IV SOLN: INTRAVENOUS | Status: DC

## 2023-10-14 MED ORDER — ACETAMINOPHEN 160 MG/5ML PO SOLN: 650.0000 mg | ORAL | Status: DC | PRN

## 2023-10-14 NOTE — ED Notes (Signed)
 CCMD called to add pt to cardiac monitor

## 2023-10-14 NOTE — H&P (Signed)
 History and Physical    Kimberly Webb ZOX:[id number] DOB: 1933-06-03 DOA: 10/14/2023  PCP: [name], MD  Patient coming from: SNF  I have personally briefly reviewed patient's old medical records in [facility]  Chief Complaint: Speech abnormality  HPI: Kimberly Webb is a 89 y.o. female with medical history significant for HTN, HLD, hypothyroidism who presented to the ED from SNF for evaluation of speech abnormality.  Patient was recently admitted 4/30-5/6 for generalized weakness and failure to thrive after a fall at home.  Imaging showed left 3rd-7th as well as left lateral ninth rib fractures.  She was noted to have acute metabolic encephalopathy secondary to UTI, hypercalcemia, and AKI.  Urine culture grew E. coli, Proteus, and Aerococcus species.  She was treated with ceftriaxone  and transition to oral cefuroxime .  She was discharged to SNF.  Patient was last known well around 1 PM.  Afterwards staff at her facility noticed that she had difficulty speaking with apparent aphasia and occasional slurred speech.  This resolved after a short period of time but then recurred again en route to the ED with EMS.  Symptoms again resolved by the time she got her CT scan but then developed difficulty with speech again afterwards.  This resolved again after a few minutes.  At time of admission patient has clear fluent speech.  She does acknowledge difficulty in seeing what she wants to say and some slurring of her speech earlier.  She denies any new weakness in her extremities.  She denies nausea, vomiting, abdominal pain, dysuria, chest pain, dyspnea.  She says she has limited mobility and usually uses a wheelchair at baseline.  ED Course  Labs/Imaging on admission: I have personally reviewed following labs and imaging studies.  Initial vitals showed BP 164/82, pulse 69, RR 16, temp 99.6 F, SpO2 98% on room air.  Labs showed WBC 11.8, hemoglobin 10.6, platelets 254, serum ethanol <15,  ammonia 53,**  CT head without contrast negative for acute intracranial normality.  Parenchymal atrophy, chronic small vessel ischemic disease and chronic lacunar infarcts noted.  CTA head/neck showed patent carotid and vertebral arteries.  Nonstenotic atherosclerotic plaque at the right vertebral artery origin noted.  No proximal intracranial LVO or high-grade proximal arterial stenosis identified.  Atherosclerotic plaque within the intracranial internal carotid arteries with no more than mild stenosis.  Portable chest x-ray negative for focal consolidation, edema, effusion.  Subacute mildly displaced left ninth rib fracture again noted.  Left and right hip x-rays negative for acute fracture.  EDP spoke with on-call neurology who have seen patient in consultation and recommended medical admission for further CVA versus metabolic versus seizure workup.  The hospitalist service was consulted to admit.  Review of Systems: All systems reviewed and are negative except as documented in history of present illness above.   Past Medical History:  Diagnosis Date   Arthritis    Atrophic vaginitis    Broken collarbone    Broken neck (HCC)    Broken ribs    Cystocele    Elevated serum creatinine    decreased GFR   Fibroid    Hypercholesteremia    Hypertension    Hypothyroidism    Osteoporosis    Paratubal cyst    Rectocele    STD (sexually transmitted disease)    HSV   Uterine prolapse     Past Surgical History:  Procedure Laterality Date   BROKEN ANKLE     BROKEN FEMUR     CATARACT  EXTRACTION Bilateral    FOOT SURGERY Right    TOE   HERNIA REPAIR  2009   OOPHORECTOMY     BSO WITH VAG HYST IN 2004   REPLACEMENT TOTAL KNEE  2011   LEFT X 2   THYROIDECTOMY, PARTIAL     VAGINAL HYSTERECTOMY  2004   VAG HYST, BSO, A&P REPAIR    Social History: Social History   Tobacco Use   Smoking status: Former   Smokeless tobacco: Never  Advertising account planner   Vaping status: Never Used   Substance Use Topics   Alcohol use: No   Drug use: No   No Known Allergies  Family History  Problem Relation Age of Onset   Heart disease Mother    Hypertension Father    Hypertension Sister    Leukemia Sister    Heart disease Brother    Multiple sclerosis Brother    Parkinsonism Brother      Prior to Admission medications   Medication Sig Start Date End Date Taking? Authorizing Provider  acetaminophen  (TYLENOL ) 650 MG CR tablet Take 650 mg by mouth every 8 (eight) hours as needed for pain.   Yes [provider]  acyclovir  (ZOVIRAX ) 400 MG tablet Take 400 mg by mouth daily.   Yes [provider]  amLODipine  (NORVASC ) 5 MG tablet Take 5 mg by mouth daily.   Yes [provider]  Glucosamine HCl (GLUCOSAMINE PO) Take 1 tablet by mouth daily.   Yes [provider]  levothyroxine  (SYNTHROID ) 100 MCG tablet Take 100 mcg by mouth every morning. 07/21/23  Yes [provider]  lidocaine  (LIDODERM ) 5 % Place 1 patch onto the skin at bedtime. Remove & Discard patch within 12 hours or as directed by MD 10/08/23  Yes Sheikh, Omair Latif, DO  lisinopril  (ZESTRIL ) 10 MG tablet Take 10 mg by mouth daily. 05/08/19  Yes [provider]  melatonin 3 MG TABS tablet Take 6 mg by mouth at bedtime.   Yes [provider]  methocarbamol  (ROBAXIN ) 500 MG tablet Take 1 tablet (500 mg total) by mouth every 8 (eight) hours as needed for muscle spasms (pain). 10/08/23  Yes Sheikh, Omair Latif, DO  Multiple Vitamin (MULTIVITAMIN) capsule Take 1 capsule by mouth daily.   Yes [provider]  niacin  500 MG tablet Take 500 mg by mouth at bedtime.   Yes [provider]  nystatin  (MYCOSTATIN ) 100000 UNIT/ML suspension Take 4 mLs by mouth 4 (four) times daily.   Yes [provider]  ondansetron  (ZOFRAN ) 4 MG tablet Take 1 tablet (4 mg total) by mouth every 6 (six) hours as needed for nausea. 10/08/23  Yes Sheikh, Omair Latif, DO  senna  (SENOKOT) 8.6 MG TABS tablet Take 1 tablet by mouth at bedtime as needed for mild constipation.   Yes [provider]  simvastatin  (ZOCOR ) 40 MG tablet Take 40 mg by mouth at bedtime.   Yes [provider]  thiamine  (VITAMIN B-1) 100 MG tablet Take 1 tablet (100 mg total) by mouth daily. 10/09/23  Yes Sheikh, Omair Latif, DO  Zinc  Oxide 25 % PSTE Apply 1 application  topically 2 (two) times daily. Apply to sacrum/peri area topically two times a day.   Yes [provider]  denosumab  (PROLIA ) 60 MG/ML SOLN injection Inject 60 mg into the skin every 6 (six) months. Administer in upper arm, thigh, or abdomen    [provider]  feeding supplement (ENSURE ENLIVE / ENSURE PLUS) LIQD Take 237  mLs by mouth 2 (two) times daily between meals. Patient not taking: Reported on 10/14/2023 10/08/23   Aura Leeds Latif, DO  liver oil-zinc  oxide (DESITIN) 40 % ointment Apply topically 2 (two) times daily. Patient not taking: Reported on 10/14/2023 10/08/23   Sheikh, Omair Latif, DO  Omega-3 Fatty Acids (FISH OIL PO) Take 1 capsule by mouth daily. Patient not taking: Reported on 10/14/2023    [provider]    Physical Exam: Vitals:   10/14/23 1728 10/14/23 1800 10/14/23 1830 10/14/23 2100  BP:  124/79 (!) 120/98 (!) 150/67  Pulse:  67 68 62  Resp:  17 15 16   Temp:    99.1 F (37.3 C)  TempSrc:    Oral  SpO2:  97% 97% 97%  Weight: 75 kg     Height: 5\' 2"  (1.575 m)      Constitutional: Resting supine in bed, NAD, calm, comfortable Eyes: EOMI, lids and conjunctivae normal ENMT: Mucous membranes are moist. Posterior pharynx clear of any exudate or lesions.Normal dentition.  Neck: normal, supple, no masses. Respiratory: clear to auscultation bilaterally, no wheezing, no crackles. Normal respiratory effort. No accessory muscle use.  Cardiovascular: Regular rate and rhythm, no murmurs / rubs / gallops. No extremity edema. 2+ pedal pulses. Abdomen: no tenderness, no  masses palpated. Musculoskeletal: no clubbing / cyanosis. No joint deformity upper and lower extremities. Good ROM, no contractures. Normal muscle tone.  Skin: no rashes, lesions, ulcers. No induration Neurologic: Sensation intact. Strength 5/5 in all 4.  Psychiatric:  Alert and oriented x 3.  Flat affect.  EKG: Personally reviewed. Sinus rhythm, rate 68, PVC, low voltage.  Assessment/Plan Principal Problem:   Speech abnormality Active Problems:   Hypercholesteremia   Hypothyroidism   Essential hypertension   Kimberly Webb is a 89 y.o. female with medical history significant for HTN, HLD, hypothyroidism who is admitted for evaluation of speech abnormality.  Assessment and Plan: Intermittent speech abnormality/aphasia: New waxing/waning expressive aphasia.  Differential includes CVA versus metabolic encephalopathy/hypercalcemia versus seizure.  Neurology following and recommending further workup as below: Keep on telemetry, continue neurochecks - MRI brain without contrast - Routine EEG - UA negative for persistent/recurrent UTI - CXR negative for active disease - Seizure precautions - PT/OT/SLP eval - Start IV normal saline for hypercalcemia  Hypercalcemia: Recent admission for same with persistent hypercalcemia, corrected calcium is 12.5 this admission.  Potentially contributing to presentation.  PTH was 33 on 5/6.  PTHrP in process. - Start IV normal saline hydration overnight - Check vitamin D  levels  Hypertension: Holding amlodipine  and lisinopril  for now pending MRI brain.  Hyperlipidemia: Continue simvastatin .  Hypothyroidism: Continue Synthroid .   DVT prophylaxis: enoxaparin  (LOVENOX ) injection 40 mg Start: 10/14/23 2130 Code Status: Full code, confirmed with patient on admission Family Communication: Daughter at bedside. Disposition Plan: From SNF, dispo pending clinical progress Consults called: Neurology Severity of Illness: The appropriate patient status  for this patient is OBSERVATION. Observation status is judged to be reasonable and necessary in order to provide the required intensity of service to ensure the patient's safety. The patient's presenting symptoms, physical exam findings, and initial radiographic and laboratory data in the context of their medical condition is felt to place them at decreased risk for further clinical deterioration. Furthermore, it is anticipated that the patient will be medically stable for discharge from the hospital within 2 midnights of admission.   Edith Gores MD [facility]  If 7PM-7AM, please contact night-coverage [email or web address]  10/15/2023, 12:03 AM

## 2023-10-14 NOTE — Progress Notes (Signed)
EEG complete. Results pending.  ?

## 2023-10-14 NOTE — Code Documentation (Signed)
 Stroke Response Nurse Documentation Code Documentation  Kimberly Webb is a 89 y.o. female arriving to [facility]  via [facility] on 5/12 with past medical hx of AKI, hypercalcemia, FTT. On No antithrombotic. Code stroke was activated by EMS.   Patient from [facility] where she was LKW at 1330 and staff called 911 due to patient having expressive aphasia.    Stroke team at the bedside on patient arrival. Labs drawn and patient cleared for CT by Dr. Lewis Red. Patient to CT with team. NIHSS 3, see documentation for details and code stroke times. Patient with right leg weakness and Expressive aphasia  on exam. The following imaging was completed:  CT Head and CTA. Patient is not a candidate for IV Thrombolytic due to stroke not suspected, symptoms mild and improving. Patient is not a candidate for IR due to no LVO.   Care Plan: q30 NIHSS and vitals until 1800 then q2.   Bedside handoff with ED RN Rebeckah.    Marlon Simpson  Stroke Response RN

## 2023-10-14 NOTE — ED Provider Notes (Signed)
 [place] EMERGENCY DEPARTMENT AT [facility] Provider Note   CSN: [id number] Arrival date & time: 10/14/23  1652     History    Kimberly Webb is a 89 y.o. female.  HPI   Patient presented to the ED for evaluation of weakness possible speech difficulties.  Initially activated as a code stroke.  Patient has a history of osteoporosis, arthritis, STI, cervical fracture, AKI, weakness.  Patient was also recently admitted to the hospital on April 30.  She was discharged on May 6.  At that time patient was admitted for acute kidney injury, failure to thrive, hypercalcemia, weakness, multiple rib fractures, malnutrition.  The patient was discharged to a nursing facility.  Patient reportedly had difficulty with her speech and weakness today.  The facility called EMS as a possible stroke.  Patient was seen by stroke team on arrival.  Patient herself denies any complaints right now.  She is not sure who called the ambulance.  Home Medications Prior to Admission medications   Medication Sig Start Date End Date Taking? Authorizing Provider  acetaminophen  (TYLENOL ) 650 MG CR tablet Take 650 mg by mouth every 8 (eight) hours as needed for pain.   Yes [provider]  acyclovir  (ZOVIRAX ) 400 MG tablet Take 400 mg by mouth daily.   Yes [provider]  amLODipine  (NORVASC ) 5 MG tablet Take 5 mg by mouth daily.   Yes [provider]  Glucosamine HCl (GLUCOSAMINE PO) Take 1 tablet by mouth daily.   Yes [provider]  levothyroxine  (SYNTHROID ) 100 MCG tablet Take 100 mcg by mouth every morning. 07/21/23  Yes [provider]  lidocaine  (LIDODERM ) 5 % Place 1 patch onto the skin at bedtime. Remove & Discard patch within 12 hours or as directed by MD 10/08/23  Yes Sheikh, Omair Latif, DO  lisinopril  (ZESTRIL ) 10 MG tablet Take 10 mg by mouth daily. 05/08/19  Yes [provider]  melatonin 3 MG TABS tablet Take 6 mg by mouth at bedtime.   Yes  [provider]  methocarbamol  (ROBAXIN ) 500 MG tablet Take 1 tablet (500 mg total) by mouth every 8 (eight) hours as needed for muscle spasms (pain). 10/08/23  Yes Sheikh, Omair Latif, DO  Multiple Vitamin (MULTIVITAMIN) capsule Take 1 capsule by mouth daily.   Yes [provider]  niacin  500 MG tablet Take 500 mg by mouth at bedtime.   Yes [provider]  nystatin  (MYCOSTATIN ) 100000 UNIT/ML suspension Take 4 mLs by mouth 4 (four) times daily.   Yes [provider]  ondansetron  (ZOFRAN ) 4 MG tablet Take 1 tablet (4 mg total) by mouth every 6 (six) hours as needed for nausea. 10/08/23  Yes Sheikh, Omair Latif, DO  senna (SENOKOT) 8.6 MG TABS tablet Take 1 tablet by mouth at bedtime as needed for mild constipation.   Yes [provider]  simvastatin  (ZOCOR ) 40 MG tablet Take 40 mg by mouth at bedtime.   Yes [provider]  thiamine  (VITAMIN B-1) 100 MG tablet Take 1 tablet (100 mg total) by mouth daily. 10/09/23  Yes Sheikh, Omair Latif, DO  Zinc  Oxide 25 % PSTE Apply 1 application  topically 2 (two) times daily. Apply to sacrum/peri area topically two times a day.   Yes [provider]  denosumab  (PROLIA ) 60 MG/ML SOLN injection Inject 60 mg into the skin every 6 (six) months. Administer in upper arm, thigh, or abdomen    [provider]  feeding supplement (ENSURE  ENLIVE / ENSURE PLUS) LIQD Take 237 mLs by mouth 2 (two) times daily between meals. Patient not taking: Reported on 10/14/2023 10/08/23   Aura Leeds Latif, DO  liver oil-zinc  oxide (DESITIN) 40 % ointment Apply topically 2 (two) times daily. Patient not taking: Reported on 10/14/2023 10/08/23   Sheikh, Omair Latif, DO  Omega-3 Fatty Acids (FISH OIL PO) Take 1 capsule by mouth daily. Patient not taking: Reported on 10/14/2023    [provider]      Allergies    Patient has no known allergies.    Review of Systems   Review of Systems  Physical Exam Updated  Vital Signs BP (!) 164/82   Pulse 69   Temp 99.6 F (37.6 C) (Oral)   Resp 16   Ht 1.575 m (5\' 2" )   Wt 75 kg   SpO2 98%   BMI 30.24 kg/m  Physical Exam Vitals and nursing note reviewed.  Constitutional:      Appearance: She is well-developed. She is not diaphoretic.  HENT:     Head: Normocephalic and atraumatic.     Right Ear: External ear normal.     Left Ear: External ear normal.  Eyes:     General: No scleral icterus.       Right eye: No discharge.        Left eye: No discharge.     Conjunctiva/sclera: Conjunctivae normal.  Neck:     Trachea: No tracheal deviation.  Cardiovascular:     Rate and Rhythm: Normal rate and regular rhythm.  Pulmonary:     Effort: Pulmonary effort is normal. No respiratory distress.     Breath sounds: Normal breath sounds. No stridor. No wheezing or rales.  Abdominal:     General: Bowel sounds are normal. There is no distension.     Palpations: Abdomen is soft.     Tenderness: There is no abdominal tenderness. There is no guarding or rebound.  Musculoskeletal:        General: No tenderness or deformity.     Cervical back: Neck supple.     Comments: Pain with lifting right leg  Skin:    General: Skin is warm and dry.     Findings: No rash.  Neurological:     General: No focal deficit present.     Mental Status: She is alert.     Cranial Nerves: No cranial nerve deficit, dysarthria or facial asymmetry.     Sensory: No sensory deficit.     Motor: Weakness present. No abnormal muscle tone or seizure activity.     Coordination: Coordination normal.     Comments: Generalized weakness, patient able to move her hands and legs, no facial droop, patient is clear but at times patient with word finding difficulty/neologism.  For example, Pt asked "Where is my dozhen"  When asked to clarify then said "daughter"  Psychiatric:        Mood and Affect: Mood normal.     ED Results / Procedures / Treatments   Labs (all labs ordered are listed, but  only abnormal results are displayed) Labs Reviewed  CBC - Abnormal; Notable for the following components:      Result Value   WBC 11.8 (*)    RBC 3.15 (*)    Hemoglobin 10.6 (*)    HCT 32.5 (*)    MCV 103.2 (*)    All other components within normal limits  DIFFERENTIAL - Abnormal; Notable for the following components:   Neutro  Abs 7.9 (*)    Monocytes Absolute 1.3 (*)    Abs Immature Granulocytes 0.10 (*)    All other components within normal limits  I-STAT CHEM 8, ED - Abnormal; Notable for the following components:   Potassium 5.6 (*)    Creatinine, Ser 1.20 (*)    Glucose, Bld 106 (*)    Calcium, Ion 1.43 (*)    Hemoglobin 10.5 (*)    HCT 31.0 (*)    All other components within normal limits  ETHANOL  PROTIME-INR  APTT  URINALYSIS, W/ REFLEX TO CULTURE (INFECTION SUSPECTED)  AMMONIA  COMPREHENSIVE METABOLIC PANEL WITH GFR  CBG MONITORING, ED    EKG EKG Interpretation Date/Time:  Monday Oct 14 2023 17:19:50 EDT Ventricular Rate:  68 PR Interval:    QRS Duration:  99 QT Interval:  386 QTC Calculation: 411 R Axis:   6  Text Interpretation: Sinus rhythm Ventricular premature complex Low voltage, precordial leads Abnormal R-wave progression, early transition No significant change since last tracing Confirmed by Trish Furl [phone]) on 10/14/2023 6:03:12 PM  Radiology CT ANGIO HEAD NECK W WO CM (CODE STROKE) Result Date: 10/14/2023 CLINICAL DATA:  Provided history: Stroke, follow-up. EXAM: CT ANGIOGRAPHY HEAD AND NECK WITH AND WITHOUT CONTRAST TECHNIQUE: Multidetector CT imaging of the head and neck was performed using the standard protocol during bolus administration of intravenous contrast. Multiplanar CT image reconstructions and MIPs were obtained to evaluate the vascular anatomy. Carotid stenosis measurements (when applicable) are obtained utilizing NASCET criteria, using the distal internal carotid diameter as the denominator. RADIATION DOSE REDUCTION: This exam was  performed according to the departmental dose-optimization program which includes automated exposure control, adjustment of the mA and/or kV according to patient size and/or use of iterative reconstruction technique. CONTRAST:  75mL OMNIPAQUE IOHEXOL 350 MG/ML SOLN COMPARISON:  Noncontrast head CT performed earlier today 10/14/2023. Cervical spine CT 10/02/2023. FINDINGS: CTA NECK FINDINGS Aortic arch: The left vertebral artery arises directly from the aortic arch. Atherosclerotic plaque within the visualized thoracic aorta and proximal major branch vessels of the neck. No hemodynamically significant innominate or proximal subclavian artery stenosis. Right carotid system: CCA and ICA patent within the neck without stenosis. Minimal atherosclerotic plaque within the proximal ICA. Partially retropharyngeal course of the cervical ICA. Left carotid system: CCA and ICA patent within the neck without stenosis. Minimal atherosclerotic plaque scattered within the CCA. Vertebral arteries: The left vertebral artery arises directly from the aortic arch. Venous reflux of contrast partially obscures the left vertebral artery V1 segment. Within this limitation, the vertebral arteries are patent within the neck without stenosis. Nonstenotic atherosclerotic plaque at the right vertebral artery origin. Skeleton: Known chronic minimally displaced non-healed fracture of the C1 posterior arch on the right. Known chronic, healed oblique fracture of the C2 body on the left. Spondylosis at the cervical and visualized upper thoracic levels. No acute fracture or aggressive osseous lesion. Other neck: Enlarged and heterogeneous left thyroid  lobe, extending into the mediastinum. Upper chest: No consolidation within the imaged lung apices. Review of the MIP images confirms the above findings CTA HEAD FINDINGS Anterior circulation: The intracranial internal carotid arteries are patent. Atherosclerotic plaque within both vessels with no more than  mild stenosis. The M1 middle cerebral arteries are patent. No M2 proximal branch occlusion or high-grade proximal stenosis. The anterior cerebral arteries are patent. No intracranial aneurysm is identified. Posterior circulation: The intracranial vertebral arteries are patent. The basilar artery is patent. The posterior cerebral arteries are patent. The right PCA  is fetal in origin. A small left posterior communicating artery is present. Venous sinuses: Within the limitations of contrast timing, no convincing thrombus. Anatomic variants: As described. Review of the MIP images confirms the above findings No emergent large vessel occlusion identified. These results were communicated to Dr. Bonnita Buttner at 5:36 pmon 5/12/2025by text page via the [facility] messaging system. IMPRESSION: CTA neck: 1. The common carotid and internal carotid arteries are patent within the neck without stenosis. Mild atherosclerotic plaque bilaterally, as described. 2. Venous reflux of contrast partially obscures the left vertebral artery V1 segment. Within this limitation, the vertebral arteries are patent within the neck without stenosis. Non-stenotic atherosclerotic plaque at the right vertebral artery origin. 3. Aortic Atherosclerosis (ICD10-I70.0). 4. Known chronic C1 and C2 vertebral fractures, as described. CTA head: 1. No proximal intracranial large vessel occlusion or high-grade proximal arterial stenosis identified. 2. Atherosclerotic plaque within the intracranial internal carotid arteries with no more than mild stenosis. Electronically Signed   By: Bascom Lily D.O.   On: 10/14/2023 17:36   CT HEAD CODE STROKE WO CONTRAST Result Date: 10/14/2023 CLINICAL DATA:  Code stroke. Neuro deficit, acute, stroke suspected. EXAM: CT HEAD WITHOUT CONTRAST TECHNIQUE: Contiguous axial images were obtained from the base of the skull through the vertex without intravenous contrast. RADIATION DOSE REDUCTION: This exam was performed according to the  departmental dose-optimization program which includes automated exposure control, adjustment of the mA and/or kV according to patient size and/or use of iterative reconstruction technique. COMPARISON:  Head CT 10/02/2023. FINDINGS: Brain: Generalized cerebral atrophy. Chronic lacunar infarcts within/about the bilateral basal ganglia and within the thalami, not appreciably changed from the prior head CT of 10/02/2023. Background moderate patchy and ill-defined hypoattenuation within the cerebral white matter, nonspecific but compatible with chronic small vessel ischemic disease. There is no acute intracranial hemorrhage. No demarcated cortical infarct. No extra-axial fluid collection. No evidence of an intracranial mass. No midline shift. Vascular: No hyperdense vessel.  Atherosclerotic calcifications. Skull: No calvarial fracture or aggressive osseous lesion. Sinuses/Orbits: No mass or acute finding within the imaged orbits. No significant paranasal sinus disease at the imaged levels. ASPECTS [facility] Stroke Program Early CT Score) - Ganglionic level infarction (caudate, lentiform nuclei, internal capsule, insula, M1-M3 cortex): 7 - Supraganglionic infarction (M4-M6 cortex): 3 Total score (0-10 with 10 being normal): 10 (when discounting chronic infarcts). No evidence of an acute intracranial abnormality. These results were communicated to Dr. Bonnita Buttner at 5:18 pmon 5/12/2025by text page via the [facility] messaging system. IMPRESSION: 1. No evidence of an acute intracranial abnormality. 2. Parenchymal atrophy, chronic small vessel ischemic disease and chronic lacunar infarcts, as described. Electronically Signed   By: Bascom Lily D.O.   On: 10/14/2023 17:19    Procedures Procedures    Medications Ordered in ED Medications  sodium chloride  flush (NS) 0.9 % injection 3 mL (3 mLs Intravenous Given 10/14/23 1740)  iohexol (OMNIPAQUE) 350 MG/ML injection 75 mL (75 mLs Intravenous Contrast Given 10/14/23 1707)    ED  Course/ Medical Decision Making/ A&P Clinical Course as of 10/14/23 1906  Mon Oct 14, 2023  1822 CBC shows white count increased at 11.8.  Hemoglobin decreased at 10.6.,  Similar to previous values.  Electrolyte panel does show calcium increased [JK]  1822 Patient's CT scan does not show any large vessel occlusion. [JK]  1823 Case discussed with Dr. Arora.  Will plan on admission to the hospital for further evaluation.  Patient is not a thrombolytic candidate [JK]  1903 Case discussed  with Dr Lydia Sams [JK]    Clinical Course User Index [JK] Trish Furl, MD                                 Medical Decision Making Problems Addressed: Difficulty with speech: acute illness or injury that poses a threat to life or bodily functions Weakness: acute illness or injury that poses a threat to life or bodily functions  Amount and/or Complexity of Data Reviewed Labs: ordered. Decision-making details documented in ED Course. Radiology: ordered and independent interpretation performed.  Risk Decision regarding hospitalization.   Patient presented to the ED for evaluation of acute speech issues.  Patient was seen by stroke team on arrival.  Not felt to be a thrombolytic candidate.  Patient has had some waxing and waning symptoms.  Question if this might be more metabolic in nature.  Patient's ammonia is slightly elevated but diet that is the cause of her symptoms.  She does have increased ionized calcium.  Will correlate with the metabolic panel is currently pending.  Initial head CT and angio did not show any acute abnormalities.  Plan is for admission to the hospital for further stroke evaluation.        Final Clinical Impression(s) / ED Diagnoses Final diagnoses:  Difficulty with speech  Weakness    Rx / DC Orders ED Discharge Orders     None         Trish Furl, MD 10/14/23 1907

## 2023-10-14 NOTE — Hospital Course (Signed)
 Kimberly Webb is a 89 y.o. female with medical history significant for HTN, HLD, hypothyroidism who is admitted for evaluation of speech abnormality.

## 2023-10-14 NOTE — Consult Note (Signed)
 NEUROLOGY CONSULT NOTE   Date of service: Oct 14, 2023 Patient Name: Kimberly Webb MRN:  [id number] DOB:  [birth year] Chief Complaint: "code stroke - speech difficutlies" Requesting Provider: Trish Furl, MD  History of Present Illness  Kimberly Webb is a 89 y.o. female with hx of hypertension, hyperlipidemia, hypothyroidism, osteoporosis, failure to thrive, recent admission for AKI/failure to thrive/hypercalcemia/multiple closed fractures of the ribs/protein calorie malnutrition severe-presented from facility for evaluation of speech difficulties. Last known well 1 PM.  After that facility noted that she was having difficulty talking.  When EMS arrived, on their initial evaluation, symptoms had resolved.  En route to the hospital, speech symptoms recurred.  On ER evaluation initially-she was having some amount of aphasia but that resolved by the time she got a CT scan.  She again started having difficulty with speech while in the emergency department room which then again resolved in a few minutes. She is unable to provide reliable history   LKW: 1 PM per facility handoff Modified rankin score: 4-Needs assistance to walk and tend to bodily needs, maybe around 5 IV Thrombolysis: Fluctuating symptoms with complete resolution EVT: Poor MR S  NIHSS components Score: Comment  1a Level of Conscious 0[x]  1[]  2[]  3[]      1b LOC Questions 0[x]  1[]  2[]       1c LOC Commands 0[x]  1[]  2[]       2 Best Gaze 0[x]  1[]  2[]       3 Visual 0[x]  1[]  2[]  3[]      4 Facial Palsy 0[x]  1[]  2[]  3[]      5a Motor Arm - left 0[x]  1[]  2[]  3[]  4[]  UN[]    5b Motor Arm - Right 0[x]  1[]  2[]  3[]  4[]  UN[]    6a Motor Leg - Left 0[]  1[]  2[x]  3[]  4[]  UN[]    6b Motor Leg - Right 0[]  1[]  2[x]  3[]  4[]  UN[]    7 Limb Ataxia 0[x]  1[]  2[]  UN[]      8 Sensory 0[x]  1[]  2[]  UN[]      9 Best Language 0[x]  1[]  2[]  3[]      10 Dysarthria 0[]  1[x]  2[]  UN[]      11 Extinct. and Inattention 0[x]  1[]  2[]       TOTAL: 5      ROS   comprehensive ROS performed and pertinent positives documented in HPI     Past History   Past Medical History:  Diagnosis Date   Arthritis    Atrophic vaginitis    Broken collarbone    Broken neck (HCC)    Broken ribs    Cystocele    Elevated serum creatinine    decreased GFR   Fibroid    Hypercholesteremia    Hypertension    Hypothyroidism    Osteoporosis    Paratubal cyst    Rectocele    STD (sexually transmitted disease)    HSV   Uterine prolapse     Past Surgical History:  Procedure Laterality Date   BROKEN ANKLE     BROKEN FEMUR     CATARACT EXTRACTION Bilateral    FOOT SURGERY Right    TOE   HERNIA REPAIR  2009   OOPHORECTOMY     BSO WITH VAG HYST IN 2004   REPLACEMENT TOTAL KNEE  2011   LEFT X 2   THYROIDECTOMY, PARTIAL     VAGINAL HYSTERECTOMY  2004   VAG HYST, BSO, A&P REPAIR    Family History: Family History  Problem Relation Age of Onset   Heart disease Mother  Hypertension Father    Hypertension Sister    Leukemia Sister    Heart disease Brother    Multiple sclerosis Brother    Parkinsonism Brother     Social History  reports that she has quit smoking. She has never used smokeless tobacco. She reports that she does not drink alcohol and does not use drugs.  No Known Allergies  Medications   Current Facility-Administered Medications:    sodium chloride  flush (NS) 0.9 % injection 3 mL, 3 mL, Intravenous, Once, Trish Furl, MD  Current Outpatient Medications:    acetaminophen  (TYLENOL ) 650 MG CR tablet, Take 650 mg by mouth every 8 (eight) hours as needed for pain., Disp: , Rfl:    acyclovir  (ZOVIRAX ) 400 MG tablet, Take 400 mg by mouth daily., Disp: , Rfl:    amLODipine  (NORVASC ) 5 MG tablet, Take 5 mg by mouth daily., Disp: , Rfl:    denosumab  (PROLIA ) 60 MG/ML SOLN injection, Inject 60 mg into the skin every 6 (six) months. Administer in upper arm, thigh, or abdomen, Disp: , Rfl:    feeding supplement (ENSURE ENLIVE / ENSURE PLUS)  LIQD, Take 237 mLs by mouth 2 (two) times daily between meals., Disp: , Rfl:    Glucosamine HCl (GLUCOSAMINE PO), Take 1 Dose by mouth daily., Disp: , Rfl:    levothyroxine  (SYNTHROID ) 100 MCG tablet, Take 100 mcg by mouth every morning., Disp: , Rfl:    lidocaine  (LIDODERM ) 5 %, Place 1 patch onto the skin at bedtime. Remove & Discard patch within 12 hours or as directed by MD, Disp: , Rfl:    lisinopril  (ZESTRIL ) 10 MG tablet, Take 10 mg by mouth daily., Disp: , Rfl:    liver oil-zinc  oxide (DESITIN) 40 % ointment, Apply topically 2 (two) times daily., Disp: , Rfl:    methocarbamol  (ROBAXIN ) 500 MG tablet, Take 1 tablet (500 mg total) by mouth every 8 (eight) hours as needed for muscle spasms (pain)., Disp: , Rfl:    Multiple Vitamin (MULTIVITAMIN) capsule, Take 1 capsule by mouth daily., Disp: , Rfl:    niacin  500 MG tablet, Take 500 mg by mouth at bedtime., Disp: , Rfl:    Omega-3 Fatty Acids (FISH OIL PO), Take 1 capsule by mouth daily., Disp: , Rfl:    ondansetron  (ZOFRAN ) 4 MG tablet, Take 1 tablet (4 mg total) by mouth every 6 (six) hours as needed for nausea., Disp: , Rfl:    senna-docusate (SENOKOT-S) 8.6-50 MG tablet, Take 1 tablet by mouth at bedtime as needed for mild constipation., Disp: , Rfl:    simvastatin  (ZOCOR ) 40 MG tablet, Take 40 mg by mouth at bedtime., Disp: , Rfl:    thiamine  (VITAMIN B-1) 100 MG tablet, Take 1 tablet (100 mg total) by mouth daily., Disp: , Rfl:   Vitals   Vitals:   10/14/23 1656 10/14/23 1700 10/14/23 1728  BP: (!) 146/59 (!) 164/82   Pulse: 66 69   Resp: 16 16   Temp:  99.6 F (37.6 C)   TempSrc:  Oral   SpO2: 96% 98%   Weight: 75 kg  75 kg  Height:   5\' 2"  (1.575 m)    Body mass index is 30.24 kg/m.  Physical Exam   General: Awake alert in no distress HEENT: Normocephalic atraumatic Lungs: Clear Cardiovascular: Regular rhythm Neurologic exam Initially awake alert but somewhat aphasic-mostly expressive aphasia which resolved within a  few minutes. Then she was able to tell her name, age, where she is at,  the correct month. Speech mildly dysarthric Add intermittent aphasia with complete resolution of symptoms Cranial nerves II to XII intact Motor examination with no drift in the upper extremities.  Bilateral lower extremities drift to the bed with limited exam due to pain in both legs.  At baseline does not walk without support. Sensation intact to light touch Coordination examination with no dysmetria  A little while later, the dysarthria improved, she only had lower extremity weakness limited by pain-which is her baseline.  No other deficits.  Labs/Imaging/Neurodiagnostic studies   CBC:  Recent Labs  Lab 11-03-2023 0630 10/14/23 1654 10/14/23 1656  WBC 11.8* 11.8*  --   NEUTROABS 6.7 7.9*  --   HGB 10.1* 10.6* 10.5*  HCT 30.3* 32.5* 31.0*  MCV 100.3* 103.2*  --   PLT 211 254  --    Basic Metabolic Panel:  Lab Results  Component Value Date   NA 137 10/14/2023   K 5.6 (H) 10/14/2023   CO2 19 (L) 2023-11-03   GLUCOSE 106 (H) 10/14/2023   BUN 17 10/14/2023   CREATININE 1.20 (H) 10/14/2023   CALCIUM 10.7 (H) Nov 03, 2023   CALCIUM 10.7 (H) 11-03-23   GFRNONAA 54 (L) 11-03-2023   GFRAA 43 (L) 07/29/2019   CT Head without contrast(Personally reviewed): No acute changes  CT angio Head and Neck with contrast(Personally reviewed): No ELVO  ASSESSMENT   RUKHSAR HARMES is a 89 y.o. female past history as above brought in for evaluation of speech difficulties.  Initially had some aphasia for the facility staff which resolved by the time EMS arrived.  En route, for EMS, had aphasia that lasted a few minutes which resolved.  In the hospital had a episode of aphasia for a few minutes-that also completely nearly resolved.  The only deficits on her exam are mostly related to pain related weakness of bilateral lower extremities. Given her history of hypertension hyperlipidemia and other risk factors, she is at risk  for strokes but at this point, symptoms completely resolved-hence not a candidate for thrombolysis (other than bilateral leg weakness which is baseline).  CT head and CT angio head and neck with no acute changes or emergent large vessel occlusion-she is also not a good candidate for thrombectomy due to poor modified Rankin at baseline. At this time, labs reveal mild leukocytosis and AKI.  She was recently treated for UTI-wonder if it is an partially treated or untreated UTI causing metabolic  Other differentials include stroke or seizure given the stereotypic symptoms.  Impression: Evaluate for metabolic encephalopathy versus stroke versus seizure.   RECOMMENDATIONS  Observation admission Frequent rechecks Telemetry MRI brain without contrast Check calcium-had hypercalcemia a week or so ago for which she was admitted. Routine EEG Check urinalysis Check chest x-ray Seizure precautions I will hold off on using any antiseizure medications for now unless MRI or EEG show abnormalities or symptoms recur with concern for seizure. Management of metabolic and infectious derangements per primary team.  Plan discussed with Dr. Monnie Anthony in the ED. Neurology to follow ______________________________________________________________________    Signed, Tona Francis, MD [facility]

## 2023-10-14 NOTE — ED Triage Notes (Signed)
 Pt to ED from [facility] as Code Stroke. +aphasia/garbled speech. Right grip weak. No thinners. A&O x 3 at baseline  CBG 140  157/84    HR 68 w/PVC's   100%RA

## 2023-10-15 ENCOUNTER — Observation Stay (HOSPITAL_COMMUNITY)

## 2023-10-15 DIAGNOSIS — R29818 Other symptoms and signs involving the nervous system: Secondary | ICD-10-CM | POA: Diagnosis not present

## 2023-10-15 DIAGNOSIS — Z820 Family history of epilepsy and other diseases of the nervous system: Secondary | ICD-10-CM | POA: Diagnosis not present

## 2023-10-15 DIAGNOSIS — G928 Other toxic encephalopathy: Secondary | ICD-10-CM | POA: Diagnosis not present

## 2023-10-15 DIAGNOSIS — R569 Unspecified convulsions: Secondary | ICD-10-CM | POA: Diagnosis not present

## 2023-10-15 DIAGNOSIS — G9341 Metabolic encephalopathy: Secondary | ICD-10-CM | POA: Diagnosis present

## 2023-10-15 DIAGNOSIS — E7800 Pure hypercholesterolemia, unspecified: Secondary | ICD-10-CM | POA: Diagnosis present

## 2023-10-15 DIAGNOSIS — R531 Weakness: Secondary | ICD-10-CM | POA: Diagnosis not present

## 2023-10-15 DIAGNOSIS — W19XXXD Unspecified fall, subsequent encounter: Secondary | ICD-10-CM | POA: Diagnosis present

## 2023-10-15 DIAGNOSIS — R627 Adult failure to thrive: Secondary | ICD-10-CM | POA: Diagnosis present

## 2023-10-15 DIAGNOSIS — I10 Essential (primary) hypertension: Secondary | ICD-10-CM | POA: Diagnosis present

## 2023-10-15 DIAGNOSIS — N390 Urinary tract infection, site not specified: Secondary | ICD-10-CM

## 2023-10-15 DIAGNOSIS — Z79899 Other long term (current) drug therapy: Secondary | ICD-10-CM | POA: Diagnosis not present

## 2023-10-15 DIAGNOSIS — Z79890 Hormone replacement therapy (postmenopausal): Secondary | ICD-10-CM | POA: Diagnosis not present

## 2023-10-15 DIAGNOSIS — B9620 Unspecified Escherichia coli [E. coli] as the cause of diseases classified elsewhere: Secondary | ICD-10-CM | POA: Diagnosis present

## 2023-10-15 DIAGNOSIS — S2242XD Multiple fractures of ribs, left side, subsequent encounter for fracture with routine healing: Secondary | ICD-10-CM | POA: Diagnosis not present

## 2023-10-15 DIAGNOSIS — Z90710 Acquired absence of both cervix and uterus: Secondary | ICD-10-CM | POA: Diagnosis not present

## 2023-10-15 DIAGNOSIS — Z806 Family history of leukemia: Secondary | ICD-10-CM | POA: Diagnosis not present

## 2023-10-15 DIAGNOSIS — Z6830 Body mass index (BMI) 30.0-30.9, adult: Secondary | ICD-10-CM | POA: Diagnosis not present

## 2023-10-15 DIAGNOSIS — E039 Hypothyroidism, unspecified: Secondary | ICD-10-CM | POA: Diagnosis present

## 2023-10-15 DIAGNOSIS — E7220 Disorder of urea cycle metabolism, unspecified: Secondary | ICD-10-CM | POA: Diagnosis present

## 2023-10-15 DIAGNOSIS — Z8673 Personal history of transient ischemic attack (TIA), and cerebral infarction without residual deficits: Secondary | ICD-10-CM | POA: Diagnosis not present

## 2023-10-15 DIAGNOSIS — M810 Age-related osteoporosis without current pathological fracture: Secondary | ICD-10-CM | POA: Diagnosis present

## 2023-10-15 DIAGNOSIS — R479 Unspecified speech disturbances: Secondary | ICD-10-CM

## 2023-10-15 DIAGNOSIS — Z87891 Personal history of nicotine dependence: Secondary | ICD-10-CM | POA: Diagnosis not present

## 2023-10-15 DIAGNOSIS — R4701 Aphasia: Secondary | ICD-10-CM | POA: Diagnosis present

## 2023-10-15 DIAGNOSIS — Z87440 Personal history of urinary (tract) infections: Secondary | ICD-10-CM | POA: Diagnosis not present

## 2023-10-15 DIAGNOSIS — N179 Acute kidney failure, unspecified: Secondary | ICD-10-CM | POA: Diagnosis present

## 2023-10-15 DIAGNOSIS — I6782 Cerebral ischemia: Secondary | ICD-10-CM | POA: Diagnosis not present

## 2023-10-15 DIAGNOSIS — R9089 Other abnormal findings on diagnostic imaging of central nervous system: Secondary | ICD-10-CM | POA: Diagnosis not present

## 2023-10-15 DIAGNOSIS — Z8249 Family history of ischemic heart disease and other diseases of the circulatory system: Secondary | ICD-10-CM | POA: Diagnosis not present

## 2023-10-15 DIAGNOSIS — D638 Anemia in other chronic diseases classified elsewhere: Secondary | ICD-10-CM | POA: Diagnosis present

## 2023-10-15 LAB — CBC
HCT: 27.4 % — ABNORMAL LOW (ref 36.0–46.0)
Hemoglobin: 9.1 g/dL — ABNORMAL LOW (ref 12.0–15.0)
MCH: 32.6 pg (ref 26.0–34.0)
MCHC: 33.2 g/dL (ref 30.0–36.0)
MCV: 98.2 fL (ref 80.0–100.0)
Platelets: 232 10*3/uL (ref 150–400)
RBC: 2.79 MIL/uL — ABNORMAL LOW (ref 3.87–5.11)
RDW: 13.2 % (ref 11.5–15.5)
WBC: 11.6 10*3/uL — ABNORMAL HIGH (ref 4.0–10.5)
nRBC: 0 % (ref 0.0–0.2)

## 2023-10-15 LAB — LIPID PANEL
Cholesterol: 107 mg/dL (ref 0–200)
HDL: 34 mg/dL — ABNORMAL LOW (ref >40)
LDL Cholesterol: 60 mg/dL (ref 0–99)
Total CHOL/HDL Ratio: 3.1 ratio
Triglycerides: 67 mg/dL (ref <150)
VLDL: 13 mg/dL (ref 0–40)

## 2023-10-15 LAB — HEMOGLOBIN A1C
Hgb A1c MFr Bld: 5.9 % — ABNORMAL HIGH (ref 4.8–5.6)
Mean Plasma Glucose: 123 mg/dL

## 2023-10-15 LAB — RENAL FUNCTION PANEL
Albumin: 2.8 g/dL — ABNORMAL LOW (ref 3.5–5.0)
Anion gap: 8 (ref 5–15)
BUN: 12 mg/dL (ref 8–23)
CO2: 21 mmol/L — ABNORMAL LOW (ref 22–32)
Calcium: 11.5 mg/dL — ABNORMAL HIGH (ref 8.9–10.3)
Chloride: 108 mmol/L (ref 98–111)
Creatinine, Ser: 1.05 mg/dL — ABNORMAL HIGH (ref 0.44–1.00)
GFR, Estimated: 51 mL/min — ABNORMAL LOW (ref >60)
Glucose, Bld: 92 mg/dL (ref 70–99)
Phosphorus: 3 mg/dL (ref 2.5–4.6)
Potassium: 4 mmol/L (ref 3.5–5.1)
Sodium: 137 mmol/L (ref 135–145)

## 2023-10-15 LAB — VITAMIN D 25 HYDROXY (VIT D DEFICIENCY, FRACTURES): Vit D, 25-Hydroxy: 62.4 ng/mL (ref 30–100)

## 2023-10-15 LAB — GLUCOSE, CAPILLARY: Glucose-Capillary: 111 mg/dL — ABNORMAL HIGH (ref 70–99)

## 2023-10-15 MED ORDER — ACYCLOVIR 400 MG PO TABS
400.0000 mg | ORAL_TABLET | Freq: Every day | ORAL | Status: DC
  Administered 2023-10-15 – 2023-10-19 (×5): 400 mg via ORAL
  Filled 2023-10-15 (×6): qty 1

## 2023-10-15 MED ORDER — SODIUM CHLORIDE 0.9 % IV SOLN: INTRAVENOUS | Status: DC

## 2023-10-15 MED ORDER — CYANOCOBALAMIN 1000 MCG/ML IJ SOLN
1000.0000 ug | Freq: Every day | INTRAMUSCULAR | Status: AC
  Administered 2023-10-15 – 2023-10-16 (×2): 1000 ug via SUBCUTANEOUS
  Filled 2023-10-15 (×2): qty 1

## 2023-10-15 MED ORDER — AMLODIPINE BESYLATE 5 MG PO TABS
5.0000 mg | ORAL_TABLET | Freq: Every day | ORAL | Status: DC
  Administered 2023-10-15 – 2023-10-19 (×5): 5 mg via ORAL
  Filled 2023-10-15 (×5): qty 1

## 2023-10-15 NOTE — Progress Notes (Signed)
 NEUROLOGY CONSULT FOLLOW UP NOTE   Date of service: Oct 15, 2023 Patient Name: Kimberly Webb MRN:  [id number] DOB:  01-11-1933  Interval Hx/subjective  Seen and examined.  No acute findings overnight on imaging  Vitals   Vitals:   10/15/23 0152 10/15/23 0200 10/15/23 0415 10/15/23 0753  BP:  91/77 (!) 155/114 (!) 132/51  Pulse:  74 65 (!) 59  Resp:  (!) 22 19 17   Temp: 98.3 F (36.8 C)  98.6 F (37 C) 99 F (37.2 C)  TempSrc: Oral  Oral Oral  SpO2:  97% 98% 94%  Weight:      Height:         Body mass index is 30.24 kg/m.  Physical Exam   General: Awake alert in no distress HNT: Normocephalic atraumatic Lungs clear Neurological exam Awake alert oriented x 3 Mild dysarthria No evidence of aphasia Mildly diminished attention concentration Cranial nerves II to XII intact Motor examination with no drift in bilateral upper extremities Bilateral lower extremities have drift-unsure how much of this is due to pain versus true weakness. Sensation intact light touch bilaterally but on double simultaneous stimulation, almost has a neglect for the right-which is unreliable given her attention concentration  Medications  Current Facility-Administered Medications:     stroke: early stages of recovery book, , Does not apply, Once, Kenny Peals, MD   0.9 %  sodium chloride  infusion, , Intravenous, Continuous, Kenny Peals, MD, Last Rate: 125 mL/hr at 10/15/23 0226, New Bag at 10/15/23 0226   acetaminophen  (TYLENOL ) tablet 650 mg, 650 mg, Oral, Q4H PRN **OR** acetaminophen  (TYLENOL ) 160 MG/5ML solution 650 mg, 650 mg, Per Tube, Q4H PRN **OR** acetaminophen  (TYLENOL ) suppository 650 mg, 650 mg, Rectal, Q4H PRN, Lydia Sams, Vishal R, MD   enoxaparin  (LOVENOX ) injection 40 mg, 40 mg, Subcutaneous, Q24H, Patel, Vishal R, MD, 40 mg at 10/14/23 2306   levothyroxine  (SYNTHROID ) tablet 100 mcg, 100 mcg, Oral, q morning, Patel, Vishal R, MD   melatonin tablet 6 mg, 6 mg, Oral, QHS, Patel,  Vishal R, MD, 6 mg at 10/15/23 0226   senna-docusate (Senokot-S) tablet 1 tablet, 1 tablet, Oral, QHS PRN, Edith Gores R, MD   simvastatin  (ZOCOR ) tablet 40 mg, 40 mg, Oral, QHS, Patel, Vishal R, MD   thiamine  (VITAMIN B1) tablet 100 mg, 100 mg, Oral, Daily, Kenny Peals, MD  Labs and Diagnostic Imaging   CBC:  Recent Labs  Lab 10/14/23 1654 10/14/23 1656 10/15/23 0605  WBC 11.8*  --  11.6*  NEUTROABS 7.9*  --   --   HGB 10.6* 10.5* 9.1*  HCT 32.5* 31.0* 27.4*  MCV 103.2*  --  98.2  PLT 254  --  232    Basic Metabolic Panel:  Lab Results  Component Value Date   NA 137 10/15/2023   K 4.0 10/15/2023   CO2 21 (L) 10/15/2023   GLUCOSE 92 10/15/2023   BUN 12 10/15/2023   CREATININE 1.05 (H) 10/15/2023   CALCIUM 11.5 (H) 10/15/2023   GFRNONAA 51 (L) 10/15/2023   GFRAA 43 (L) 07/29/2019   Lipid Panel:  Lab Results  Component Value Date   LDLCALC 60 10/15/2023   HgbA1c: No results found for: "HGBA1C" Urine Drug Screen:     Component Value Date/Time   LABOPIA NONE DETECTED 10/02/2023 1429   COCAINSCRNUR NONE DETECTED 10/02/2023 1429   LABBENZ NONE DETECTED 10/02/2023 1429   AMPHETMU NONE DETECTED 10/02/2023 1429   THCU NONE DETECTED 10/02/2023 1429  LABBARB NONE DETECTED 10/02/2023 1429    Alcohol Level     Component Value Date/Time   [facility] <15 10/14/2023 1653   INR  Lab Results  Component Value Date   INR 1.1 10/14/2023   APTT  Lab Results  Component Value Date   APTT 26 10/14/2023   Creatinine mildly elevated.  Mild leukocytosis.  Urinalysis with trace leuk esterase, 6-10 WBCs.  CT Head without contrast(Personally reviewed): No acute changes  CT angio Head and Neck with contrast(Personally reviewed): No ELVO  MRI Brain(Personally reviewed): No acute findings.  Chronic small vessel ischemia and volume loss.  rEEG:  Continuous generalized slowing.  Moderate diffuse encephalopathy.  No seizures.  No epileptiform activity.  Assessment    Kimberly Webb is a 89 y.o. female past history of hypertension hyperlipidemia hypothyroidism failure to thrive recent admission for AKI, failure to thrive, hypercalcemia and multiple closed fractures of the ribs as well as protein calorie malnutrition presented from her facility for evaluation of speech difficulties.  Other than intermittent speech difficulties, she had a pretty nonfocal exam.  She did have a fluctuating course where she would have speech difficulties akin to aphasia which would then resolve and recur.  Episodes have not recurred in the hospital. Given what I saw with the episodes, my suspicion of this being a TIA is not very high.  I think some of these episodes might be just behavioral in etiology.  Metabolic derangements are likely the culprit of this.  Impression: Toxic metabolic encephalopathy due to hypercalcemia and possible UTI   Recommendations  Management of metabolic derangements per primary team as you are UTI was recently treated-will defer if she needs any further course of antibiotics. No need for antiseizure medications Low suspicion for this being TIA and MRI is negative for stroke. Therapy assessments Outpatient neurological follow-up in 8 to 12 weeks Inpatient neurology will be available as needed Dr. Jonelle Neri was notified of the plan. ______________________________________________________________________   Signed, Tona Francis, MD [facility]

## 2023-10-15 NOTE — ED Notes (Signed)
 Pt back from MRI

## 2023-10-15 NOTE — ED Notes (Signed)
 Pt to MRI, awake alert, vital signs WNL

## 2023-10-15 NOTE — Evaluation (Signed)
 Physical Therapy Evaluation Patient Details Name: CHARL HERROLD MRN: [id number] DOB: Oct 07, 1932 Today's Date: 10/15/2023  History of Present Illness  KAYSIE GLAZENER is a 89 y.o. female presented from her facility for evaluation of speech difficulties. Past history of hypertension hyperlipidemia hypothyroidism failure to thrive recent admission for AKI, failure to thrive, hypercalcemia and multiple closed fractures of the ribs as well as protein calorie malnutrition.  Clinical Impression  Pt presents with admitting diagnosis above. Pt today required +2 Min/Mod A to stand. Mod A to step to chair with patient sitting prematurely. Pt with 2 episodes of urinary incontinence once standing. Pt is from SNF and recommend pt return to SNF upon DC. PT will continue to follow.          If plan is discharge home, recommend the following: Two people to help with walking and/or transfers;Two people to help with bathing/dressing/bathroom   Can travel by private vehicle   No    Equipment Recommendations None recommended by PT  Recommendations for Other Services       Functional Status Assessment Patient has had a recent decline in their functional status and demonstrates the ability to make significant improvements in function in a reasonable and predictable amount of time.     Precautions / Restrictions Precautions Precautions: Fall Recall of Precautions/Restrictions: Impaired Precaution/Restrictions Comments: L rib fxs Restrictions Weight Bearing Restrictions Per Provider Order: No      Mobility  Bed Mobility Overal bed mobility: Needs Assistance Bed Mobility: Supine to Sit     Supine to sit: Supervision, HOB elevated, Used rails     General bed mobility comments: Supervision to sit EOB with increased time.    Transfers Overall transfer level: Needs assistance Equipment used: Rolling walker (2 wheels) Transfers: Sit to/from Stand, Bed to chair/wheelchair/BSC Sit to Stand: +2  physical assistance, Min assist, Mod assist   Step pivot transfers: Mod assist       General transfer comment: +2 Min/Mod A to power up. Mod to step to chair with patient sitting prematurely. Pt with 2 episodes of urinary incontinence once standing.    Ambulation/Gait               General Gait Details: deferred  Stairs            Wheelchair Mobility     Tilt Bed    Modified Rankin (Stroke Patients Only)       Balance Overall balance assessment: Needs assistance Sitting-balance support: No upper extremity supported, Feet supported Sitting balance-Leahy Scale: Fair     Standing balance support: Bilateral upper extremity supported Standing balance-Leahy Scale: Poor Standing balance comment: Reliant on RW                             Pertinent Vitals/Pain Pain Assessment Pain Assessment: No/denies pain    Home Living Family/patient expects to be discharged to:: Skilled nursing facility                        Prior Function Prior Level of Function : Needs assist             Mobility Comments: Per chart review pt is WC level at baseline       Extremity/Trunk Assessment   Upper Extremity Assessment Upper Extremity Assessment: Generalized weakness    Lower Extremity Assessment Lower Extremity Assessment: Generalized weakness    Cervical / Trunk Assessment Cervical / Trunk  Assessment: Normal  Communication   Communication Communication: No apparent difficulties    Cognition Arousal: Alert Behavior During Therapy: Flat affect   PT - Cognitive impairments: Difficult to assess                       PT - Cognition Comments: Pt A&Ox4 however noted with very flat affect. Following commands: Impaired Following commands impaired: Follows one step commands with increased time     Cueing Cueing Techniques: Verbal cues, Gestural cues     General Comments General comments (skin integrity, edema, etc.): VSS     Exercises     Assessment/Plan    PT Assessment Patient needs continued PT services  PT Problem List Decreased strength;Decreased mobility;Decreased safety awareness;Decreased activity tolerance;Decreased balance;Decreased knowledge of use of DME;Pain;Decreased cognition;Decreased skin integrity       PT Treatment Interventions DME instruction;Therapeutic activities;Gait training;Therapeutic exercise;Patient/family education;Balance training;Stair training;Functional mobility training;Neuromuscular re-education    PT Goals (Current goals can be found in the Care Plan section)  Acute Rehab PT Goals Patient Stated Goal: to get better PT Goal Formulation: With patient Time For Goal Achievement: 10/29/23 Potential to Achieve Goals: Fair    Frequency Min 2X/week     Co-evaluation               AM-PAC PT "6 Clicks" Mobility  Outcome Measure Help needed turning from your back to your side while in a flat bed without using bedrails?: A Lot Help needed moving from lying on your back to sitting on the side of a flat bed without using bedrails?: Total Help needed moving to and from a bed to a chair (including a wheelchair)?: Total Help needed standing up from a chair using your arms (e.g., wheelchair or bedside chair)?: Total Help needed to walk in hospital room?: Total Help needed climbing 3-5 steps with a railing? : Total 6 Click Score: 7    End of Session Equipment Utilized During Treatment: Gait belt Activity Tolerance: Patient tolerated treatment well Patient left: in chair;with call bell/phone within reach;with chair alarm set;with nursing/sitter in room Nurse Communication: Mobility status PT Visit Diagnosis: Other abnormalities of gait and mobility (R26.89);Muscle weakness (generalized) (M62.81);Adult, failure to thrive (R62.7)    Time: 1120-1136 PT Time Calculation (min) (ACUTE ONLY): 16 min   Charges:   PT Evaluation $PT Eval Moderate Complexity: 1 Mod   PT  General Charges $$ ACUTE PT VISIT: 1 Visit         Rodgers Clack, PT, DPT Acute Rehab Services [phone]   Rodricus Candelaria 10/15/2023, 3:58 PM

## 2023-10-15 NOTE — Evaluation (Signed)
 Speech Language Pathology Evaluation Patient Details Name: Kimberly Webb MRN: [id number] DOB: Jun 25, 1932 Today's Date: 10/15/2023 Time: 3875-6433 SLP Time Calculation (min) (ACUTE ONLY): 14 min  Problem List:  Patient Active Problem List   Diagnosis Date Noted   Speech abnormality 10/14/2023   Essential hypertension 10/14/2023   Protein-calorie malnutrition, severe 10/04/2023   Failure to thrive in adult 10/03/2023   Hypercalcemia 10/03/2023   Generalized weakness 10/03/2023   Multiple closed fractures of ribs of left side 10/03/2023   AKI (acute kidney injury) (HCC) 10/02/2023   C1 cervical fracture (HCC) 07/21/2019   Neck fracture (HCC) 07/21/2019   Vaginal discharge 09/07/2016   Menopause 09/06/2016   History of herpes genitalis 04/04/2016   Skin mole 04/01/2013   STD (sexually transmitted disease)    Rectocele    Atrophic vaginitis    Osteoporosis    Hypercholesteremia    Arthritis    Hypothyroidism    Past Medical History:  Past Medical History:  Diagnosis Date   Arthritis    Atrophic vaginitis    Broken collarbone    Broken neck (HCC)    Broken ribs    Cystocele    Elevated serum creatinine    decreased GFR   Fibroid    Hypercholesteremia    Hypertension    Hypothyroidism    Osteoporosis    Paratubal cyst    Rectocele    STD (sexually transmitted disease)    HSV   Uterine prolapse    Past Surgical History:  Past Surgical History:  Procedure Laterality Date   BROKEN ANKLE     BROKEN FEMUR     CATARACT EXTRACTION Bilateral    FOOT SURGERY Right    TOE   HERNIA REPAIR  2009   OOPHORECTOMY     BSO WITH VAG HYST IN 2004   REPLACEMENT TOTAL KNEE  2011   LEFT X 2   THYROIDECTOMY, PARTIAL     VAGINAL HYSTERECTOMY  2004   VAG HYST, BSO, A&P REPAIR   HPI:  Kimberly Webb is a 89 y.o. female with medical history significant for HTN, HLD, hypothyroidism who presented to the ED from SNF for evaluation of speech abnormality. Patient was recently  admitted 4/30-5/6 for generalized weakness and failure to thrive after a fall at home and seen by ST for swallow where she discharged on a puree diet. Noted to have difficulty speaking with apparent aphasia and occasional slurred speech. Per chart this resolved after a short period of time but then recurred again en route to the ED with EMS.  Symptoms again resolved by the time she got her CT scan but then developed difficulty with speech again afterwards. This resolved again after a few minutes. MRI No acute intracranial abnormality.   Assessment / Plan / Recommendation Clinical Impression  Pt familiar to this SLP who worked with pt during last weeks admission for dysphagia (daughter not present). Compatatively, pt is more responsive/engaging without processing delays that were informally noted prior. She was able to comprehend without difficulty and express her thoughts in dialogue with SLP and when asked to describe an activity. She is adequately oriented, able to provide solutions to hypothetical situations and name objects without difficulty. Her divergent naming was decreased naming 5 items in a simple category. Uncertain of her baseline cognition and suspect her verbal ability may be better than her implementation. She may have deficits with higher level cognition however for the acute setting she seems functional and no further ST is indicated.  She may benefit from more cognitive assessment at SNF (if returns and is warranted).    SLP Assessment  SLP Recommendation/Assessment: All further Speech Lanaguage Pathology  needs can be addressed in the next venue of care SLP Visit Diagnosis: Cognitive communication deficit (R41.841)    Recommendations for follow up therapy are one component of a multi-disciplinary discharge planning process, led by the attending physician.  Recommendations may be updated based on patient status, additional functional criteria and insurance authorization.    Follow Up  Recommendations  Skilled nursing-short term rehab (<3 hours/day)    Assistance Recommended at Discharge     Functional Status Assessment Patient has not had a recent decline in their functional status  Frequency and Duration           SLP Evaluation Cognition  Overall Cognitive Status:  (suspect at baseline or fairly better than when this SLP met pt last week, no family present) Arousal/Alertness: Awake/alert Orientation Level: Oriented to person;Oriented to place;Oriented to situation (oriented to year- initially 1925 but correct with prompt) Month: May Attention: Sustained Sustained Attention: Appears intact Memory:  (not formally assessed, suspect may have some baseline memory deficits, no family to confirm) Awareness: Appears intact (for basic) Problem Solving: Appears intact (for verbal basic) Safety/Judgment: Appears intact (from informal measure)       Comprehension  Auditory Comprehension Overall Auditory Comprehension: Appears within functional limits for tasks assessed Visual Recognition/Discrimination Discrimination: Not tested Reading Comprehension Reading Status: Not tested    Expression Expression Primary Mode of Expression: Verbal Verbal Expression Overall Verbal Expression: Appears within functional limits for tasks assessed Initiation: No impairment Level of Generative/Spontaneous Verbalization: Sentence Repetition:  (NT) Naming: Impairment Divergent:  (named 5 animals in one minute) Written Expression Written Expression: Not tested   Oral / Motor  Oral Motor/Sensory Function Overall Oral Motor/Sensory Function: Within functional limits Motor Speech Overall Motor Speech: Appears within functional limits for tasks assessed Respiration: Within functional limits Phonation: Normal Resonance: Within functional limits Articulation: Within functional limitis Intelligibility: Intelligible Motor Planning: Witnin functional limits Motor Speech Errors: Not  applicable            Kimberly Webb 10/15/2023, 11:45 AM

## 2023-10-15 NOTE — Progress Notes (Signed)
 Pt arrived to unit and was placed in room by staff. No report received for this patient. Pt is alert and oriented to self and place. Unsure d/t slurred speech as to patient orientation to situation or time. Pt c/o pain in BLEs but cannot give any descriptors of pain. Skin was assessed with [facility]. Pt is resting in bed with wheels locked in place. Bed is in lowest position and call light is within reach. Will continue to monitor.

## 2023-10-15 NOTE — Progress Notes (Signed)
 Kimberly Webb  ZOX:[id number] DOB: 09-14-1932 DOA: 10/14/2023 PCP: [name], MD    Brief Narrative:  89 year old with a history of HTN, HLD, and hypothyroidism who presented to the ER 5/12 after she was found by staff at her SNF rehab facility with expressive aphasia and dysarthria.  Her symptoms resolved spontaneously after a short time, but then recurred when en route to the ER with EMS.  In the ER CT head was negative for acute abnormality.  CTa head and neck revealed no LVO.  Of note she was admitted to the hospital 4/30-5/6 with a UTI, metabolic encephalopathy, generalized failure to thrive, and a fall at home which resulted in multiple left-sided rib fractures.  Goals of Care:   Code Status: Full Code   DVT prophylaxis: enoxaparin  (LOVENOX ) injection 40 mg Start: 10/14/23 2130   Interim Hx: Afebrile since admission.  Vital signs stable.  Alert and conversant at the time of my exam.  Has no new complaints.  States she is feeling much better overall.  Denies chest pain or shortness of breath.  Assessment & Plan:  Intermittent expressive aphasia with dysarthria -multifactorial toxic and metabolic encephalopathy CT and CTa head and neck without acute findings - MRI brain with no acute abnormality - EEG without evidence of seizure activity -no evidence of CVA and symptoms not strongly suggestive of TIA -suspect this represents a multifactorial metabolic encephalopathy with perhaps a significant contribution from hypercalcemia-ammonia modestly elevated at 53 so we will follow trend after hydration -B12 suboptimal earlier this month at 340 therefore will supplement empirically -folate normal -records indicate she was started on acyclovir  5/7 but she is able to tell me she has been on this for many many years therefore I do not feel this is likely contributing  Suspected dementia Noted during recent hospital stay - was to undergo outpt neuropsych testing   Hypercalcemia With albumin of  2.8 calcium corrects to 12.5 -  PTH 10/08/23 normal, suggesting possible primary hyperparathyroidism as etiology -rule out vitamin D  deficiency - consider 24-hour urine calcium excretion and renal ultrasound to evaluate for nephrolithiasis -given advanced age and multiple comorbidities patient would be a poor surgical candidate -consider Cinacalcet treatment after workup completed -further investigation may not be required if her calcium improves and follow-up tomorrow with simple volume expansion  Osteoporosis Continue Prolia  and vitamin D  supplementation  Recent multi bacterial UTI with persistent abnormal UA Repeat urine culture pending  Multiple left-sided rib fractures Patient reports pain in her left chest is actually well-controlled  HTN Blood pressure reasonably controlled at present  HLD Continue usual outpatient medication  Hypothyroidism Continue usual Synthroid  dose  Family Communication: No family present at time of exam Disposition: Anticipate return to SNF, possibly within 24 hours if she continues to improve at her current rate   Objective: Blood pressure (!) 132/51, pulse (!) 59, temperature 99 F (37.2 C), temperature source Oral, resp. rate 17, height 5\' 2"  (1.575 m), weight 75 kg, SpO2 94%. No intake or output data in the 24 hours ending 10/15/23 0934 Filed Weights   10/14/23 1656 10/14/23 1728  Weight: 75 kg 75 kg    Examination: General: No acute respiratory distress Lungs: Clear to auscultation bilaterally without wheezes or crackles Cardiovascular: Regular rate and rhythm without murmur gallop or rub normal S1 and S2 Abdomen: Nontender, nondistended, soft, bowel sounds positive, no rebound, no ascites, no appreciable mass Extremities: No significant cyanosis, clubbing, or edema bilateral lower extremities  CBC: Recent Labs  Lab  10/14/23 1654 10/14/23 1656 10/15/23 0605  WBC 11.8*  --  11.6*  NEUTROABS 7.9*  --   --   HGB 10.6* 10.5* 9.1*  HCT  32.5* 31.0* 27.4*  MCV 103.2*  --  98.2  PLT 254  --  232   Basic Metabolic Panel: Recent Labs  Lab 10/08/23 1052 10/14/23 1656 10/14/23 2141 10/15/23 0605  NA 143 137 138 137  K 3.8 5.6* 4.1 4.0  CL 115* 107 106 108  CO2 19*  --  24 21*  GLUCOSE 141* 106* 103* 92  BUN 25* 17 12 12   CREATININE 1.00 1.20* 1.12* 1.05*  CALCIUM 10.7*  10.7*  --  11.7* 11.5*  PHOS  --   --   --  3.0   GFR: Estimated Creatinine Clearance: 35.1 mL/min (A) (by C-G formula based on SCr of 1.05 mg/dL (H)).   Scheduled Meds:  enoxaparin  (LOVENOX ) injection  40 mg Subcutaneous Q24H   levothyroxine   100 mcg Oral q morning   melatonin  6 mg Oral QHS   simvastatin   40 mg Oral QHS   thiamine   100 mg Oral Daily   Continuous Infusions:  sodium chloride  125 mL/hr at 10/15/23 0226     LOS: 0 days   Abbe Abate, MD [facility] Office  [phone] Pager - Text Page per Tilford Foley  If 7PM-7AM, please contact night-coverage per Amion 10/15/2023, 9:34 AM

## 2023-10-15 NOTE — Plan of Care (Signed)
 Passed bedside swallowing. Talked with daughter and she said her mother has been on a regular diet at rehab facility. OOB to chair with assistance . Urine sent to lab. Assisted with meals.     Problem: Education: Goal: Knowledge of disease or condition will improve Outcome: Progressing   Problem: Coping: Goal: Will verbalize positive feelings about self Outcome: Progressing   Problem: Self-Care: Goal: Ability to communicate needs accurately will improve Outcome: Progressing   Problem: Nutrition: Goal: Risk of aspiration will decrease Outcome: Progressing   Problem: Safety: Goal: Ability to remain free from injury will improve Outcome: Progressing   Problem: Skin Integrity: Goal: Risk for impaired skin integrity will decrease Outcome: Progressing

## 2023-10-15 NOTE — Procedures (Signed)
 Patient Name: Kimberly Webb  MRN: [id number]  Epilepsy Attending: Arleene Lack  Referring Physician/Provider:  Date: 10/14/2023 Duration: 21.27 mins  Patient history: 89yo F with speech disturbance. EEG to evaluate for seizure  Level of alertness: Awake  AEDs during EEG study: None  Technical aspects: This EEG study was done with scalp electrodes positioned according to the 10-20 International system of electrode placement. Electrical activity was reviewed with band pass filter of 1-70Hz , sensitivity of 7 uV/mm, display speed of 28mm/sec with a 60Hz  notched filter applied as appropriate. EEG data were recorded continuously and digitally stored.  Video monitoring was available and reviewed as appropriate.  Description: EEG showed continuous generalized polymorphic 3 to 7 Hz theta-delta slowing. Hyperventilation and photic stimulation were not performed.     ABNORMALITY - Continuous slow, generalized  IMPRESSION: This study is suggestive of moderate diffuse encephalopathy. No seizures or epileptiform discharges were seen throughout the recording.  Mayo Owczarzak O Michaeal Davis

## 2023-10-16 DIAGNOSIS — E039 Hypothyroidism, unspecified: Secondary | ICD-10-CM

## 2023-10-16 DIAGNOSIS — I10 Essential (primary) hypertension: Secondary | ICD-10-CM

## 2023-10-16 DIAGNOSIS — G9341 Metabolic encephalopathy: Secondary | ICD-10-CM

## 2023-10-16 DIAGNOSIS — R479 Unspecified speech disturbances: Secondary | ICD-10-CM | POA: Diagnosis not present

## 2023-10-16 LAB — COMPREHENSIVE METABOLIC PANEL WITH GFR
ALT: 15 U/L (ref 0–44)
AST: 17 U/L (ref 15–41)
Albumin: 2.7 g/dL — ABNORMAL LOW (ref 3.5–5.0)
Alkaline Phosphatase: 46 U/L (ref 38–126)
Anion gap: 8 (ref 5–15)
BUN: 14 mg/dL (ref 8–23)
CO2: 22 mmol/L (ref 22–32)
Calcium: 11.2 mg/dL — ABNORMAL HIGH (ref 8.9–10.3)
Chloride: 110 mmol/L (ref 98–111)
Creatinine, Ser: 1.14 mg/dL — ABNORMAL HIGH (ref 0.44–1.00)
GFR, Estimated: 46 mL/min — ABNORMAL LOW (ref >60)
Glucose, Bld: 97 mg/dL (ref 70–99)
Potassium: 3.9 mmol/L (ref 3.5–5.1)
Sodium: 140 mmol/L (ref 135–145)
Total Bilirubin: 0.5 mg/dL (ref 0.0–1.2)
Total Protein: 5.9 g/dL — ABNORMAL LOW (ref 6.5–8.1)

## 2023-10-16 LAB — CBC
HCT: 26.9 % — ABNORMAL LOW (ref 36.0–46.0)
Hemoglobin: 9 g/dL — ABNORMAL LOW (ref 12.0–15.0)
MCH: 33.5 pg (ref 26.0–34.0)
MCHC: 33.5 g/dL (ref 30.0–36.0)
MCV: 100 fL (ref 80.0–100.0)
Platelets: 229 10*3/uL (ref 150–400)
RBC: 2.69 MIL/uL — ABNORMAL LOW (ref 3.87–5.11)
RDW: 13.2 % (ref 11.5–15.5)
WBC: 9.3 10*3/uL (ref 4.0–10.5)
nRBC: 0 % (ref 0.0–0.2)

## 2023-10-16 LAB — URINE CULTURE

## 2023-10-16 LAB — AMMONIA: Ammonia: 23 umol/L (ref 9–35)

## 2023-10-16 LAB — CALCITRIOL (1,25 DI-OH VIT D): Vit D, 1,25-Dihydroxy: 11.5 pg/mL — ABNORMAL LOW (ref 24.8–81.5)

## 2023-10-16 MED ORDER — FUROSEMIDE 10 MG/ML IJ SOLN
20.0000 mg | Freq: Once | INTRAMUSCULAR | Status: AC
  Administered 2023-10-16: 20 mg via INTRAVENOUS
  Filled 2023-10-16: qty 4

## 2023-10-16 NOTE — Plan of Care (Signed)
  Problem: Education: Goal: Knowledge of disease or condition will improve Outcome: Progressing   Problem: Health Behavior/Discharge Planning: Goal: Ability to manage health-related needs will improve Outcome: Progressing   Problem: Self-Care: Goal: Ability to participate in self-care as condition permits will improve Outcome: Progressing

## 2023-10-16 NOTE — TOC Initial Note (Signed)
 Transition of Care [facility]) - Initial/Assessment Note    Patient Details  Name: Kimberly Webb MRN: [id number] Date of Birth: [birth year]  [facility] CM/SW Contact:    Tandy Fam, LCSW Phone Number: 10/16/2023, 11:32 AM  Clinical Narrative:   Patient admitted from [facility] where she was receiving rehabilitation. CSW confirmed with daughter [name] that plan is to return to [facility] to continue rehab at discharge. CSW confirmed with [facility] that bed is still available for patient when stable. Awaiting completion of medical workup.  CSW to follow.              Expected Discharge Plan: Skilled Nursing Facility Barriers to Discharge: Continued Medical Work up   Patient Goals and CMS Choice Patient states their goals for this hospitalization and ongoing recovery are:: patient unable to participate in goal setting, not fully oriented CMS Medicare.gov Compare Post Acute Care list provided to:: Patient Represenative (must comment) Choice offered to / list presented to : Adult Children [place] ownership interest in [facility].provided to:: Adult Children    Expected Discharge Plan and Services     Post Acute Care Choice: Skilled Nursing Facility Living arrangements for the past 2 months: Single Family Home                                      Prior Living Arrangements/Services Living arrangements for the past 2 months: Single Family Home Lives with:: Adult Children Patient language and need for interpreter reviewed:: No Do you feel safe going back to the place where you live?: Yes      Need for Family Participation in Patient Care: Yes (Comment) Care giver support system in place?: Yes (comment)   Criminal Activity/Legal Involvement Pertinent to Current Situation/Hospitalization: No - Comment as needed  Activities of Daily Living   ADL Screening (condition at time of admission) Independently performs ADLs?: No Does the patient  have a NEW difficulty with bathing/dressing/toileting/self-feeding that is expected to last >3 days?: Yes (Initiates electronic notice to provider for possible OT consult) Does the patient have a NEW difficulty with getting in/out of bed, walking, or climbing stairs that is expected to last >3 days?: Yes (Initiates electronic notice to provider for possible PT consult) Does the patient have a NEW difficulty with communication that is expected to last >3 days?: Yes (Initiates electronic notice to provider for possible SLP consult) Is the patient deaf or have difficulty hearing?: No Does the patient have difficulty seeing, even when wearing glasses/contacts?: No Does the patient have difficulty concentrating, remembering, or making decisions?: Yes  Permission Sought/Granted Permission sought to share information with : Facility Medical sales representative, Family Supports Permission granted to share information with : Yes, Verbal Permission Granted  Share Information with NAME: Dollene Fresh  Permission granted to share info w AGENCY: [facility]  Permission granted to share info w Relationship: Daughters     Emotional Assessment   Attitude/Demeanor/Rapport: Unable to Assess Affect (typically observed): Unable to Assess Orientation: : Oriented to Self, Oriented to Place Alcohol / Substance Use: Not Applicable Psych Involvement: No (comment)  Admission diagnosis:  Weakness [R53.1] Speech abnormality [R47.9] Difficulty with speech [R47.9] Acute metabolic encephalopathy [G93.41] Patient Active Problem List   Diagnosis Date Noted   Acute metabolic encephalopathy 10/15/2023   Speech abnormality 10/14/2023   Essential hypertension 10/14/2023   Protein-calorie malnutrition, severe 10/04/2023   Failure  to thrive in adult 10/03/2023   Hypercalcemia 10/03/2023   Generalized weakness 10/03/2023   Multiple closed fractures of ribs of left side 10/03/2023   AKI (acute kidney injury) (HCC)  10/02/2023   C1 cervical fracture (HCC) 07/21/2019   Neck fracture (HCC) 07/21/2019   Vaginal discharge 09/07/2016   Menopause 09/06/2016   History of herpes genitalis 04/04/2016   Skin mole 04/01/2013   STD (sexually transmitted disease)    Rectocele    Atrophic vaginitis    Osteoporosis    Hypercholesteremia    Arthritis    Hypothyroidism    PCP:  [name], MD Pharmacy:   [id number] - [place], [place] - [address] AT [address]OSS [address] [place] [address] Phone: [phone] Fax: [phone]  [facility] - [place], [place] - [address] [place] [address] Phone: [phone] Fax: [phone]     Social Drivers of Health (SDOH) Social History: SDOH Screenings   Food Insecurity: No Food Insecurity (10/16/2023)  Housing: Low Risk  (10/16/2023)  Transportation Needs: No Transportation Needs (10/16/2023)  Utilities: Not At Risk (10/16/2023)  Social Connections: Moderately Integrated (10/16/2023)  Tobacco Use: Medium Risk (10/14/2023)   SDOH Interventions:     Readmission Risk Interventions     No data to display

## 2023-10-16 NOTE — Evaluation (Signed)
 Occupational Therapy Evaluation Patient Details Name: Kimberly Webb MRN: [id number] DOB: Nov 26, 1932 Today's Date: 10/16/2023   History of Present Illness   Patient is a 89 yo female presenting to the ED with speech difficulties on 10/14/23. EEG showing moderate diffuse encephalopathy, CTA and CT clear, MRI also clear. Noted recent admission with failure to thrive/AKI/multiple closed rib fractures/ severe malnutrition. PMH includes OA, HTN, osteoporosis     Clinical Impressions Prior to this admission, patient at [facility] receiving rehab. Patient is confused upon OT evaluation, stating she was born in 2023, and did not know why she was here. Patient min-mod A for bed mobility, unable to come up into standing, and completing incremental scoots towards EOB with increased time and effort. Patient also with decreased awareness, not knowing her breakfast was to her immediate right and patient was fully awake upon OT entry. OT recommending return to [facility] when appropriate; will continue to follow.      If plan is discharge home, recommend the following:   A lot of help with bathing/dressing/bathroom;Assistance with cooking/housework;Assist for transportation;Direct supervision/assist for medications management;A lot of help with walking and/or transfers;Help with stairs or ramp for entrance     Functional Status Assessment   Patient has had a recent decline in their functional status and demonstrates the ability to make significant improvements in function in a reasonable and predictable amount of time.     Equipment Recommendations   Other (comment) (defer to next venue)     Recommendations for Other Services         Precautions/Restrictions   Precautions Precautions: Fall Recall of Precautions/Restrictions: Impaired Precaution/Restrictions Comments: L rib fxs Restrictions Weight Bearing Restrictions Per Provider Order: No     Mobility Bed Mobility Overal bed  mobility: Needs Assistance Bed Mobility: Supine to Sit, Sit to Supine     Supine to sit: HOB elevated, Used rails, Min assist Sit to supine: Mod assist   General bed mobility comments: Min A to come EOB, increased time, Mod A to return BLEs back into bed    Transfers Overall transfer level: Needs assistance Equipment used: Rolling walker (2 wheels) Transfers: Sit to/from Stand, Bed to chair/wheelchair/BSC Sit to Stand: +2 physical assistance, Min assist, Mod assist     Step pivot transfers: Mod assist     General transfer comment: deferred due to fatigue, willing to complete incremental scoots towards EOB      Balance Overall balance assessment: Needs assistance Sitting-balance support: No upper extremity supported, Feet supported Sitting balance-Leahy Scale: Fair     Standing balance support: Bilateral upper extremity supported Standing balance-Leahy Scale: Poor Standing balance comment: Reliant on RW                           ADL either performed or assessed with clinical judgement   ADL Overall ADL's : Needs assistance/impaired Eating/Feeding: Bed level;Set up   Grooming: Set up;Sitting   Upper Body Bathing: Minimal assistance;Sitting   Lower Body Bathing: Total assistance;Maximal assistance;Sitting/lateral leans;Sit to/from stand   Upper Body Dressing : Moderate assistance;Sitting   Lower Body Dressing: Maximal assistance;Total assistance;Sit to/from stand;Sitting/lateral leans   Toilet Transfer: Moderate assistance;+2 for physical assistance;+2 for safety/equipment Toilet Transfer Details (indicate cue type and reason): unable to come up into standing, incremental scoots completed to [facility] with increased effort Toileting- Clothing Manipulation and Hygiene: Total assistance;+2 for physical assistance;+2 for safety/equipment       Functional mobility during ADLs: Moderate  assistance;+2 for physical assistance;+2 for safety/equipment;Cueing for  sequencing;Cueing for safety;Rolling walker (2 wheels) General ADL Comments: Prior to this admission, patient at [facility] receiving rehab. Patient is confused upon OT evaluation, stating she was born in 2023, and did not know why she was here. Patient min-mod A for bed mobility, unable to come up into standing, and completing incremental scoots towards EOB with increased time and effort. Patient also with decreased awareness, not knowing her breakfast was to her immediate right and patient was fully awake upon OT entry. OT recommending return to [facility] when appropriate; will continue to follow.     Vision Baseline Vision/History: 1 Wears glasses Ability to See in Adequate Light: 0 Adequate Patient Visual Report: No change from baseline Vision Assessment?: Yes Eye Alignment: Within Functional Limits Ocular Range of Motion: Within Functional Limits Alignment/Gaze Preference: Within Defined Limits Tracking/Visual Pursuits: Able to track stimulus in all quads without difficulty Saccades: Within functional limits Convergence: Within functional limits Visual Fields: No apparent deficits     Perception Perception: Not tested       Praxis Praxis: Not tested       Pertinent Vitals/Pain Pain Assessment Pain Assessment: Faces Faces Pain Scale: Hurts a little bit Pain Location: generalized, pt unable to localize Pain Descriptors / Indicators: Discomfort, Grimacing, Guarding, Sore Pain Intervention(s): Limited activity within patient's tolerance, Monitored during session, Repositioned     Extremity/Trunk Assessment Upper Extremity Assessment Upper Extremity Assessment: Generalized weakness   Lower Extremity Assessment Lower Extremity Assessment: Defer to PT evaluation   Cervical / Trunk Assessment Cervical / Trunk Assessment: Normal   Communication Communication Communication: No apparent difficulties   Cognition Arousal: Alert Behavior During Therapy: Flat  affect Cognition: Cognition impaired   Orientation impairments: Place, Time, Situation, Person Awareness: Intellectual awareness impaired, Online awareness impaired Memory impairment (select all impairments): Short-term memory, Working memory Attention impairment (select first level of impairment): Focused attention Executive functioning impairment (select all impairments): Initiation, Reasoning, Sequencing, Organization, Problem solving OT - Cognition Comments: Patient unable to state birthdate, stating she was born in 2023, did not know why she was here, flat affect and slowed processing with all questions, unaware of breakfast though sitting right next to her                 Following commands: Impaired Following commands impaired: Follows one step commands with increased time     Cueing  General Comments   Cueing Techniques: Verbal cues;Gestural cues  VSS on RA   Exercises     Shoulder Instructions      Home Living Family/patient expects to be discharged to:: Skilled nursing facility                                 Additional Comments: Patient at [facility] receiving rehab      Prior Functioning/Environment Prior Level of Function : Needs assist             Mobility Comments: Per chart and admission on 5/1: pt lethargic; daughter stating pt typically ambulatory with RW with "short steps because of RLE pain", but over the past 10 days pt has been getting worse with mobility. Pt had to be lifted into and out of bed for the past few days, by either pt's daughter or grandson ADLs Comments: pt typically indep, handles the household finances and her medications (info from previous admission)    OT Problem List: Decreased  strength;Impaired balance (sitting and/or standing);Decreased safety awareness;Decreased knowledge of use of DME or AE;Decreased activity tolerance;Decreased cognition;Decreased coordination   OT Treatment/Interventions:  Self-care/ADL training;Therapeutic exercise;DME and/or AE instruction;Therapeutic activities;Patient/family education;Energy conservation;Cognitive remediation/compensation;Balance training      OT Goals(Current goals can be found in the care plan section)   Acute Rehab OT Goals Patient Stated Goal: unable OT Goal Formulation: Patient unable to participate in goal setting Time For Goal Achievement: 10/30/23 Potential to Achieve Goals: Good ADL Goals Pt Will Perform Lower Body Bathing: with min assist;sitting/lateral leans;sit to/from stand Pt Will Perform Lower Body Dressing: with min assist;sit to/from stand;sitting/lateral leans Pt Will Transfer to Toilet: with min assist;stand pivot transfer;bedside commode Pt Will Perform Toileting - Clothing Manipulation and hygiene: with min assist;sitting/lateral leans;sit to/from stand Additional ADL Goal #1: Patient will be able to follow 2 step commands without need for repetition or redirection in order to advance with overall cognition and independence. Additional ADL Goal #2: Patient will be able to complete bed mobility at min A as a precursor to OOB mobility.   OT Frequency:  Min 1X/week    Co-evaluation              AM-PAC OT "6 Clicks" Daily Activity     Outcome Measure Help from another person eating meals?: A Little Help from another person taking care of personal grooming?: A Little Help from another person toileting, which includes using toliet, bedpan, or urinal?: Total Help from another person bathing (including washing, rinsing, drying)?: A Lot Help from another person to put on and taking off regular upper body clothing?: A Lot Help from another person to put on and taking off regular lower body clothing?: Total 6 Click Score: 12   End of Session Nurse Communication: Mobility status  Activity Tolerance: Patient limited by fatigue Patient left: in bed;with call bell/phone within reach;with bed alarm set  OT Visit  Diagnosis: Unsteadiness on feet (R26.81);Other abnormalities of gait and mobility (R26.89);Muscle weakness (generalized) (M62.81);History of falling (Z91.81);Other symptoms and signs involving cognitive function;Adult, failure to thrive (R62.7);Pain                Time: 1610-9604 OT Time Calculation (min): 21 min Charges:  OT General Charges $OT Visit: 1 Visit OT Evaluation $OT Eval Moderate Complexity: 1 Mod  Mollie Anger E. Azana Kiesler, OTR/L Acute Rehabilitation Services [phone]   Vincent Greek 10/16/2023, 3:50 PM

## 2023-10-16 NOTE — Progress Notes (Signed)
 [facility]                                                                               Kimberly Webb, is a 89 y.o. female, DOB - 01/05/1933, UVO:[id number] Admit date - 10/14/2023    Outpatient Primary MD for the patient is Kimberly Brady, MD  LOS - 1  days    Brief summary   89 year old with a history of HTN, HLD, and hypothyroidism who presented to the ER 5/12 after she was found by staff at her SNF rehab facility with expressive aphasia and dysarthria.  Her symptoms resolved spontaneously after a short time, but then recurred when en route to the ER with EMS.  In the ER CT head was negative for acute abnormality.  CTa head and neck revealed no LVO.  Of note she was admitted to the hospital 4/30-5/6 with a UTI, metabolic encephalopathy, generalized failure to thrive, and a fall at home which resulted in multiple left-sided rib fractures.    Assessment & Plan    Assessment and Plan:      Intermittent expressive aphasia with dysarthria -multifactorial toxic and metabolic encephalopathy CT and CTa head and neck without acute findings - MRI brain with no acute abnormality - EEG without evidence of seizure activity -no evidence of CVA and symptoms not strongly suggestive of TIA -suspect this represents a multifactorial metabolic encephalopathy with perhaps a significant contribution from hypercalcemia-ammonia modestly elevated at 53 so we will follow trend after hydration -B12 suboptimal earlier this month at 340 therefore will supplement empirically -folate normal -records indicate she was started on acyclovir  5/7 but she is able to tell me she has been on this for many many years therefore I do not feel this is likely contributing   Suspected dementia Noted during recent hospital stay -  recommend  undergo outpt neuropsych testing    Hypercalcemia With albumin of 2.8 calcium corrects to 12.5 -  PTH 10/08/23 normal, suggesting possible primary hyperparathyroidism as etiology  -rule out vitamin D  deficiency  Vit d levels ordered.  - consider 24-hour urine calcium excretion  ordered.  -given advanced age and multiple comorbidities patient would be a poor surgical candidate  -consider Cinacalcet treatment after workup completed     Osteoporosis Continue Prolia  and vitamin D  supplementation   Recent multi bacterial UTI with persistent abnormal UA Repeat urine culture pending   Multiple left-sided rib fractures Pain control.  Therapy evaluations.    HTN Well controlled.    HLD Continue usual outpatient medication   Hypothyroidism Continue usual Synthroid  dose  Anemia of chronic disease Hemoglobin of 9.   Estimated body mass index is 30.24 kg/m as calculated from the following:   Height as of this encounter: 5\' 2"  (1.575 m).   Weight as of this encounter: 75 kg.  Code Status: full code.  DVT Prophylaxis:  enoxaparin  (LOVENOX ) injection 40 mg Start: 10/14/23 2130   Level of Care: Level of care: Med-Surg Family Communication: none at bedsid.e   Disposition Plan:     Remains inpatient appropriate:  pending medical work up.   Procedures:  None.   Consultants:   Neurology.   Antimicrobials:  Anti-infectives (From admission, onward)    Start     Dose/Rate Route Frequency Ordered Stop   10/15/23 1815  acyclovir  (ZOVIRAX ) tablet 400 mg        400 mg Oral Daily 10/15/23 1717          Medications  Scheduled Meds:  acyclovir   400 mg Oral Daily   amLODipine   5 mg Oral Daily   enoxaparin  (LOVENOX ) injection  40 mg Subcutaneous Q24H   levothyroxine   100 mcg Oral q morning   melatonin  6 mg Oral QHS   simvastatin   40 mg Oral QHS   thiamine   100 mg Oral Daily   Continuous Infusions:  sodium chloride  100 mL/hr at 10/16/23 1223   PRN Meds:.acetaminophen  **OR** [DISCONTINUED] acetaminophen  (TYLENOL ) oral liquid 160 mg/5 mL **OR** [DISCONTINUED] acetaminophen , senna-docusate    Subjective:   Kimberly Webb was seen and examined  today.  More alert and comfortable, answering questions appropriately.   Objective:   Vitals:   10/16/23 0700 10/16/23 0811 10/16/23 1133 10/16/23 1609  BP:  (!) 156/60 (!) 147/73 (!) 103/59  Pulse: 65 (!) 52 (!) 57 64  Resp:  18 19 18   Temp:  98.2 F (36.8 C) 98 F (36.7 C) 98.4 F (36.9 C)  TempSrc: Oral Oral Oral Oral  SpO2: 98% 94% 98% 98%  Weight:      Height:        Intake/Output Summary (Last 24 hours) at 10/16/2023 1727 Last data filed at 10/16/2023 1445 Gross per 24 hour  Intake 240 ml  Output 450 ml  Net -210 ml   Filed Weights   10/14/23 1656 10/14/23 1728  Weight: 75 kg 75 kg     Exam General: Alert and oriented x 3, NAD Cardiovascular: S1 S2 auscultated, no murmurs, RRR Respiratory: Clear to auscultation bilaterally, no wheezing, rales or rhonchi Gastrointestinal: Soft, nontender, nondistended, + bowel sounds Ext: no pedal edema bilaterally Neuro: AAOx3,  Skin: No rashes Psych: Normal affect and demeanor, alert and oriented x3    Data Reviewed:  I have personally reviewed following labs and imaging studies   CBC Lab Results  Component Value Date   WBC 9.3 10/16/2023   RBC 2.69 (L) 10/16/2023   HGB 9.0 (L) 10/16/2023   HCT 26.9 (L) 10/16/2023   MCV 100.0 10/16/2023   MCH 33.5 10/16/2023   PLT 229 10/16/2023   MCHC 33.5 10/16/2023   RDW 13.2 10/16/2023   LYMPHSABS 2.0 10/14/2023   MONOABS 1.3 (H) 10/14/2023   EOSABS 0.5 10/14/2023   BASOSABS 0.1 10/14/2023     Last metabolic panel Lab Results  Component Value Date   NA 140 10/16/2023   K 3.9 10/16/2023   CL 110 10/16/2023   CO2 22 10/16/2023   BUN 14 10/16/2023   CREATININE 1.14 (H) 10/16/2023   GLUCOSE 97 10/16/2023   GFRNONAA 46 (L) 10/16/2023   GFRAA 43 (L) 07/29/2019   CALCIUM 11.2 (H) 10/16/2023   PHOS 3.0 10/15/2023   PROT 5.9 (L) 10/16/2023   ALBUMIN 2.7 (L) 10/16/2023   BILITOT 0.5 10/16/2023   ALKPHOS 46 10/16/2023   AST 17 10/16/2023   ALT 15 10/16/2023   ANIONGAP  8 10/16/2023    CBG (last 3)  Recent Labs    10/14/23 1651  GLUCAP 111*      Coagulation Profile: Recent Labs  Lab 10/14/23 1715  INR 1.1     Radiology Studies: EEG adult Result Date: 10/15/2023 Kimberly Lack, MD  10/15/2023  8:29 AM Patient Name: Kimberly Webb MRN: [id number] Epilepsy Attending: Arleene Webb Referring Physician/Provider: Date: 10/14/2023 Duration: 21.27 mins Patient history: 89yo F with speech disturbance. EEG to evaluate for seizure Level of alertness: Awake AEDs during EEG study: None Technical aspects: This EEG study was done with scalp electrodes positioned according to the 10-20 International system of electrode placement. Electrical activity was reviewed with band pass filter of 1-70Hz , sensitivity of 7 uV/mm, display speed of 39mm/sec with a 60Hz  notched filter applied as appropriate. EEG data were recorded continuously and digitally stored.  Video monitoring was available and reviewed as appropriate. Description: EEG showed continuous generalized polymorphic 3 to 7 Hz theta-delta slowing. Hyperventilation and photic stimulation were not performed.   ABNORMALITY - Continuous slow, generalized IMPRESSION: This study is suggestive of moderate diffuse encephalopathy. No seizures or epileptiform discharges were seen throughout the recording. Kimberly Webb   MR BRAIN WO CONTRAST Result Date: 10/15/2023 CLINICAL DATA:  Acute neurologic deficit EXAM: MRI HEAD WITHOUT CONTRAST TECHNIQUE: Multiplanar, multiecho pulse sequences of the brain and surrounding structures were obtained without intravenous contrast. COMPARISON:  None Available. FINDINGS: Brain: No acute infarct, mass effect or extra-axial collection. Fewer than 5 scattered chronic microhemorrhages in a nonspecific pattern. There is multifocal hyperintense T2-weighted signal within the white matter. Generalized volume loss. The midline structures are normal. Vascular: Normal flow voids. Skull and upper  cervical spine: Normal calvarium and skull base. Visualized upper cervical spine and soft tissues are normal. Sinuses/Orbits:No paranasal sinus fluid levels or advanced mucosal thickening. No mastoid or middle ear effusion. Normal orbits. IMPRESSION: 1. No acute intracranial abnormality. 2. Findings of chronic small vessel ischemia and volume loss. Electronically Signed   By: Juanetta Nordmann M.D.   On: 10/15/2023 01:41   DG Hip Unilat W or Wo Pelvis 2-3 Views Right Result Date: 10/14/2023 CLINICAL DATA:  Hip pain code stroke EXAM: DG HIP (WITH OR WITHOUT PELVIS) 2-3V RIGHT COMPARISON:  10/02/2023 FINDINGS: No fracture or malalignment. Mild right hip degenerative change. Pubic rami appear intact IMPRESSION: No acute osseous abnormality Electronically Signed   By: Esmeralda Hedge M.D.   On: 10/14/2023 19:15   DG HIP UNILAT WITH PELVIS 2-3 VIEWS LEFT Result Date: 10/14/2023 CLINICAL DATA:  Left-sided hip pain EXAM: DG HIP (WITH OR WITHOUT PELVIS) 2-3V LEFT COMPARISON:  09/23/2023 FINDINGS: SI joints are non widened. Pubic symphysis and rami appear intact. No fracture or malalignment. Mild hip degenerative change. Vascular calcifications. IMPRESSION: Mild degenerative change. Electronically Signed   By: Esmeralda Hedge M.D.   On: 10/14/2023 19:15   DG Chest 1 View Result Date: 10/14/2023 CLINICAL DATA:  Code stroke left-sided hip pain EXAM: CHEST  1 VIEW COMPARISON:  10/02/2023 FINDINGS: No acute airspace disease. Stable cardiomediastinal silhouette with aortic atherosclerosis. No pneumothorax. Subacute mildly displaced left ninth rib fracture as before IMPRESSION: No active disease. Subacute mildly displaced left ninth rib fracture. Electronically Signed   By: Esmeralda Hedge M.D.   On: 10/14/2023 19:14       Feliciana Horn M.D. [facility] 10/16/2023, 5:27 PM  Available via Epic secure chat 7am-7pm After 7 pm, please refer to night coverage provider listed on amion.

## 2023-10-17 DIAGNOSIS — R479 Unspecified speech disturbances: Secondary | ICD-10-CM | POA: Diagnosis not present

## 2023-10-17 DIAGNOSIS — G9341 Metabolic encephalopathy: Secondary | ICD-10-CM | POA: Diagnosis not present

## 2023-10-17 DIAGNOSIS — I10 Essential (primary) hypertension: Secondary | ICD-10-CM | POA: Diagnosis not present

## 2023-10-17 LAB — BASIC METABOLIC PANEL WITH GFR
Anion gap: 7 (ref 5–15)
BUN: 13 mg/dL (ref 8–23)
CO2: 21 mmol/L — ABNORMAL LOW (ref 22–32)
Calcium: 11 mg/dL — ABNORMAL HIGH (ref 8.9–10.3)
Chloride: 111 mmol/L (ref 98–111)
Creatinine, Ser: 1.08 mg/dL — ABNORMAL HIGH (ref 0.44–1.00)
GFR, Estimated: 49 mL/min — ABNORMAL LOW (ref >60)
Glucose, Bld: 92 mg/dL (ref 70–99)
Potassium: 3.5 mmol/L (ref 3.5–5.1)
Sodium: 139 mmol/L (ref 135–145)

## 2023-10-17 LAB — CALCITRIOL (1,25 DI-OH VIT D): Vit D, 1,25-Dihydroxy: 9.8 pg/mL — ABNORMAL LOW (ref 24.8–81.5)

## 2023-10-17 LAB — PTH-RELATED PEPTIDE: PTH-related peptide: <2 pmol/L

## 2023-10-17 MED ORDER — CALCITONIN (SALMON) 200 UNIT/ML IJ SOLN
4.0000 [IU]/kg | Freq: Two times a day (BID) | INTRAMUSCULAR | Status: DC
  Administered 2023-10-17 (×2): 300 [IU] via SUBCUTANEOUS
  Filled 2023-10-17 (×4): qty 1.5

## 2023-10-17 NOTE — Progress Notes (Signed)
 [facility]                                                                               Kimberly Webb, is a 89 y.o. female, DOB - 06/05/1932, WGN:[id number] Admit date - 10/14/2023    Outpatient Primary MD for the patient is [name], MD  LOS - 2  days    Brief summary   89 year old with a history of HTN, HLD, and hypothyroidism who presented to the ER 5/12 after she was found by staff at her SNF rehab facility with expressive aphasia and dysarthria.  Her symptoms resolved spontaneously after a short time, but then recurred when en route to the ER with EMS.  In the ER CT head was negative for acute abnormality.  CTa head and neck revealed no LVO.  Of note she was admitted to the hospital 4/30-5/6 with a UTI, metabolic encephalopathy, generalized failure to thrive, and a fall at home which resulted in multiple left-sided rib fractures.    Assessment & Plan    Assessment and Plan:      Intermittent expressive aphasia with dysarthria  -multifactorial toxic and metabolic encephalopathy CT and CTa head and neck without acute findings - MRI brain with no acute abnormality - EEG without evidence of seizure activity -no evidence of CVA and symptoms not strongly suggestive of TIA -suspect this represents a multifactorial metabolic encephalopathy with perhaps a significant contribution from hypercalcemia-ammonia modestly elevated at 53 so we will follow trend after hydration -B12 suboptimal earlier this month at 340 therefore will supplement empirically -folate normal -records indicate she was started on acyclovir  5/7 but she is able to tell me she has been on this for many many years therefore I do not feel this is likely contributing   Suspected dementia Noted during recent hospital stay -  recommend  undergo outpt neuropsych testing    Hypercalcemia With albumin of 2.8 calcium corrects to 12.5 -  PTH 10/08/23 normal, suggesting possible primary hyperparathyroidism as  etiology -rule out vitamin D  deficiency  Vit d levels ordered.  - consider 24-hour urine calcium excretion  ordered.  -given advanced age and multiple comorbidities patient would be a poor surgical candidate  - calcitonin ordered.  Repeat calcium level in am.     Osteoporosis Continue Prolia  and vitamin D  supplementation   Recent multi bacterial UTI with persistent abnormal UA Repeat urine culture show multiple species.  Repeat urine cultures    Multiple left-sided rib fractures Pain control.  Therapy evaluations recommending SNF.    HTN Optimal.    Hyperlipidemia Cotninue with zocor  40 mg daily.    Hypothyroidism Continue  with 100 mcg synthroid .   Anemia of chronic disease Hemoglobin of 9.   Estimated body mass index is 30.24 kg/m as calculated from the following:   Height as of this encounter: 5\' 2"  (1.575 m).   Weight as of this encounter: 75 kg.  Code Status: full code.  DVT Prophylaxis:  enoxaparin  (LOVENOX ) injection 40 mg Start: 10/14/23 2130   Level of Care: Level of care: Med-Surg Family Communication: none at bedsid.e   Disposition Plan:     Remains inpatient appropriate:  pending medical  work up.   Procedures:  None.   Consultants:   Neurology.   Antimicrobials:   Anti-infectives (From admission, onward)    Start     Dose/Rate Route Frequency Ordered Stop   10/15/23 1815  acyclovir  (ZOVIRAX ) tablet 400 mg        400 mg Oral Daily 10/15/23 1717          Medications  Scheduled Meds:  acyclovir   400 mg Oral Daily   amLODipine   5 mg Oral Daily   calcitonin  4 Units/kg Subcutaneous BID   enoxaparin  (LOVENOX ) injection  40 mg Subcutaneous Q24H   levothyroxine   100 mcg Oral q morning   melatonin  6 mg Oral QHS   simvastatin   40 mg Oral QHS   thiamine   100 mg Oral Daily   Continuous Infusions:   PRN Meds:.acetaminophen  **OR** [DISCONTINUED] acetaminophen  (TYLENOL ) oral liquid 160 mg/5 mL **OR** [DISCONTINUED] acetaminophen ,  senna-docusate    Subjective:   Kimberly Webb was seen and examined today.  No new complaints. Had questions regarding discharge.   Objective:   Vitals:   10/17/23 0722 10/17/23 0722 10/17/23 1103 10/17/23 1538  BP: (!) 156/58 (!) 156/58 (!) 146/56 (!) 156/85  Pulse: (!) 58 (!) 58 (!) 59 69  Resp: 18 18 19 18   Temp: 98.8 F (37.1 C) 98.8 F (37.1 C) 98.3 F (36.8 C) 98.1 F (36.7 C)  TempSrc: Oral Oral Oral Oral  SpO2: 96% 97% 97% 95%  Weight:      Height:        Intake/Output Summary (Last 24 hours) at 10/17/2023 1759 Last data filed at 10/17/2023 1720 Gross per 24 hour  Intake 120 ml  Output 850 ml  Net -730 ml   Filed Weights   10/14/23 1656 10/14/23 1728  Weight: 75 kg 75 kg     Exam General exam: Appears calm and comfortable  Respiratory system: Clear to auscultation. Respiratory effort normal. Cardiovascular system: S1 & S2 heard, RRR. No JVD Gastrointestinal system: Abdomen is nondistended, soft and nontender. Central nervous system: Alert and oriented to person and place.  Extremities: Symmetric 5 x 5 power. Skin: No rashes,  Psychiatry:  Mood & affect appropriate.     Data Reviewed:  I have personally reviewed following labs and imaging studies   CBC Lab Results  Component Value Date   WBC 9.3 10/16/2023   RBC 2.69 (L) 10/16/2023   HGB 9.0 (L) 10/16/2023   HCT 26.9 (L) 10/16/2023   MCV 100.0 10/16/2023   MCH 33.5 10/16/2023   PLT 229 10/16/2023   MCHC 33.5 10/16/2023   RDW 13.2 10/16/2023   LYMPHSABS 2.0 10/14/2023   MONOABS 1.3 (H) 10/14/2023   EOSABS 0.5 10/14/2023   BASOSABS 0.1 10/14/2023     Last metabolic panel Lab Results  Component Value Date   NA 139 10/17/2023   K 3.5 10/17/2023   CL 111 10/17/2023   CO2 21 (L) 10/17/2023   BUN 13 10/17/2023   CREATININE 1.08 (H) 10/17/2023   GLUCOSE 92 10/17/2023   GFRNONAA 49 (L) 10/17/2023   GFRAA 43 (L) 07/29/2019   CALCIUM 11.0 (H) 10/17/2023   PHOS 3.0 10/15/2023   PROT 5.9  (L) 10/16/2023   ALBUMIN 2.7 (L) 10/16/2023   BILITOT 0.5 10/16/2023   ALKPHOS 46 10/16/2023   AST 17 10/16/2023   ALT 15 10/16/2023   ANIONGAP 7 10/17/2023    CBG (last 3)  No results for input(s): "GLUCAP" in the last 72 hours.  Coagulation Profile: Recent Labs  Lab 10/14/23 1715  INR 1.1     Radiology Studies: No results found.      Feliciana Horn M.D. [facility] 10/17/2023, 5:59 PM  Available via Epic secure chat 7am-7pm After 7 pm, please refer to night coverage provider listed on amion.

## 2023-10-17 NOTE — Plan of Care (Signed)
?  Problem: Education: Goal: Knowledge of disease or condition will improve Outcome: Progressing   Problem: Health Behavior/Discharge Planning: Goal: Ability to manage health-related needs will improve Outcome: Progressing   

## 2023-10-17 NOTE — Progress Notes (Signed)
 Physical Therapy Treatment Patient Details Name: COURTLYN HEARST MRN: [id number] DOB: August 26, 1932 Today's Date: 10/17/2023   History of Present Illness Patient is a 89 yo female presenting to the ED with speech difficulties on 10/14/23. EEG showing moderate diffuse encephalopathy, CTA and CT clear, MRI also clear. Noted recent admission with failure to thrive/AKI/multiple closed rib fractures/ severe malnutrition. PMH includes OA, HTN, osteoporosis    PT Comments  Pt tolerated treatment well today. Pt was able to transfer to chair with +2 Mod A via HHA. No change in DC/DME recs at this time. PT will continue to follow.     If plan is discharge home, recommend the following: Two people to help with walking and/or transfers;Two people to help with bathing/dressing/bathroom   Can travel by private vehicle     No  Equipment Recommendations  None recommended by PT    Recommendations for Other Services       Precautions / Restrictions Precautions Precautions: Fall Recall of Precautions/Restrictions: Impaired Precaution/Restrictions Comments: L rib fxs, urinary incontinence Restrictions Weight Bearing Restrictions Per Provider Order: No     Mobility  Bed Mobility Overal bed mobility: Needs Assistance Bed Mobility: Supine to Sit     Supine to sit: HOB elevated, Used rails, Min assist     General bed mobility comments: Min A to sit up    Transfers Overall transfer level: Needs assistance Equipment used: 2 person hand held assist Transfers: Sit to/from Stand, Bed to chair/wheelchair/BSC Sit to Stand: +2 physical assistance, Min assist, Mod assist   Step pivot transfers: +2 physical assistance, Mod assist       General transfer comment: +2 Mod A to step pivot to chair.    Ambulation/Gait               General Gait Details: deferred   Stairs             Wheelchair Mobility     Tilt Bed    Modified Rankin (Stroke Patients Only)       Balance  Overall balance assessment: Needs assistance Sitting-balance support: No upper extremity supported, Feet supported Sitting balance-Leahy Scale: Fair     Standing balance support: Bilateral upper extremity supported Standing balance-Leahy Scale: Poor Standing balance comment: Reliant on therapists                            Communication Communication Communication: No apparent difficulties  Cognition Arousal: Alert Behavior During Therapy: Flat affect   PT - Cognitive impairments: History of cognitive impairments                       PT - Cognition Comments: Pt A&Ox4 however noted with very flat affect. Following commands: Impaired Following commands impaired: Follows one step commands with increased time    Cueing Cueing Techniques: Verbal cues, Gestural cues  Exercises      General Comments General comments (skin integrity, edema, etc.): VSS      Pertinent Vitals/Pain Pain Assessment Pain Assessment: Faces Faces Pain Scale: No hurt    Home Living                          Prior Function            PT Goals (current goals can now be found in the care plan section) Progress towards PT goals: Progressing toward goals    Frequency  Min 2X/week      PT Plan      Co-evaluation              AM-PAC PT "6 Clicks" Mobility   Outcome Measure  Help needed turning from your back to your side while in a flat bed without using bedrails?: A Lot Help needed moving from lying on your back to sitting on the side of a flat bed without using bedrails?: Total Help needed moving to and from a bed to a chair (including a wheelchair)?: Total Help needed standing up from a chair using your arms (e.g., wheelchair or bedside chair)?: Total Help needed to walk in hospital room?: Total Help needed climbing 3-5 steps with a railing? : Total 6 Click Score: 7    End of Session Equipment Utilized During Treatment: Gait belt Activity  Tolerance: Patient tolerated treatment well Patient left: in chair;with call bell/phone within reach;with chair alarm set;with nursing/sitter in room Nurse Communication: Mobility status PT Visit Diagnosis: Other abnormalities of gait and mobility (R26.89);Muscle weakness (generalized) (M62.81);Adult, failure to thrive (R62.7)     Time: 1127-1136 PT Time Calculation (min) (ACUTE ONLY): 9 min  Charges:    $Therapeutic Activity: 8-22 mins PT General Charges $$ ACUTE PT VISIT: 1 Visit                     Mardie Kellen B, PT, DPT Acute Rehab Services [phone]    Mishael Haran 10/17/2023, 12:16 PM

## 2023-10-17 NOTE — Plan of Care (Signed)
 IV pulled out. OOB to chair. Foods and fluids well tolerated  Problem: Ischemic Stroke/TIA Tissue Perfusion: Goal: Complications of ischemic stroke/TIA will be minimized Outcome: Progressing   Problem: Self-Care: Goal: Ability to participate in self-care as condition permits will improve Outcome: Progressing   Problem: Nutrition: Goal: Risk of aspiration will decrease Outcome: Progressing   Problem: Activity: Goal: Risk for activity intolerance will decrease Outcome: Progressing   Problem: Safety: Goal: Ability to remain free from injury will improve Outcome: Progressing   Problem: Skin Integrity: Goal: Risk for impaired skin integrity will decrease Outcome: Progressing

## 2023-10-18 DIAGNOSIS — I10 Essential (primary) hypertension: Secondary | ICD-10-CM | POA: Diagnosis not present

## 2023-10-18 DIAGNOSIS — R479 Unspecified speech disturbances: Secondary | ICD-10-CM | POA: Diagnosis not present

## 2023-10-18 DIAGNOSIS — G9341 Metabolic encephalopathy: Secondary | ICD-10-CM | POA: Diagnosis not present

## 2023-10-18 LAB — BASIC METABOLIC PANEL WITH GFR
Anion gap: 10 (ref 5–15)
BUN: 11 mg/dL (ref 8–23)
CO2: 20 mmol/L — ABNORMAL LOW (ref 22–32)
Calcium: 10.2 mg/dL (ref 8.9–10.3)
Chloride: 108 mmol/L (ref 98–111)
Creatinine, Ser: 1.01 mg/dL — ABNORMAL HIGH (ref 0.44–1.00)
GFR, Estimated: 54 mL/min — ABNORMAL LOW (ref >60)
Glucose, Bld: 112 mg/dL — ABNORMAL HIGH (ref 70–99)
Potassium: 3.8 mmol/L (ref 3.5–5.1)
Sodium: 138 mmol/L (ref 135–145)

## 2023-10-18 MED ORDER — VITAMIN D (ERGOCALCIFEROL) 1.25 MG (50000 UNIT) PO CAPS
50000.0000 [IU] | ORAL_CAPSULE | ORAL | Status: DC
  Administered 2023-10-18: 50000 [IU] via ORAL
  Filled 2023-10-18: qty 1

## 2023-10-18 NOTE — Progress Notes (Signed)
24hr urine collection started at this time

## 2023-10-18 NOTE — Care Management Important Message (Signed)
 Important Message  Patient Details  Name: BEATRYCE SISTRUNK MRN: [id number] Date of Birth: 02/09/1933   Important Message Given:  Yes - Medicare IM     Wynonia Hedges 10/18/2023, 2:07 PM

## 2023-10-18 NOTE — Plan of Care (Signed)
  Problem: Education: Goal: Knowledge of disease or condition will improve Outcome: Progressing   Problem: Ischemic Stroke/TIA Tissue Perfusion: Goal: Complications of ischemic stroke/TIA will be minimized Outcome: Progressing   Problem: Health Behavior/Discharge Planning: Goal: Ability to manage health-related needs will improve Outcome: Not Progressing

## 2023-10-18 NOTE — Progress Notes (Signed)
 [facility]                                                                               Kimberly Webb, is a 89 y.o. female, DOB - 04/12/1933, MVH:[id number] Admit date - 10/14/2023    Outpatient Primary MD for the patient is [name], MD  LOS - 3  days    Brief summary   89 year old with a history of HTN, HLD, and hypothyroidism who presented to the ER 5/12 after she was found by staff at her SNF rehab facility with expressive aphasia and dysarthria.  Her symptoms resolved spontaneously after a short time, but then recurred when en route to the ER with EMS.  In the ER CT head was negative for acute abnormality.  CTa head and neck revealed no LVO.  Of note she was admitted to the hospital 4/30-5/6 with a UTI, metabolic encephalopathy, generalized failure to thrive, and a fall at home which resulted in multiple left-sided rib fractures.    Assessment & Plan    Assessment and Plan:   Intermittent expressive aphasia with dysarthria  -multifactorial toxic and metabolic encephalopathy CT and CTa head and neck without acute findings - MRI brain with no acute abnormality - EEG without evidence of seizure activity -no evidence of CVA and symptoms not strongly suggestive of TIA -suspect this represents a multifactorial metabolic encephalopathy with perhaps a significant contribution from hypercalcemia-ammonia modestly elevated at 53 so we will follow trend after hydration -B12 suboptimal earlier this month at 340 therefore will supplement empirically -folate normal -records indicate she was started on acyclovir  5/7 but she is able to tell me she has been on this for many many years therefore I do not feel this is likely contributing   Suspected dementia Noted during recent hospital stay -  recommend  undergo outpt neuropsych testing    Hypercalcemia With albumin of 2.8 calcium corrects to 12.5 -  PTH 10/08/23 normal, suggesting possible primary hyperparathyroidism as etiology .  Vit 1, 25 dihydroxy low, vit d supplementation added.  - consider 24-hour urine calcium excretion  ordered.  -given advanced age and multiple comorbidities patient would be a poor surgical candidate  - calcitonin ordered.  Repeat calcium level has improved to 10.2 today.    Osteoporosis Continue Prolia  and vitamin D  supplementation   Recent multi bacterial UTI with persistent abnormal UA Repeat urine culture show multiple species.  Repeat urine cultures    Multiple left-sided rib fractures Pain control.  Therapy evaluations recommending SNF.    HTN Well controlled. Continue with amlodipine .    Hyperlipidemia Cotninue with zocor  40 mg daily.    Hypothyroidism Continue  with 100 mcg synthroid .   Anemia of chronic disease Hemoglobin of 9.   Estimated body mass index is 30.24 kg/m as calculated from the following:   Height as of this encounter: 5\' 2"  (1.575 m).   Weight as of this encounter: 75 kg.  Code Status: full code.  DVT Prophylaxis:  enoxaparin  (LOVENOX ) injection 40 mg Start: 10/14/23 2130   Level of Care: Level of care: Med-Surg Family Communication: none at bedside   Disposition Plan:     Remains inpatient appropriate:  pending medical work up.   Procedures:  None.   Consultants:   Neurology.   Antimicrobials:   Anti-infectives (From admission, onward)    Start     Dose/Rate Route Frequency Ordered Stop   10/15/23 1815  acyclovir  (ZOVIRAX ) tablet 400 mg        400 mg Oral Daily 10/15/23 1717          Medications  Scheduled Meds:  acyclovir   400 mg Oral Daily   amLODipine   5 mg Oral Daily   enoxaparin  (LOVENOX ) injection  40 mg Subcutaneous Q24H   levothyroxine   100 mcg Oral q morning   melatonin  6 mg Oral QHS   simvastatin   40 mg Oral QHS   thiamine   100 mg Oral Daily   Continuous Infusions:   PRN Meds:.acetaminophen  **OR** [DISCONTINUED] acetaminophen  (TYLENOL ) oral liquid 160 mg/5 mL **OR** [DISCONTINUED] acetaminophen ,  senna-docusate    Subjective:   Kimberly Webb was seen and examined today. No pain. No new complaints.   Objective:   Vitals:   10/18/23 0339 10/18/23 0815 10/18/23 1017 10/18/23 1233  BP: (!) 139/51 (!) 147/64  (!) 145/62  Pulse: 63 (!) 50 62 (!) 54  Resp: 18 17    Temp: 98.6 F (37 C) 98.2 F (36.8 C)  98.2 F (36.8 C)  TempSrc: Oral Oral  Oral  SpO2: 93% 97%  97%  Weight:      Height:        Intake/Output Summary (Last 24 hours) at 10/18/2023 1256 Last data filed at 10/18/2023 1015 Gross per 24 hour  Intake 3404.43 ml  Output 1150 ml  Net 2254.43 ml   Filed Weights   10/14/23 1656 10/14/23 1728  Weight: 75 kg 75 kg     Exam General exam: Appears calm and comfortable  Respiratory system: Clear to auscultation. Respiratory effort normal. Cardiovascular system: S1 & S2 heard, RRR. No JVD,  Gastrointestinal system: Abdomen is nondistended, soft and nontender.  Central nervous system: Alert and oriented. No focal neurological deficits. Extremities: Symmetric 5 x 5 power. Skin: No rashes,  Psychiatry:  Mood & affect appropriate.      Data Reviewed:  I have personally reviewed following labs and imaging studies   CBC Lab Results  Component Value Date   WBC 9.3 10/16/2023   RBC 2.69 (L) 10/16/2023   HGB 9.0 (L) 10/16/2023   HCT 26.9 (L) 10/16/2023   MCV 100.0 10/16/2023   MCH 33.5 10/16/2023   PLT 229 10/16/2023   MCHC 33.5 10/16/2023   RDW 13.2 10/16/2023   LYMPHSABS 2.0 10/14/2023   MONOABS 1.3 (H) 10/14/2023   EOSABS 0.5 10/14/2023   BASOSABS 0.1 10/14/2023     Last metabolic panel Lab Results  Component Value Date   NA 138 10/18/2023   K 3.8 10/18/2023   CL 108 10/18/2023   CO2 20 (L) 10/18/2023   BUN 11 10/18/2023   CREATININE 1.01 (H) 10/18/2023   GLUCOSE 112 (H) 10/18/2023   GFRNONAA 54 (L) 10/18/2023   GFRAA 43 (L) 07/29/2019   CALCIUM 10.2 10/18/2023   PHOS 3.0 10/15/2023   PROT 5.9 (L) 10/16/2023   ALBUMIN 2.7 (L) 10/16/2023    BILITOT 0.5 10/16/2023   ALKPHOS 46 10/16/2023   AST 17 10/16/2023   ALT 15 10/16/2023   ANIONGAP 10 10/18/2023    CBG (last 3)  No results for input(s): "GLUCAP" in the last 72 hours.     Coagulation Profile: Recent Labs  Lab 10/14/23 1715  INR  1.1     Radiology Studies: No results found.      Feliciana Horn M.D. [facility] 10/18/2023, 12:56 PM  Available via Epic secure chat 7am-7pm After 7 pm, please refer to night coverage provider listed on amion.

## 2023-10-19 DIAGNOSIS — Z79890 Hormone replacement therapy (postmenopausal): Secondary | ICD-10-CM | POA: Diagnosis not present

## 2023-10-19 DIAGNOSIS — S2242XD Multiple fractures of ribs, left side, subsequent encounter for fracture with routine healing: Secondary | ICD-10-CM | POA: Diagnosis not present

## 2023-10-19 DIAGNOSIS — I129 Hypertensive chronic kidney disease with stage 1 through stage 4 chronic kidney disease, or unspecified chronic kidney disease: Secondary | ICD-10-CM | POA: Diagnosis not present

## 2023-10-19 DIAGNOSIS — A6009 Herpesviral infection of other urogenital tract: Secondary | ICD-10-CM | POA: Diagnosis not present

## 2023-10-19 DIAGNOSIS — I700 Atherosclerosis of aorta: Secondary | ICD-10-CM | POA: Diagnosis not present

## 2023-10-19 DIAGNOSIS — Z79899 Other long term (current) drug therapy: Secondary | ICD-10-CM | POA: Diagnosis not present

## 2023-10-19 DIAGNOSIS — D638 Anemia in other chronic diseases classified elsewhere: Secondary | ICD-10-CM | POA: Diagnosis not present

## 2023-10-19 DIAGNOSIS — G9341 Metabolic encephalopathy: Secondary | ICD-10-CM | POA: Diagnosis not present

## 2023-10-19 DIAGNOSIS — R4701 Aphasia: Secondary | ICD-10-CM | POA: Diagnosis not present

## 2023-10-19 DIAGNOSIS — M810 Age-related osteoporosis without current pathological fracture: Secondary | ICD-10-CM | POA: Diagnosis not present

## 2023-10-19 DIAGNOSIS — B9689 Other specified bacterial agents as the cause of diseases classified elsewhere: Secondary | ICD-10-CM | POA: Diagnosis not present

## 2023-10-19 DIAGNOSIS — E7800 Pure hypercholesterolemia, unspecified: Secondary | ICD-10-CM | POA: Diagnosis not present

## 2023-10-19 DIAGNOSIS — R627 Adult failure to thrive: Secondary | ICD-10-CM | POA: Diagnosis not present

## 2023-10-19 DIAGNOSIS — E860 Dehydration: Secondary | ICD-10-CM | POA: Diagnosis not present

## 2023-10-19 DIAGNOSIS — R918 Other nonspecific abnormal finding of lung field: Secondary | ICD-10-CM | POA: Diagnosis not present

## 2023-10-19 DIAGNOSIS — E43 Unspecified severe protein-calorie malnutrition: Secondary | ICD-10-CM | POA: Diagnosis not present

## 2023-10-19 DIAGNOSIS — E785 Hyperlipidemia, unspecified: Secondary | ICD-10-CM | POA: Diagnosis not present

## 2023-10-19 DIAGNOSIS — K5900 Constipation, unspecified: Secondary | ICD-10-CM | POA: Diagnosis not present

## 2023-10-19 DIAGNOSIS — E039 Hypothyroidism, unspecified: Secondary | ICD-10-CM | POA: Diagnosis not present

## 2023-10-19 DIAGNOSIS — R531 Weakness: Secondary | ICD-10-CM | POA: Diagnosis not present

## 2023-10-19 DIAGNOSIS — Z9181 History of falling: Secondary | ICD-10-CM | POA: Diagnosis not present

## 2023-10-19 DIAGNOSIS — R1312 Dysphagia, oropharyngeal phase: Secondary | ICD-10-CM | POA: Diagnosis not present

## 2023-10-19 DIAGNOSIS — Z79620 Long term (current) use of immunosuppressive biologic: Secondary | ICD-10-CM | POA: Diagnosis not present

## 2023-10-19 DIAGNOSIS — I10 Essential (primary) hypertension: Secondary | ICD-10-CM | POA: Diagnosis not present

## 2023-10-19 DIAGNOSIS — E213 Hyperparathyroidism, unspecified: Secondary | ICD-10-CM | POA: Diagnosis not present

## 2023-10-19 DIAGNOSIS — R9389 Abnormal findings on diagnostic imaging of other specified body structures: Secondary | ICD-10-CM | POA: Diagnosis not present

## 2023-10-19 DIAGNOSIS — S2232XD Fracture of one rib, left side, subsequent encounter for fracture with routine healing: Secondary | ICD-10-CM | POA: Diagnosis not present

## 2023-10-19 DIAGNOSIS — E890 Postprocedural hypothyroidism: Secondary | ICD-10-CM | POA: Diagnosis not present

## 2023-10-19 DIAGNOSIS — Z993 Dependence on wheelchair: Secondary | ICD-10-CM | POA: Diagnosis not present

## 2023-10-19 DIAGNOSIS — Z1152 Encounter for screening for COVID-19: Secondary | ICD-10-CM | POA: Diagnosis not present

## 2023-10-19 DIAGNOSIS — M1990 Unspecified osteoarthritis, unspecified site: Secondary | ICD-10-CM | POA: Diagnosis not present

## 2023-10-19 DIAGNOSIS — F0390 Unspecified dementia without behavioral disturbance: Secondary | ICD-10-CM | POA: Diagnosis not present

## 2023-10-19 DIAGNOSIS — N390 Urinary tract infection, site not specified: Secondary | ICD-10-CM | POA: Diagnosis not present

## 2023-10-19 DIAGNOSIS — B964 Proteus (mirabilis) (morganii) as the cause of diseases classified elsewhere: Secondary | ICD-10-CM | POA: Diagnosis not present

## 2023-10-19 DIAGNOSIS — Z87440 Personal history of urinary (tract) infections: Secondary | ICD-10-CM | POA: Diagnosis not present

## 2023-10-19 DIAGNOSIS — G9340 Encephalopathy, unspecified: Secondary | ICD-10-CM | POA: Diagnosis not present

## 2023-10-19 DIAGNOSIS — R479 Unspecified speech disturbances: Secondary | ICD-10-CM | POA: Diagnosis not present

## 2023-10-19 DIAGNOSIS — N179 Acute kidney failure, unspecified: Secondary | ICD-10-CM | POA: Diagnosis not present

## 2023-10-19 DIAGNOSIS — Z8619 Personal history of other infectious and parasitic diseases: Secondary | ICD-10-CM | POA: Diagnosis not present

## 2023-10-19 DIAGNOSIS — Z7401 Bed confinement status: Secondary | ICD-10-CM | POA: Diagnosis not present

## 2023-10-19 DIAGNOSIS — A6004 Herpesviral vulvovaginitis: Secondary | ICD-10-CM | POA: Diagnosis not present

## 2023-10-19 DIAGNOSIS — W19XXXD Unspecified fall, subsequent encounter: Secondary | ICD-10-CM | POA: Diagnosis present

## 2023-10-19 DIAGNOSIS — R471 Dysarthria and anarthria: Secondary | ICD-10-CM | POA: Diagnosis not present

## 2023-10-19 DIAGNOSIS — N3000 Acute cystitis without hematuria: Secondary | ICD-10-CM | POA: Diagnosis not present

## 2023-10-19 DIAGNOSIS — R0902 Hypoxemia: Secondary | ICD-10-CM | POA: Diagnosis not present

## 2023-10-19 DIAGNOSIS — E210 Primary hyperparathyroidism: Secondary | ICD-10-CM | POA: Diagnosis not present

## 2023-10-19 DIAGNOSIS — R5383 Other fatigue: Secondary | ICD-10-CM | POA: Diagnosis not present

## 2023-10-19 DIAGNOSIS — I959 Hypotension, unspecified: Secondary | ICD-10-CM | POA: Diagnosis not present

## 2023-10-19 DIAGNOSIS — E8809 Other disorders of plasma-protein metabolism, not elsewhere classified: Secondary | ICD-10-CM | POA: Diagnosis not present

## 2023-10-19 DIAGNOSIS — Z6830 Body mass index (BMI) 30.0-30.9, adult: Secondary | ICD-10-CM | POA: Diagnosis not present

## 2023-10-19 DIAGNOSIS — D649 Anemia, unspecified: Secondary | ICD-10-CM | POA: Diagnosis not present

## 2023-10-19 DIAGNOSIS — R4182 Altered mental status, unspecified: Secondary | ICD-10-CM | POA: Diagnosis not present

## 2023-10-19 DIAGNOSIS — N1831 Chronic kidney disease, stage 3a: Secondary | ICD-10-CM | POA: Diagnosis not present

## 2023-10-19 DIAGNOSIS — Z87891 Personal history of nicotine dependence: Secondary | ICD-10-CM | POA: Diagnosis not present

## 2023-10-19 LAB — BASIC METABOLIC PANEL WITH GFR
Anion gap: 11 (ref 5–15)
BUN: 14 mg/dL (ref 8–23)
CO2: 20 mmol/L — ABNORMAL LOW (ref 22–32)
Calcium: 10.3 mg/dL (ref 8.9–10.3)
Chloride: 106 mmol/L (ref 98–111)
Creatinine, Ser: 0.95 mg/dL (ref 0.44–1.00)
GFR, Estimated: 58 mL/min — ABNORMAL LOW (ref >60)
Glucose, Bld: 93 mg/dL (ref 70–99)
Potassium: 3.5 mmol/L (ref 3.5–5.1)
Sodium: 137 mmol/L (ref 135–145)

## 2023-10-19 LAB — URINE CULTURE: Culture: <10000 — AB

## 2023-10-19 MED ORDER — VITAMIN D (ERGOCALCIFEROL) 1.25 MG (50000 UNIT) PO CAPS
50000.0000 [IU] | ORAL_CAPSULE | ORAL | 0 refills | Status: DC
Start: 2023-10-25 — End: 2023-10-27

## 2023-10-19 NOTE — TOC Transition Note (Signed)
 Transition of Care [facility]) - Discharge Note   Patient Details  Name: Kimberly Webb MRN: [id number] Date of Birth: 04-Oct-1932  [facility] CM/SW Contact:  Jeffory Mings, [place] Phone Number: 10/19/2023, 11:47 AM   Clinical Narrative: Pt for dc back to [facility]. Spoke to Soy in admissions who confirmed pt can admit to room 715. Pt's dtr [name] aware of dc and reports agreeable. RN provided with number for report and PTAR arranged for transport. SW signing off at dc.   Paullette Boston, MSW, LCSW [phone] (coverage)        Final next level of care: Skilled Nursing Facility Barriers to Discharge: Barriers Resolved   Patient Goals and CMS Choice Patient states their goals for this hospitalization and ongoing recovery are:: patient unable to participate in goal setting, not fully oriented CMS Medicare.gov Compare Post Acute Care list provided to:: Patient Represenative (must comment) Choice offered to / list presented to : Adult Children [place] ownership interest in [facility].provided to:: Adult Children    Discharge Placement              Patient chooses bed at: [facility] Patient to be transferred to facility by: PT[facility] Name of family member notified: Sandy/dtr Patient and family notified of of transfer: 10/19/23  Discharge Plan and Services Additional resources added to the After Visit Summary for       Post Acute Care Choice: Skilled Nursing Facility                               Social Drivers of Health (SDOH) Interventions SDOH Screenings   Food Insecurity: No Food Insecurity (10/16/2023)  Housing: Low Risk  (10/16/2023)  Transportation Needs: No Transportation Needs (10/16/2023)  Utilities: Not At Risk (10/16/2023)  Social Connections: Moderately Integrated (10/16/2023)  Tobacco Use: Medium Risk (10/14/2023)     Readmission Risk Interventions     No data to display

## 2023-10-19 NOTE — Plan of Care (Signed)
  Problem: Education: Goal: Knowledge of disease or condition will improve Outcome: Progressing   Problem: Coping: Goal: Will verbalize positive feelings about self Outcome: Progressing   Problem: Elimination: Goal: Will not experience complications related to bowel motility Outcome: Progressing   Problem: Safety: Goal: Ability to remain free from injury will improve Outcome: Progressing

## 2023-10-19 NOTE — Discharge Summary (Signed)
 Physician Discharge Summary   Patient: Kimberly Webb MRN: [id number] DOB: 30-Oct-1932  Admit date:     10/14/2023  Discharge date: 10/19/23  Discharge Physician: Feliciana Horn   PCP: [name], MD   Recommendations at discharge:  Please follow up with PCP in one week.  Please follow up with endocrinology in one week.  Please follow up the 24 hour calcium urine.    Discharge Diagnoses: Principal Problem:   Speech abnormality Active Problems:   Hypercholesteremia   Hypothyroidism   Hypercalcemia   Essential hypertension   Acute metabolic encephalopathy    Hospital Course: 89 year old with a history of HTN, HLD, and hypothyroidism who presented to the ER 5/12 after she was found by staff at her SNF rehab facility with expressive aphasia and dysarthria.  Her symptoms resolved spontaneously after a short time, but then recurred when en route to the ER with EMS.  In the ER CT head was negative for acute abnormality.  CTa head and neck revealed no LVO.  Of note she was admitted to the hospital 4/30-5/6 with a UTI, metabolic encephalopathy, generalized failure to thrive, and a fall at home which resulted in multiple left-sided rib fractures.     Assessment and Plan:  Intermittent expressive aphasia with dysarthria  -multifactorial toxic and metabolic encephalopathy CT and CTa head and neck without acute findings - MRI brain with no acute abnormality - EEG without evidence of seizure activity -no evidence of CVA and symptoms not strongly suggestive of TIA -suspect this represents a multifactorial metabolic encephalopathy with perhaps a significant contribution from hypercalcemia-ammonia modestly elevated at 53 so we will follow trend after hydration -B12 suboptimal earlier this month at 340 therefore will supplement empirically -folate normal -records indicate she was started on acyclovir  5/7 but she is able to tell me she has been on this for many many years therefore I do not feel this  is likely contributing   Suspected dementia Noted during recent hospital stay -  recommend  undergo outpt neuropsych testing    Hypercalcemia With albumin of 2.8 calcium corrects to 12.5 -  PTH 10/08/23 normal, suggesting possible primary hyperparathyroidism as etiology . Vit 1, 25 dihydroxy low, vit d supplementation added.  - consider 24-hour urine calcium excretion  ordered.  -given advanced age and multiple comorbidities patient would be a poor surgical candidate  - calcitonin ordered.  Repeat calcium level has improved to 10.3 today.      Osteoporosis Continue Prolia  and vitamin D  supplementation   Recent multi bacterial UTI with persistent abnormal UA Repeat urine culture show multiple species.  Repeat urine cultures show insignificant growth.    Multiple left-sided rib fractures Pain control.  Therapy evaluations recommending SNF.    HTN Well controlled. Continue with amlodipine .    Hyperlipidemia Cotninue with zocor  40 mg daily.    Hypothyroidism Continue  with 100 mcg synthroid .    Anemia of chronic disease Hemoglobin of 9.    Estimated body mass index is 30.24 kg/m as calculated from the following:   Height as of this encounter: 5\' 2"  (1.575 m).   Weight as of this encounter: 75 kg.  Consultants: none.  Procedures performed: none.   Disposition: Skilled nursing facility Diet recommendation:  Discharge Diet Orders (From admission, onward)     Start     Ordered   10/19/23 0000  Diet - low sodium heart healthy        10/19/23 0856  Regular diet DISCHARGE MEDICATION: Allergies as of 10/19/2023   No Known Allergies      Medication List     STOP taking these medications    multivitamin capsule       TAKE these medications    acetaminophen  650 MG CR tablet Commonly known as: TYLENOL  Take 650 mg by mouth every 8 (eight) hours as needed for pain.   acyclovir  400 MG tablet Commonly known as: ZOVIRAX  Take 400 mg by mouth daily.    amLODipine  5 MG tablet Commonly known as: NORVASC  Take 5 mg by mouth daily.   denosumab  60 MG/ML Soln injection Commonly known as: PROLIA  Inject 60 mg into the skin every 6 (six) months. Administer in upper arm, thigh, or abdomen   GLUCOSAMINE PO Take 1 tablet by mouth daily.   levothyroxine  100 MCG tablet Commonly known as: SYNTHROID  Take 100 mcg by mouth every morning.   lidocaine  5 % Commonly known as: LIDODERM  Place 1 patch onto the skin at bedtime. Remove & Discard patch within 12 hours or as directed by MD   lisinopril  10 MG tablet Commonly known as: ZESTRIL  Take 10 mg by mouth daily.   melatonin 3 MG Tabs tablet Take 6 mg by mouth at bedtime.   methocarbamol  500 MG tablet Commonly known as: ROBAXIN  Take 1 tablet (500 mg total) by mouth every 8 (eight) hours as needed for muscle spasms (pain).   niacin  500 MG tablet Commonly known as: VITAMIN B3 Take 500 mg by mouth at bedtime.   nystatin  100000 UNIT/ML suspension Commonly known as: MYCOSTATIN  Take 4 mLs by mouth 4 (four) times daily.   ondansetron  4 MG tablet Commonly known as: ZOFRAN  Take 1 tablet (4 mg total) by mouth every 6 (six) hours as needed for nausea.   senna 8.6 MG Tabs tablet Commonly known as: SENOKOT Take 1 tablet by mouth at bedtime as needed for mild constipation.   simvastatin  40 MG tablet Commonly known as: ZOCOR  Take 40 mg by mouth at bedtime.   thiamine  100 MG tablet Commonly known as: Vitamin B-1 Take 1 tablet (100 mg total) by mouth daily.   Vitamin D  (Ergocalciferol ) 1.25 MG (50000 UNIT) Caps capsule Commonly known as: DRISDOL  Take 1 capsule (50,000 Units total) by mouth every 7 (seven) days. Start taking on: Oct 25, 2023   Zinc  Oxide 25 % Pste Apply 1 application  topically 2 (two) times daily. Apply to sacrum/peri area topically two times a day.        Follow-up Information     [name], MD. Schedule an appointment as soon as possible for a visit in 1 week(s).    Specialty: Family Medicine Contact information: [address] [place] [address] [phone]                Discharge Exam: Cleavon Curls Weights   10/14/23 1656 10/14/23 1728  Weight: 75 kg 75 kg   General exam: Appears calm and comfortable  Respiratory system: Clear to auscultation. Respiratory effort normal. Cardiovascular system: S1 & S2 heard, RRR. No JVD,  Gastrointestinal system: Abdomen is nondistended, soft and nontender. Central nervous system: Alert and oriented. No focal neurological deficits. Extremities: Symmetric 5 x 5 power. Skin: No rashes,  Psychiatry:  Mood & affect appropriate.    Condition at discharge: fair  The results of significant diagnostics from this hospitalization (including imaging, microbiology, ancillary and laboratory) are listed below for reference.   Imaging Studies: EEG adult Result Date: 10/15/2023 Yadav, Priyanka  Deanna Expose, MD     10/15/2023  8:29 AM Patient Name: TANASHIA CIESLA MRN: [id number] Epilepsy Attending: Arleene Lack Referring Physician/Provider: Date: 10/14/2023 Duration: 21.27 mins Patient history: 89yo F with speech disturbance. EEG to evaluate for seizure Level of alertness: Awake AEDs during EEG study: None Technical aspects: This EEG study was done with scalp electrodes positioned according to the 10-20 International system of electrode placement. Electrical activity was reviewed with band pass filter of 1-70Hz , sensitivity of 7 uV/mm, display speed of 92mm/sec with a 60Hz  notched filter applied as appropriate. EEG data were recorded continuously and digitally stored.  Video monitoring was available and reviewed as appropriate. Description: EEG showed continuous generalized polymorphic 3 to 7 Hz theta-delta slowing. Hyperventilation and photic stimulation were not performed.   ABNORMALITY - Continuous slow, generalized IMPRESSION: This study is suggestive of moderate diffuse encephalopathy. No seizures or epileptiform  discharges were seen throughout the recording. Arleene Lack   MR BRAIN WO CONTRAST Result Date: 10/15/2023 CLINICAL DATA:  Acute neurologic deficit EXAM: MRI HEAD WITHOUT CONTRAST TECHNIQUE: Multiplanar, multiecho pulse sequences of the brain and surrounding structures were obtained without intravenous contrast. COMPARISON:  None Available. FINDINGS: Brain: No acute infarct, mass effect or extra-axial collection. Fewer than 5 scattered chronic microhemorrhages in a nonspecific pattern. There is multifocal hyperintense T2-weighted signal within the white matter. Generalized volume loss. The midline structures are normal. Vascular: Normal flow voids. Skull and upper cervical spine: Normal calvarium and skull base. Visualized upper cervical spine and soft tissues are normal. Sinuses/Orbits:No paranasal sinus fluid levels or advanced mucosal thickening. No mastoid or middle ear effusion. Normal orbits. IMPRESSION: 1. No acute intracranial abnormality. 2. Findings of chronic small vessel ischemia and volume loss. Electronically Signed   By: Juanetta Nordmann M.D.   On: 10/15/2023 01:41   CT ANGIO HEAD NECK W WO CM (CODE STROKE) Addendum Date: 10/14/2023 ADDENDUM REPORT: 10/14/2023 19:49 ADDENDUM: Finding omitted from impression: Enlarged and heterogeneous left thyroid  lobe, extending into the mediastinum. A non-emergent thyroid  ultrasound is recommended for further evaluation. Reference: J Am Coll Radiol. 2015 Feb;12(2): 143-50. Prior right hemithyroidectomy. This addendum will be called to the ordering clinician or representative by the Radiologist Assistant, and communication documented in the PACS or [employer]. Electronically Signed   By: Bascom Lily D.O.   On: 10/14/2023 19:49   Result Date: 10/14/2023 CLINICAL DATA:  Provided history: Stroke, follow-up. EXAM: CT ANGIOGRAPHY HEAD AND NECK WITH AND WITHOUT CONTRAST TECHNIQUE: Multidetector CT imaging of the head and neck was performed using the  standard protocol during bolus administration of intravenous contrast. Multiplanar CT image reconstructions and MIPs were obtained to evaluate the vascular anatomy. Carotid stenosis measurements (when applicable) are obtained utilizing NASCET criteria, using the distal internal carotid diameter as the denominator. RADIATION DOSE REDUCTION: This exam was performed according to the departmental dose-optimization program which includes automated exposure control, adjustment of the mA and/or kV according to patient size and/or use of iterative reconstruction technique. CONTRAST:  75mL OMNIPAQUE  IOHEXOL  350 MG/ML SOLN COMPARISON:  Noncontrast head CT performed earlier today 10/14/2023. Cervical spine CT 10/02/2023. FINDINGS: CTA NECK FINDINGS Aortic arch: The left vertebral artery arises directly from the aortic arch. Atherosclerotic plaque within the visualized thoracic aorta and proximal major branch vessels of the neck. No hemodynamically significant innominate or proximal subclavian artery stenosis. Right carotid system: CCA and ICA patent within the neck without stenosis. Minimal atherosclerotic plaque within the proximal ICA. Partially retropharyngeal course of the cervical ICA. Left carotid  system: CCA and ICA patent within the neck without stenosis. Minimal atherosclerotic plaque scattered within the CCA. Vertebral arteries: The left vertebral artery arises directly from the aortic arch. Venous reflux of contrast partially obscures the left vertebral artery V1 segment. Within this limitation, the vertebral arteries are patent within the neck without stenosis. Nonstenotic atherosclerotic plaque at the right vertebral artery origin. Skeleton: Known chronic minimally displaced non-healed fracture of the C1 posterior arch on the right. Known chronic, healed oblique fracture of the C2 body on the left. Spondylosis at the cervical and visualized upper thoracic levels. No acute fracture or aggressive osseous lesion.  Other neck: Enlarged and heterogeneous left thyroid  lobe, extending into the mediastinum. Upper chest: No consolidation within the imaged lung apices. Review of the MIP images confirms the above findings CTA HEAD FINDINGS Anterior circulation: The intracranial internal carotid arteries are patent. Atherosclerotic plaque within both vessels with no more than mild stenosis. The M1 middle cerebral arteries are patent. No M2 proximal branch occlusion or high-grade proximal stenosis. The anterior cerebral arteries are patent. No intracranial aneurysm is identified. Posterior circulation: The intracranial vertebral arteries are patent. The basilar artery is patent. The posterior cerebral arteries are patent. The right PCA is fetal in origin. A small left posterior communicating artery is present. Venous sinuses: Within the limitations of contrast timing, no convincing thrombus. Anatomic variants: As described. Review of the MIP images confirms the above findings No emergent large vessel occlusion identified. These results were communicated to Dr. Bonnita Buttner at 5:36 pmon 5/12/2025by text page via the [facility] messaging system. IMPRESSION: CTA neck: 1. The common carotid and internal carotid arteries are patent within the neck without stenosis. Mild atherosclerotic plaque bilaterally, as described. 2. Venous reflux of contrast partially obscures the left vertebral artery V1 segment. Within this limitation, the vertebral arteries are patent within the neck without stenosis. Non-stenotic atherosclerotic plaque at the right vertebral artery origin. 3. Aortic Atherosclerosis (ICD10-I70.0). 4. Known chronic C1 and C2 vertebral fractures, as described. CTA head: 1. No proximal intracranial large vessel occlusion or high-grade proximal arterial stenosis identified. 2. Atherosclerotic plaque within the intracranial internal carotid arteries with no more than mild stenosis. Electronically Signed: By: Bascom Lily D.O. On: 10/14/2023 17:36    DG Hip Unilat W or Wo Pelvis 2-3 Views Right Result Date: 10/14/2023 CLINICAL DATA:  Hip pain code stroke EXAM: DG HIP (WITH OR WITHOUT PELVIS) 2-3V RIGHT COMPARISON:  10/02/2023 FINDINGS: No fracture or malalignment. Mild right hip degenerative change. Pubic rami appear intact IMPRESSION: No acute osseous abnormality Electronically Signed   By: Esmeralda Hedge M.D.   On: 10/14/2023 19:15   DG HIP UNILAT WITH PELVIS 2-3 VIEWS LEFT Result Date: 10/14/2023 CLINICAL DATA:  Left-sided hip pain EXAM: DG HIP (WITH OR WITHOUT PELVIS) 2-3V LEFT COMPARISON:  09/23/2023 FINDINGS: SI joints are non widened. Pubic symphysis and rami appear intact. No fracture or malalignment. Mild hip degenerative change. Vascular calcifications. IMPRESSION: Mild degenerative change. Electronically Signed   By: Esmeralda Hedge M.D.   On: 10/14/2023 19:15   DG Chest 1 View Result Date: 10/14/2023 CLINICAL DATA:  Code stroke left-sided hip pain EXAM: CHEST  1 VIEW COMPARISON:  10/02/2023 FINDINGS: No acute airspace disease. Stable cardiomediastinal silhouette with aortic atherosclerosis. No pneumothorax. Subacute mildly displaced left ninth rib fracture as before IMPRESSION: No active disease. Subacute mildly displaced left ninth rib fracture. Electronically Signed   By: Esmeralda Hedge M.D.   On: 10/14/2023 19:14   CT HEAD CODE STROKE WO  CONTRAST Result Date: 10/14/2023 CLINICAL DATA:  Code stroke. Neuro deficit, acute, stroke suspected. EXAM: CT HEAD WITHOUT CONTRAST TECHNIQUE: Contiguous axial images were obtained from the base of the skull through the vertex without intravenous contrast. RADIATION DOSE REDUCTION: This exam was performed according to the departmental dose-optimization program which includes automated exposure control, adjustment of the mA and/or kV according to patient size and/or use of iterative reconstruction technique. COMPARISON:  Head CT 10/02/2023. FINDINGS: Brain: Generalized cerebral atrophy. Chronic lacunar  infarcts within/about the bilateral basal ganglia and within the thalami, not appreciably changed from the prior head CT of 10/02/2023. Background moderate patchy and ill-defined hypoattenuation within the cerebral white matter, nonspecific but compatible with chronic small vessel ischemic disease. There is no acute intracranial hemorrhage. No demarcated cortical infarct. No extra-axial fluid collection. No evidence of an intracranial mass. No midline shift. Vascular: No hyperdense vessel.  Atherosclerotic calcifications. Skull: No calvarial fracture or aggressive osseous lesion. Sinuses/Orbits: No mass or acute finding within the imaged orbits. No significant paranasal sinus disease at the imaged levels. ASPECTS [facility] Stroke Program Early CT Score) - Ganglionic level infarction (caudate, lentiform nuclei, internal capsule, insula, M1-M3 cortex): 7 - Supraganglionic infarction (M4-M6 cortex): 3 Total score (0-10 with 10 being normal): 10 (when discounting chronic infarcts). No evidence of an acute intracranial abnormality. These results were communicated to Dr. Bonnita Buttner at 5:18 pmon 5/12/2025by text page via the [facility] messaging system. IMPRESSION: 1. No evidence of an acute intracranial abnormality. 2. Parenchymal atrophy, chronic small vessel ischemic disease and chronic lacunar infarcts, as described. Electronically Signed   By: Bascom Lily D.O.   On: 10/14/2023 17:19   CT CHEST ABDOMEN PELVIS WO CONTRAST Result Date: 10/02/2023 CLINICAL DATA:  Blunt polytrauma. Patient fell 10 days ago. Bilateral weakness. Patient declined from walking with a walker to being wheelchair-bound over the last 10 days. EXAM: CT CHEST, ABDOMEN AND PELVIS WITHOUT CONTRAST TECHNIQUE: Multidetector CT imaging of the chest, abdomen and pelvis was performed following the standard protocol without IV contrast. RADIATION DOSE REDUCTION: This exam was performed according to the departmental dose-optimization program which includes  automated exposure control, adjustment of the mA and/or kV according to patient size and/or use of iterative reconstruction technique. COMPARISON:  Same day chest and pelvis radiographs FINDINGS: CT CHEST FINDINGS Cardiovascular: No pericardial effusion. Coronary artery and aortic atherosclerotic calcification. Evaluation for aortic injury is limited without IV contrast. Mediastinum/Nodes: Trachea and esophagus are unremarkable. No mediastinal hematoma. Lungs/Pleura: Respiratory motion obscures detail. Hypoventilation changes in the lower lobes. Bilateral lower lobe scarring. No pleural effusion or pneumothorax. Musculoskeletal: Acute minimally displaced fractures of the left anterior 3rd-7th ribs. Displaced fracture of the left lateral ninth rib. Subacute or chronic fracture of the posterior left tenth rib. CT ABDOMEN PELVIS FINDINGS Hepatobiliary: Gallbladder sludge. No evidence of acute cholecystitis. Unremarkable noncontrast appearance of the liver. Pancreas: No acute abnormality. Spleen: Unremarkable. Adrenals/Urinary Tract: No adrenal hemorrhage or renal injury identified. Bladder is unremarkable. Stomach/Bowel: Normal caliber large and small bowel. Colonic diverticulosis without diverticulitis. No bowel wall thickening. Stomach is within normal limits. Vascular/Lymphatic: Aortic atherosclerosis. No enlarged abdominal or pelvic lymph nodes. Reproductive: Hysterectomy. Other: No free intraperitoneal fluid or air. Musculoskeletal: No acute fracture. IMPRESSION: 1. Acute minimally displaced fractures of the left anterior 3rd-7th ribs. Displaced fracture of the left lateral 9th rib. 2. No evidence of acute traumatic injury in the abdomen or pelvis. 3. Aortic Atherosclerosis (ICD10-I70.0). Electronically Signed   By: Rozell Cornet M.D.   On: 10/02/2023 19:17  CT HEAD WO CONTRAST Result Date: 10/02/2023 CLINICAL DATA:  Mental status change, persistent or worsening. EXAM: CT HEAD WITHOUT CONTRAST TECHNIQUE:  Contiguous axial images were obtained from the base of the skull through the vertex without intravenous contrast. RADIATION DOSE REDUCTION: This exam was performed according to the departmental dose-optimization program which includes automated exposure control, adjustment of the mA and/or kV according to patient size and/or use of iterative reconstruction technique. COMPARISON:  CT head without contrast 09/23/2023 at [facility] [employer]. FINDINGS: Brain: Remote lacunar infarcts are again noted within the basal ganglia and thalami bilaterally. No acute infarct, hemorrhage, or mass lesion is present. Moderate atrophy and white matter disease is stable. The ventricles are of normal size. No significant extraaxial fluid collection is present. The brainstem and cerebellum are within normal limits. Midline structures are within normal limits. Vascular: No hyperdense vessel or unexpected calcification. Skull: Calvarium is intact. No focal lytic or blastic lesions are present. No significant extracranial soft tissue lesion is present. Sinuses/Orbits: The paranasal sinuses and mastoid air cells are clear. Bilateral lens replacements are noted. Globes and orbits are otherwise unremarkable. IMPRESSION: 1. No acute intracranial abnormality or significant interval change. 2. Remote lacunar infarcts of the basal ganglia and thalami bilaterally. 3. Stable atrophy and white matter disease. This likely reflects the sequela of chronic microvascular ischemia. Electronically Signed   By: Audree Leas M.D.   On: 10/02/2023 19:05   CT Cervical Spine Wo Contrast Result Date: 10/02/2023 CLINICAL DATA:  Ataxia. Cervical trauma. Fall 10 days ago. Patient has declined from walking with a walker to now wheelchair-bound. EXAM: CT CERVICAL SPINE WITHOUT CONTRAST TECHNIQUE: Multidetector CT imaging of the cervical spine was performed without intravenous contrast. Multiplanar CT image reconstructions were also generated.  RADIATION DOSE REDUCTION: This exam was performed according to the departmental dose-optimization program which includes automated exposure control, adjustment of the mA and/or kV according to patient size and/or use of iterative reconstruction technique. COMPARISON:  CT of the cervical spine 01/11/2020 FINDINGS: Alignment: Slight degenerative anterolisthesis at C4-5 is similar to prior studies. Straightening of the normal cervical lordosis is present. Chronic leftward curvature of the lower cervical spine is noted. Skull base and vertebrae: A chronic non healed fracture is present in the right posterior arch of C1. No additional C1 fracture is present. A remote oblique healed fracture is present in the body of C2 on the left. No acute fractures are present in the cervical spine. Soft tissues and spinal canal: No prevertebral fluid or swelling. No visible canal hematoma. Right thyroidectomy is noted. Disc levels: Chronic endplate degenerative changes are greatest at C3-4, C5-6 and C6-7. Facet spurring contributes 2 bilateral foraminal stenosis at C4-5. No focal osseous lesions are present. Upper chest: The lung apices are clear. The thoracic inlet is within normal limits. IMPRESSION: 1. No acute fracture or traumatic subluxation. 2. Chronic non healed fracture in the right posterior arch of C1. 3. Remote oblique healed fracture in the body of C2 on the left. 4. Chronic degenerative changes of the cervical spine as described. Electronically Signed   By: Audree Leas M.D.   On: 10/02/2023 19:01   DG Chest Port 1 View Result Date: 10/02/2023 CLINICAL DATA:  Fall 10 days ago.  Altered mental status. EXAM: PORTABLE CHEST 1 VIEW COMPARISON:  06/24/2023. FINDINGS: Stable cardiomediastinal silhouette. Aortic atherosclerosis. No focal consolidation, sizeable pleural effusion, or appreciable pneumothorax. Mildly displaced fracture of the left lateral eighth rib, likely acute to subacute. Remote healed fracture  of  the right proximal humerus. IMPRESSION: Mildly displaced fracture of the left lateral eighth rib, likely acute to subacute. No appreciable pneumothorax. Electronically Signed   By: Mannie Seek M.D.   On: 10/02/2023 15:12   DG Pelvis Portable Result Date: 10/02/2023 CLINICAL DATA:  Fall 10 days ago with increasing weakness. EXAM: PORTABLE PELVIS 1-2 VIEWS COMPARISON:  09/23/2023 FINDINGS: Overlapping telemetry wires. No acute fracture or dislocation identified. Femoral heads are seated within the acetabula. Sacroiliac joints and pubic symphysis are anatomically aligned. Mild degenerative changes of the bilateral hips. Degenerative changes of the visualized lower lumbar spine. Vascular calcifications are noted. IMPRESSION: No acute osseous abnormality identified on single AP pelvic radiograph. Electronically Signed   By: Mannie Seek M.D.   On: 10/02/2023 15:08    Microbiology: Results for orders placed or performed during the hospital encounter of 10/14/23  Urine Culture (for pregnant, neutropenic or urologic patients or patients with an indwelling urinary catheter)     Status: Abnormal   Collection Time: 10/15/23  9:34 AM   Specimen: Urine, Clean Catch  Result Value Ref Range Status   Specimen Description URINE, CLEAN CATCH  Final   Special Requests   Final    NONE Performed at [facility], [address], [place], [place] [address]    Culture MULTIPLE SPECIES PRESENT, SUGGEST RECOLLECTION (A)  Final   Report Status 10/16/2023 FINAL  Final    Labs: CBC: Recent Labs  Lab 10/14/23 1654 10/14/23 1656 10/15/23 0605 10/16/23 0544  WBC 11.8*  --  11.6* 9.3  NEUTROABS 7.9*  --   --   --   HGB 10.6* 10.5* 9.1* 9.0*  HCT 32.5* 31.0* 27.4* 26.9*  MCV 103.2*  --  98.2 100.0  PLT 254  --  232 229   Basic Metabolic Panel: Recent Labs  Lab 10/15/23 0605 10/16/23 0544 10/17/23 0622 10/18/23 1113 10/19/23 0606  NA 137 140 139 138 137  K 4.0 3.9 3.5 3.8 3.5  CL 108 110  111 108 106  CO2 21* 22 21* 20* 20*  GLUCOSE 92 97 92 112* 93  BUN 12 14 13 11 14   CREATININE 1.05* 1.14* 1.08* 1.01* 0.95  CALCIUM 11.5* 11.2* 11.0* 10.2 10.3  PHOS 3.0  --   --   --   --    Liver Function Tests: Recent Labs  Lab 10/14/23 2141 10/15/23 0605 10/16/23 0544  AST 19  --  17  ALT 16  --  15  ALKPHOS 55  --  46  BILITOT 0.3  --  0.5  PROT 6.2*  --  5.9*  ALBUMIN 3.0* 2.8* 2.7*   CBG: Recent Labs  Lab 10/14/23 1651  GLUCAP 111*    Discharge time spent: 38 minutes.   Signed: Feliciana Horn, MD [facility] 10/19/2023

## 2023-10-21 DIAGNOSIS — A6004 Herpesviral vulvovaginitis: Secondary | ICD-10-CM | POA: Diagnosis not present

## 2023-10-21 DIAGNOSIS — K5900 Constipation, unspecified: Secondary | ICD-10-CM | POA: Diagnosis not present

## 2023-10-21 DIAGNOSIS — S2242XD Multiple fractures of ribs, left side, subsequent encounter for fracture with routine healing: Secondary | ICD-10-CM | POA: Diagnosis not present

## 2023-10-21 DIAGNOSIS — E785 Hyperlipidemia, unspecified: Secondary | ICD-10-CM | POA: Diagnosis not present

## 2023-10-21 DIAGNOSIS — I10 Essential (primary) hypertension: Secondary | ICD-10-CM | POA: Diagnosis not present

## 2023-10-21 DIAGNOSIS — R4701 Aphasia: Secondary | ICD-10-CM | POA: Diagnosis not present

## 2023-10-21 LAB — CALCIUM, URINE, 24 HOUR
Calcium, 24 hour urine: 315 mg/(24.h) — ABNORMAL HIGH (ref 0–320)
Calcium, Ur: 12.1 mg/dL
Total Volume: 2600

## 2023-10-24 DIAGNOSIS — R4701 Aphasia: Secondary | ICD-10-CM | POA: Diagnosis not present

## 2023-10-24 DIAGNOSIS — K5900 Constipation, unspecified: Secondary | ICD-10-CM | POA: Diagnosis not present

## 2023-10-24 DIAGNOSIS — S2242XD Multiple fractures of ribs, left side, subsequent encounter for fracture with routine healing: Secondary | ICD-10-CM | POA: Diagnosis not present

## 2023-10-24 DIAGNOSIS — A6004 Herpesviral vulvovaginitis: Secondary | ICD-10-CM | POA: Diagnosis not present

## 2023-10-24 DIAGNOSIS — I10 Essential (primary) hypertension: Secondary | ICD-10-CM | POA: Diagnosis not present

## 2023-10-24 DIAGNOSIS — E785 Hyperlipidemia, unspecified: Secondary | ICD-10-CM | POA: Diagnosis not present

## 2023-10-26 ENCOUNTER — Other Ambulatory Visit: Payer: Self-pay

## 2023-10-26 ENCOUNTER — Encounter (HOSPITAL_COMMUNITY): Payer: Self-pay

## 2023-10-26 ENCOUNTER — Inpatient Hospital Stay (HOSPITAL_COMMUNITY)
Admission: EM | Admit: 2023-10-26 | Discharge: 2023-10-30 | DRG: 643 | Disposition: A | Discharge status: Skilled Nursing Facility | Source: Skilled Nursing Facility | Attending: Internal Medicine | Admitting: Internal Medicine

## 2023-10-26 ENCOUNTER — Emergency Department (HOSPITAL_COMMUNITY)

## 2023-10-26 DIAGNOSIS — R627 Adult failure to thrive: Secondary | ICD-10-CM | POA: Diagnosis not present

## 2023-10-26 DIAGNOSIS — W19XXXD Unspecified fall, subsequent encounter: Secondary | ICD-10-CM | POA: Diagnosis present

## 2023-10-26 DIAGNOSIS — N1831 Chronic kidney disease, stage 3a: Secondary | ICD-10-CM | POA: Diagnosis not present

## 2023-10-26 DIAGNOSIS — N179 Acute kidney failure, unspecified: Secondary | ICD-10-CM | POA: Diagnosis present

## 2023-10-26 DIAGNOSIS — Z87891 Personal history of nicotine dependence: Secondary | ICD-10-CM

## 2023-10-26 DIAGNOSIS — E860 Dehydration: Secondary | ICD-10-CM | POA: Diagnosis not present

## 2023-10-26 DIAGNOSIS — E210 Primary hyperparathyroidism: Secondary | ICD-10-CM | POA: Diagnosis not present

## 2023-10-26 DIAGNOSIS — Z9181 History of falling: Secondary | ICD-10-CM | POA: Diagnosis not present

## 2023-10-26 DIAGNOSIS — E890 Postprocedural hypothyroidism: Secondary | ICD-10-CM | POA: Diagnosis present

## 2023-10-26 DIAGNOSIS — F0390 Unspecified dementia without behavioral disturbance: Secondary | ICD-10-CM | POA: Diagnosis not present

## 2023-10-26 DIAGNOSIS — R918 Other nonspecific abnormal finding of lung field: Secondary | ICD-10-CM | POA: Diagnosis not present

## 2023-10-26 DIAGNOSIS — B964 Proteus (mirabilis) (morganii) as the cause of diseases classified elsewhere: Secondary | ICD-10-CM | POA: Diagnosis present

## 2023-10-26 DIAGNOSIS — Z7401 Bed confinement status: Secondary | ICD-10-CM | POA: Diagnosis not present

## 2023-10-26 DIAGNOSIS — M1990 Unspecified osteoarthritis, unspecified site: Secondary | ICD-10-CM | POA: Diagnosis present

## 2023-10-26 DIAGNOSIS — Z79620 Long term (current) use of immunosuppressive biologic: Secondary | ICD-10-CM

## 2023-10-26 DIAGNOSIS — G9340 Encephalopathy, unspecified: Secondary | ICD-10-CM | POA: Diagnosis present

## 2023-10-26 DIAGNOSIS — I10 Essential (primary) hypertension: Secondary | ICD-10-CM | POA: Diagnosis not present

## 2023-10-26 DIAGNOSIS — Z993 Dependence on wheelchair: Secondary | ICD-10-CM

## 2023-10-26 DIAGNOSIS — R531 Weakness: Secondary | ICD-10-CM

## 2023-10-26 DIAGNOSIS — N390 Urinary tract infection, site not specified: Secondary | ICD-10-CM | POA: Diagnosis present

## 2023-10-26 DIAGNOSIS — N3000 Acute cystitis without hematuria: Secondary | ICD-10-CM | POA: Diagnosis not present

## 2023-10-26 DIAGNOSIS — R1312 Dysphagia, oropharyngeal phase: Secondary | ICD-10-CM | POA: Diagnosis present

## 2023-10-26 DIAGNOSIS — R9389 Abnormal findings on diagnostic imaging of other specified body structures: Secondary | ICD-10-CM | POA: Diagnosis not present

## 2023-10-26 DIAGNOSIS — R4182 Altered mental status, unspecified: Secondary | ICD-10-CM | POA: Diagnosis present

## 2023-10-26 DIAGNOSIS — M810 Age-related osteoporosis without current pathological fracture: Secondary | ICD-10-CM | POA: Diagnosis not present

## 2023-10-26 DIAGNOSIS — Z6830 Body mass index (BMI) 30.0-30.9, adult: Secondary | ICD-10-CM | POA: Diagnosis not present

## 2023-10-26 DIAGNOSIS — Z79890 Hormone replacement therapy (postmenopausal): Secondary | ICD-10-CM | POA: Diagnosis not present

## 2023-10-26 DIAGNOSIS — S2242XD Multiple fractures of ribs, left side, subsequent encounter for fracture with routine healing: Secondary | ICD-10-CM | POA: Diagnosis not present

## 2023-10-26 DIAGNOSIS — G9341 Metabolic encephalopathy: Secondary | ICD-10-CM | POA: Diagnosis present

## 2023-10-26 DIAGNOSIS — Z1152 Encounter for screening for COVID-19: Secondary | ICD-10-CM

## 2023-10-26 DIAGNOSIS — D638 Anemia in other chronic diseases classified elsewhere: Secondary | ICD-10-CM | POA: Diagnosis present

## 2023-10-26 DIAGNOSIS — S2232XD Fracture of one rib, left side, subsequent encounter for fracture with routine healing: Secondary | ICD-10-CM

## 2023-10-26 DIAGNOSIS — Z87440 Personal history of urinary (tract) infections: Secondary | ICD-10-CM

## 2023-10-26 DIAGNOSIS — E8809 Other disorders of plasma-protein metabolism, not elsewhere classified: Secondary | ICD-10-CM | POA: Diagnosis present

## 2023-10-26 DIAGNOSIS — A6009 Herpesviral infection of other urogenital tract: Secondary | ICD-10-CM | POA: Diagnosis present

## 2023-10-26 DIAGNOSIS — E43 Unspecified severe protein-calorie malnutrition: Secondary | ICD-10-CM | POA: Diagnosis present

## 2023-10-26 DIAGNOSIS — D649 Anemia, unspecified: Secondary | ICD-10-CM | POA: Diagnosis present

## 2023-10-26 DIAGNOSIS — E039 Hypothyroidism, unspecified: Secondary | ICD-10-CM | POA: Diagnosis not present

## 2023-10-26 DIAGNOSIS — Z8619 Personal history of other infectious and parasitic diseases: Secondary | ICD-10-CM | POA: Diagnosis not present

## 2023-10-26 DIAGNOSIS — I700 Atherosclerosis of aorta: Secondary | ICD-10-CM | POA: Diagnosis not present

## 2023-10-26 DIAGNOSIS — R471 Dysarthria and anarthria: Secondary | ICD-10-CM | POA: Diagnosis present

## 2023-10-26 DIAGNOSIS — Z79899 Other long term (current) drug therapy: Secondary | ICD-10-CM

## 2023-10-26 DIAGNOSIS — R609 Edema, unspecified: Secondary | ICD-10-CM | POA: Diagnosis not present

## 2023-10-26 DIAGNOSIS — R4701 Aphasia: Secondary | ICD-10-CM | POA: Diagnosis present

## 2023-10-26 DIAGNOSIS — E7800 Pure hypercholesterolemia, unspecified: Secondary | ICD-10-CM | POA: Diagnosis not present

## 2023-10-26 DIAGNOSIS — E785 Hyperlipidemia, unspecified: Secondary | ICD-10-CM | POA: Diagnosis present

## 2023-10-26 DIAGNOSIS — E213 Hyperparathyroidism, unspecified: Secondary | ICD-10-CM | POA: Diagnosis not present

## 2023-10-26 DIAGNOSIS — I129 Hypertensive chronic kidney disease with stage 1 through stage 4 chronic kidney disease, or unspecified chronic kidney disease: Secondary | ICD-10-CM | POA: Diagnosis not present

## 2023-10-26 DIAGNOSIS — R5383 Other fatigue: Secondary | ICD-10-CM | POA: Diagnosis present

## 2023-10-26 DIAGNOSIS — R0902 Hypoxemia: Secondary | ICD-10-CM | POA: Diagnosis not present

## 2023-10-26 DIAGNOSIS — B9689 Other specified bacterial agents as the cause of diseases classified elsewhere: Secondary | ICD-10-CM | POA: Diagnosis present

## 2023-10-26 LAB — PROCALCITONIN: Procalcitonin: <0.1 ng/mL

## 2023-10-26 LAB — RESP PANEL BY RT-PCR (RSV, FLU A&B, COVID)  RVPGX2
Influenza A by PCR: NEGATIVE
Influenza B by PCR: NEGATIVE
Resp Syncytial Virus by PCR: NEGATIVE
SARS Coronavirus 2 by RT PCR: NEGATIVE

## 2023-10-26 LAB — URINALYSIS, ROUTINE W REFLEX MICROSCOPIC
Bilirubin Urine: NEGATIVE
Glucose, UA: NEGATIVE mg/dL
Hgb urine dipstick: NEGATIVE
Ketones, ur: NEGATIVE mg/dL
Nitrite: POSITIVE — AB
Protein, ur: NEGATIVE mg/dL
Specific Gravity, Urine: 1.02 (ref 1.005–1.030)
pH: 6 (ref 5.0–8.0)

## 2023-10-26 LAB — RETICULOCYTES
Immature Retic Fract: 12.5 % (ref 2.3–15.9)
RBC.: 3.06 MIL/uL — ABNORMAL LOW (ref 3.87–5.11)
Retic Count, Absolute: 49.9 10*3/uL (ref 19.0–186.0)
Retic Ct Pct: 1.6 % (ref 0.4–3.1)

## 2023-10-26 LAB — COMPREHENSIVE METABOLIC PANEL WITH GFR
ALT: 14 U/L (ref 0–44)
AST: 21 U/L (ref 15–41)
Albumin: 3.4 g/dL — ABNORMAL LOW (ref 3.5–5.0)
Alkaline Phosphatase: 63 U/L (ref 38–126)
Anion gap: 9 (ref 5–15)
BUN: 20 mg/dL (ref 8–23)
CO2: 22 mmol/L (ref 22–32)
Calcium: 13.3 mg/dL (ref 8.9–10.3)
Chloride: 108 mmol/L (ref 98–111)
Creatinine, Ser: 1.28 mg/dL — ABNORMAL HIGH (ref 0.44–1.00)
GFR, Estimated: 40 mL/min — ABNORMAL LOW (ref >60)
Glucose, Bld: 102 mg/dL — ABNORMAL HIGH (ref 70–99)
Potassium: 4.2 mmol/L (ref 3.5–5.1)
Sodium: 139 mmol/L (ref 135–145)
Total Bilirubin: 0.5 mg/dL (ref 0.0–1.2)
Total Protein: 6.6 g/dL (ref 6.5–8.1)

## 2023-10-26 LAB — BLOOD GAS, VENOUS
Acid-Base Excess: 1.5 mmol/L (ref 0.0–2.0)
Bicarbonate: 27.7 mmol/L (ref 20.0–28.0)
O2 Saturation: 56 %
Patient temperature: 36.6
pCO2, Ven: 48 mmHg (ref 44–60)
pH, Ven: 7.37 (ref 7.25–7.43)
pO2, Ven: <31 mmHg — CL (ref 32–45)

## 2023-10-26 LAB — FERRITIN: Ferritin: 262 ng/mL (ref 11–307)

## 2023-10-26 LAB — CBC
HCT: 31.4 % — ABNORMAL LOW (ref 36.0–46.0)
Hemoglobin: 10 g/dL — ABNORMAL LOW (ref 12.0–15.0)
MCH: 32.7 pg (ref 26.0–34.0)
MCHC: 31.8 g/dL (ref 30.0–36.0)
MCV: 102.6 fL — ABNORMAL HIGH (ref 80.0–100.0)
Platelets: 288 10*3/uL (ref 150–400)
RBC: 3.06 MIL/uL — ABNORMAL LOW (ref 3.87–5.11)
RDW: 12.7 % (ref 11.5–15.5)
WBC: 8.8 10*3/uL (ref 4.0–10.5)
nRBC: 0 % (ref 0.0–0.2)

## 2023-10-26 LAB — AMMONIA: Ammonia: 19 umol/L (ref 9–35)

## 2023-10-26 LAB — TROPONIN I (HIGH SENSITIVITY)
Troponin I (High Sensitivity): 12 ng/L (ref <18)
Troponin I (High Sensitivity): 11 ng/L (ref <18)

## 2023-10-26 LAB — IRON AND TIBC
Iron: 30 ug/dL (ref 28–170)
Saturation Ratios: 11 % (ref 10.4–31.8)
TIBC: 287 ug/dL (ref 250–450)
UIBC: 257 ug/dL

## 2023-10-26 LAB — MAGNESIUM: Magnesium: 1.7 mg/dL (ref 1.7–2.4)

## 2023-10-26 LAB — URINALYSIS, MICROSCOPIC (REFLEX)

## 2023-10-26 LAB — TSH: TSH: 1.661 u[IU]/mL (ref 0.350–4.500)

## 2023-10-26 LAB — OSMOLALITY: Osmolality: 305 mosm/kg — ABNORMAL HIGH (ref 275–295)

## 2023-10-26 LAB — PHOSPHORUS: Phosphorus: 3.4 mg/dL (ref 2.5–4.6)

## 2023-10-26 LAB — CBG MONITORING, ED: Glucose-Capillary: 107 mg/dL — ABNORMAL HIGH (ref 70–99)

## 2023-10-26 LAB — CK: Total CK: 14 U/L — ABNORMAL LOW (ref 38–234)

## 2023-10-26 LAB — T4, FREE: Free T4: 0.98 ng/dL (ref 0.61–1.12)

## 2023-10-26 MED ORDER — SODIUM CHLORIDE 0.9 % IV SOLN
1.0000 g | INTRAVENOUS | Status: DC
  Administered 2023-10-27 – 2023-10-29 (×3): 1 g via INTRAVENOUS
  Filled 2023-10-26 (×3): qty 10

## 2023-10-26 MED ORDER — CALCITONIN (SALMON) 200 UNIT/ML IJ SOLN
4.0000 [IU]/kg | Freq: Once | INTRAMUSCULAR | Status: AC
  Administered 2023-10-26: 300 [IU] via INTRAMUSCULAR
  Filled 2023-10-26: qty 1.5

## 2023-10-26 MED ORDER — SODIUM CHLORIDE 0.9 % IV SOLN
1.0000 g | Freq: Once | INTRAVENOUS | Status: AC
  Administered 2023-10-26: 1 g via INTRAVENOUS
  Filled 2023-10-26: qty 10

## 2023-10-26 MED ORDER — SODIUM CHLORIDE 0.9 % IV BOLUS
2000.0000 mL | Freq: Once | INTRAVENOUS | Status: AC
  Administered 2023-10-26: 2000 mL via INTRAVENOUS

## 2023-10-26 MED ORDER — SODIUM CHLORIDE 0.9 % IV BOLUS: 500.0000 mL | Freq: Once | INTRAVENOUS | Status: DC

## 2023-10-26 NOTE — Assessment & Plan Note (Signed)
Patient on acyclovir.

## 2023-10-26 NOTE — Assessment & Plan Note (Signed)
Will rehydrate and follow renal function  °

## 2023-10-26 NOTE — Assessment & Plan Note (Signed)
-   Check TSH continue home medications Synthroid at 100 mcg po q day  

## 2023-10-26 NOTE — Assessment & Plan Note (Signed)
 Recurrent patient has undergone recent workup felt secondary to primary parathyroidism, and patient patient not a candidate for surgical intervention was treated with calcitonin we will repeat

## 2023-10-26 NOTE — H&P (Signed)
 Kimberly Webb FAO:[id number] DOB: 1932/07/24 DOA: 10/26/2023     PCP: [name], MD      Patient arrived to ER on 10/26/23 at 1737 Referred by Attending Kommor, Alyse July, MD   Patient coming from:    From facility      Chief Complaint:   Chief Complaint  Patient presents with   Fatigue    HPI: Kimberly Webb is a 89 y.o. female with medical history significant of HLD HTN hypothyroidism hypercalcemia possible dementia, osteoporosis, recurrent UTI, multiple rib fractures, anemia of chronic disease  Presented with   fatigue Patient currently resides at SNF presented today with fatigue starting this morning  Patient had a fall at home end of April that had 3rd  7th and ninth left lateral rib fractures metabolic encephalopathy secondary to UTI and hypercalcemia Urine at that time showed E. coli Proteus in our coccus patient was treated and discharged to SNF she came back again on 12 May with altered mental status and status changes in her speech Workup including MRI CTA was unremarkable EEG without evidence of seizure activity patient improved thought to have maybe underlying dementia She has persistent hypercalcemia but noted to have low albumin felt that at that time her hypercalcemia corrected to 12.5 PTH was checked and was normal Felt to be secondary to primary hyperparathyroidism She has low vitamin D  and vitamin D  supplementation added patient found to be a poor surgical candidate She was treated with calcitonin and her calcium level improved down to 10.3 she was able to be discharged to SNF     Denies significant ETOH intake   Does not smoke       Regarding pertinent Chronic problems:    Hyperlipidemia - on statins Zocor  Lipid Panel     Component Value Date/Time   CHOL 107 10/15/2023 0605   TRIG 67 10/15/2023 0605   HDL 34 (L) 10/15/2023 0605   CHOLHDL 3.1 10/15/2023 0605   VLDL 13 10/15/2023 0605   LDLCALC 60 10/15/2023 0605     HTN on Norvasc ,  lisinopril       Hypothyroidism:   Lab Results  Component Value Date   TSH 1.569 10/02/2023   on synthroid     Dementia - not on meds    Chronic anemia - baseline hg Hemoglobin & Hematocrit  Recent Labs    10/15/23 0605 10/16/23 0544 10/26/23 1751  HGB 9.1* 9.0* 10.0*   Iron/TIBC/Ferritin/ %Sat    Component Value Date/Time   IRON 38 10/07/2023 0723   TIBC 202 (L) 10/07/2023 0723   FERRITIN 329 (H) 10/07/2023 0723   IRONPCTSAT 19 10/07/2023 0723      While in ER:       Lab Orders         Resp panel by RT-PCR (RSV, Flu A&B, Covid) Anterior Nasal Swab         Urine Culture         Comprehensive metabolic panel         CBC         Urinalysis, Routine w reflex microscopic -Urine, Clean Catch         TSH         T4, free         Urinalysis, Microscopic (reflex)         Calcium, ionized         PTH, intact and calcium         CBG monitoring, ED  CBG monitoring, ED       CXR -  Retrocardiac atelectasis or infiltrates.     Following Medications were ordered in ER: Medications  sodium chloride  0.9 % bolus 2,000 mL (has no administration in time range)  cefTRIAXone  (ROCEPHIN ) 1 g in sodium chloride  0.9 % 100 mL IVPB (1 g Intravenous New Bag/Given 10/26/23 1856)     ED Triage Vitals  Encounter Vitals Group     BP 10/26/23 1741 (!) 154/69     Systolic BP Percentile --      Diastolic BP Percentile --      Pulse Rate 10/26/23 1741 70     Resp 10/26/23 1741 14     Temp 10/26/23 1741 98.5 F (36.9 C)     Temp Source 10/26/23 1741 Oral     SpO2 10/26/23 1741 96 %     Weight 10/26/23 1816 165 lb 5.5 oz (75 kg)     Height 10/26/23 1816 5\' 2"  (1.575 m)     Head Circumference --      Peak Flow --      Pain Score 10/26/23 1816 0     Pain Loc --      Pain Education --      Exclude from Growth Chart --   MWNU(27)@     _________________________________________ Significant initial  Findings: Abnormal Labs Reviewed  COMPREHENSIVE METABOLIC PANEL WITH GFR -  Abnormal; Notable for the following components:      Result Value   Glucose, Bld 102 (*)    Creatinine, Ser 1.28 (*)    Calcium 13.3 (*)    Albumin 3.4 (*)    GFR, Estimated 40 (*)    All other components within normal limits  CBC - Abnormal; Notable for the following components:   RBC 3.06 (*)    Hemoglobin 10.0 (*)    HCT 31.4 (*)    MCV 102.6 (*)    All other components within normal limits  URINALYSIS, ROUTINE W REFLEX MICROSCOPIC - Abnormal; Notable for the following components:   APPearance HAZY (*)    Nitrite POSITIVE (*)    Leukocytes,Ua SMALL (*)    All other components within normal limits  URINALYSIS, MICROSCOPIC (REFLEX) - Abnormal; Notable for the following components:   Bacteria, UA MANY (*)    All other components within normal limits  CBG MONITORING, ED - Abnormal; Notable for the following components:   Glucose-Capillary 107 (*)    All other components within normal limits      ECG: Ordered Personally reviewed and interpreted by me showing: HR : 65 Rhythm:Sinus rhythm Borderline prolonged PR interval Low voltage, precordial leads QTC 381   The recent clinical data is shown below. Vitals:   10/26/23 1741 10/26/23 1816  BP: (!) 154/69   Pulse: 70   Resp: 14   Temp: 98.5 F (36.9 C)   TempSrc: Oral   SpO2: 96%   Weight:  75 kg  Height:  5\' 2"  (1.575 m)    WBC     Component Value Date/Time   WBC 8.8 10/26/2023 1751   LYMPHSABS 2.0 10/14/2023 1654   MONOABS 1.3 (H) 10/14/2023 1654   EOSABS 0.5 10/14/2023 1654   BASOSABS 0.1 10/14/2023 1654       Procalcitonin   Ordered      UA ? evidence of UTI    Urine analysis:    Component Value Date/Time   COLORURINE YELLOW 10/26/2023 1801   APPEARANCEUR HAZY (A) 10/26/2023 1801  LABSPEC 1.020 10/26/2023 1801   PHURINE 6.0 10/26/2023 1801   GLUCOSEU NEGATIVE 10/26/2023 1801   HGBUR NEGATIVE 10/26/2023 1801   BILIRUBINUR NEGATIVE 10/26/2023 1801   KETONESUR NEGATIVE 10/26/2023 1801    PROTEINUR NEGATIVE 10/26/2023 1801   UROBILINOGEN 0.2 10/31/2012 1238   NITRITE POSITIVE (A) 10/26/2023 1801   LEUKOCYTESUR SMALL (A) 10/26/2023 1801    Results for orders placed or performed during the hospital encounter of 10/26/23  Resp panel by RT-PCR (RSV, Flu A&B, Covid) Anterior Nasal Swab     Status: None   Collection Time: 10/26/23  6:17 PM   Specimen: Anterior Nasal Swab  Result Value Ref Range Status   SARS Coronavirus 2 by RT PCR NEGATIVE NEGATIVE Final   Influenza A by PCR NEGATIVE NEGATIVE Final   Influenza B by PCR NEGATIVE NEGATIVE Final         Resp Syncytial Virus by PCR NEGATIVE NEGATIVE Final          ABX started Antibiotics Given (last 72 hours)     Date/Time Action Medication Dose Rate   10/26/23 1856 New Bag/Given   cefTRIAXone  (ROCEPHIN ) 1 g in sodium chloride  0.9 % 100 mL IVPB 1 g 200 mL/hr        Susceptibility data from last 90 days. Collected Specimen Info Organism AMPICILLIN AMPICILLIN/SULBACTAM CEFAZOLIN CEFEPIME CEFTRIAXONE  Ciprofloxacin  Gentamicin Susc lslt Imipenem Nitrofurantoin Susc lslt Piperacillin + Tazobactam Trimethoprim/Sulfa  10/05/23 Urine, Catheterized Escherichia coli  S  S  S  S  S  R  S  S  S  S  S    Proteus mirabilis  S  S  S  S  S  S  S  S  R  S  S     ___VBG pending   __________________________________________________________ Recent Labs  Lab 10/26/23 1751  NA 139  K 4.2  CO2 22  GLUCOSE 102*  BUN 20  CREATININE 1.28*  CALCIUM 13.3*    Cr  Up from baseline see below Lab Results  Component Value Date   CREATININE 1.28 (H) 10/26/2023   CREATININE 0.95 10/19/2023   CREATININE 1.01 (H) 10/18/2023    Recent Labs  Lab 10/26/23 1751  AST 21  ALT 14  ALKPHOS 63  BILITOT 0.5  PROT 6.6  ALBUMIN 3.4*   Lab Results  Component Value Date   CALCIUM 13.3 (HH) 10/26/2023   PHOS 3.0 10/15/2023       Plt: Lab Results  Component Value Date   PLT 288 10/26/2023         Recent Labs  Lab 10/26/23 1751   WBC 8.8  HGB 10.0*  HCT 31.4*  MCV 102.6*  PLT 288    HG/HCT   stable,     Component Value Date/Time   HGB 10.0 (L) 10/26/2023 1751   HCT 31.4 (L) 10/26/2023 1751   MCV 102.6 (H) 10/26/2023 1751    ____________________________ Hospitalist was called for admission for   Hypercalcemia    Acute cystitis without hematuria     The following Work up has been ordered so far:  Orders Placed This Encounter  Procedures   Resp panel by RT-PCR (RSV, Flu A&B, Covid) Anterior Nasal Swab   Urine Culture   DG Chest 2 View   Comprehensive metabolic panel   CBC   Urinalysis, Routine w reflex microscopic -Urine, Clean Catch   TSH   T4, free   Urinalysis, Microscopic (reflex)   Calcium, ionized   PTH, intact and calcium  Diet NPO time specified   Document Height and Actual Weight   Consult to hospitalist   CBG monitoring, ED   CBG monitoring, ED   ED EKG   EKG 12-Lead     OTHER Significant initial  Findings:  labs showing:     DM  labs:  HbA1C: Recent Labs    10/15/23 0605  HGBA1C 5.9*       CBG (last 3)  Recent Labs    10/26/23 1749  GLUCAP 107*          Cultures:    Component Value Date/Time   SDES URINE, CLEAN CATCH 10/18/2023 0115   SPECREQUEST NONE 10/18/2023 0115   CULT (A) 10/18/2023 0115    <10,000 COLONIES/mL INSIGNIFICANT GROWTH Performed at [facility], [address], [place], [place] [address]    REPTSTATUS 10/19/2023 FINAL 10/18/2023 0115     Radiological Exams on Admission: DG Chest 2 View Result Date: 10/26/2023 CLINICAL DATA:  Fatigue since this morning.  Not as active as usual. EXAM: CHEST - 2 VIEW COMPARISON:  Radiograph 10/14/2023 FINDINGS: Stable cardiomediastinal silhouette. Aortic atherosclerotic calcification. Retrocardiac atelectasis or infiltrates. No pleural effusion or pneumothorax. Redemonstrated mildly displaced left ninth rib fracture. IMPRESSION: Retrocardiac atelectasis or infiltrates. Electronically Signed   By:  Rozell Cornet M.D.   On: 10/26/2023 19:12   _______________________________________________________________________________________________________ Latest  Blood pressure (!) 154/69, pulse 70, temperature 98.5 F (36.9 C), temperature source Oral, resp. rate 14, height 5\' 2"  (1.575 m), weight 75 kg, SpO2 96%.   Vitals  labs and radiology finding personally reviewed  Review of Systems:    Pertinent positives include:  confusion fatigue,  Constitutional:  No weight loss, night sweats, Fevers, chills,  weight loss  HEENT:  No headaches, Difficulty swallowing,Tooth/dental problems,Sore throat,  No sneezing, itching, ear ache, nasal congestion, post nasal drip,  Cardio-vascular:  No chest pain, Orthopnea, PND, anasarca, dizziness, palpitations.no Bilateral lower extremity swelling  GI:  No heartburn, indigestion, abdominal pain, nausea, vomiting, diarrhea, change in bowel habits, loss of appetite, melena, blood in stool, hematemesis Resp:  no shortness of breath at rest. No dyspnea on exertion, No excess mucus, no productive cough, No non-productive cough, No coughing up of blood.No change in color of mucus.No wheezing. Skin:  no rash or lesions. No jaundice GU:  no dysuria, change in color of urine, no urgency or frequency. No straining to urinate.  No flank pain.  Musculoskeletal:  No joint pain or no joint swelling. No decreased range of motion. No back pain.  Psych:  No change in mood or affect. No depression or anxiety. No memory loss.  Neuro: no localizing neurological complaints, no tingling, no weakness, no double vision, no gait abnormality, no slurred speech, no   All systems reviewed and apart from HOPI all are negative _______________________________________________________________________________________________ Past Medical History:   Past Medical History:  Diagnosis Date   Arthritis    Atrophic vaginitis    Broken collarbone    Broken neck (HCC)    Broken ribs     Cystocele    Elevated serum creatinine    decreased GFR   Fibroid    Hypercholesteremia    Hypertension    Hypothyroidism    Osteoporosis    Paratubal cyst    Rectocele    STD (sexually transmitted disease)    HSV   Uterine prolapse       Past Surgical History:  Procedure Laterality Date   BROKEN ANKLE     BROKEN FEMUR  CATARACT EXTRACTION Bilateral    FOOT SURGERY Right    TOE   HERNIA REPAIR  2009   OOPHORECTOMY     BSO WITH VAG HYST IN 2004   REPLACEMENT TOTAL KNEE  2011   LEFT X 2   THYROIDECTOMY, PARTIAL     VAGINAL HYSTERECTOMY  2004   VAG HYST, BSO, A&P REPAIR    Social History:  Ambulatory  wheelchair bound     reports that she has quit smoking. She has never used smokeless tobacco. She reports that she does not drink alcohol and does not use drugs.    Family History:   Family History  Problem Relation Age of Onset   Heart disease Mother    Hypertension Father    Hypertension Sister    Leukemia Sister    Heart disease Brother    Multiple sclerosis Brother    Parkinsonism Brother    ______________________________________________________________________________________________ Allergies: No Known Allergies   Prior to Admission medications   Medication Sig Start Date End Date Taking? Authorizing Provider  acetaminophen  (TYLENOL ) 650 MG CR tablet Take 650 mg by mouth every 8 (eight) hours as needed for pain.    [provider]  acyclovir  (ZOVIRAX ) 400 MG tablet Take 400 mg by mouth daily.    [provider]  amLODipine  (NORVASC ) 5 MG tablet Take 5 mg by mouth daily.    [provider]  denosumab  (PROLIA ) 60 MG/ML SOLN injection Inject 60 mg into the skin every 6 (six) months. Administer in upper arm, thigh, or abdomen    [provider]  Glucosamine HCl (GLUCOSAMINE PO) Take 1 tablet by mouth daily.    [provider]  levothyroxine  (SYNTHROID ) 100 MCG tablet Take 100 mcg by mouth every morning.  07/21/23   [provider]  lidocaine  (LIDODERM ) 5 % Place 1 patch onto the skin at bedtime. Remove & Discard patch within 12 hours or as directed by MD 10/08/23   Sheikh, Omair Latif, DO  lisinopril  (ZESTRIL ) 10 MG tablet Take 10 mg by mouth daily. 05/08/19   [provider]  melatonin 3 MG TABS tablet Take 6 mg by mouth at bedtime.    [provider]  methocarbamol  (ROBAXIN ) 500 MG tablet Take 1 tablet (500 mg total) by mouth every 8 (eight) hours as needed for muscle spasms (pain). 10/08/23   Sheikh, Omair Latif, DO  niacin  500 MG tablet Take 500 mg by mouth at bedtime.    [provider]  nystatin  (MYCOSTATIN ) 100000 UNIT/ML suspension Take 4 mLs by mouth 4 (four) times daily.    [provider]  ondansetron  (ZOFRAN ) 4 MG tablet Take 1 tablet (4 mg total) by mouth every 6 (six) hours as needed for nausea. 10/08/23   Sheikh, Omair Latif, DO  senna (SENOKOT) 8.6 MG TABS tablet Take 1 tablet by mouth at bedtime as needed for mild constipation.    [provider]  simvastatin  (ZOCOR ) 40 MG tablet Take 40 mg by mouth at bedtime.    [provider]  thiamine  (VITAMIN B-1) 100 MG tablet Take 1 tablet (100 mg total) by mouth daily. 10/09/23   Sheikh, Jonel Nephew Latif, DO  Vitamin D , Ergocalciferol , (DRISDOL ) 1.25 MG (50000 UNIT) CAPS capsule Take 1 capsule (50,000 Units total) by mouth every 7 (seven) days. 10/25/23   Akula, Vijaya, MD  Zinc  Oxide 25 % PSTE Apply 1 application  topically 2 (two) times daily. Apply to sacrum/peri area topically two times a day.    [provider]    ___________________________________________________________________________________________________ Physical Exam:    10/26/2023    6:16 PM 10/26/2023    5:41 PM 10/19/2023    9:03 AM  Vitals with BMI  Height 5\' 2"     Weight 165 lbs 6 oz    BMI 30.23    Systolic  154 154  Diastolic  69 66  Pulse  70 63     1. General:  in No  Acute distress   Chronically ill   -appearing 2. Psychological: Alert and   Oriented 3. Head/ENT:    Dry Mucous Membranes                          Head Non traumatic, neck supple                          Poor Dentition 4. SKIN:  decreased Skin turgor,  Skin clean Dry and intact no rash    5. Heart: Regular rate and rhythm no  Murmur, no Rub or gallop 6. Lungs:  no wheezes or crackles   7. Abdomen: Soft,  suprapubic-tender, Non distended  bowel sounds present 8. Lower extremities: no clubbing, cyanosis, edema right>left 9. Neurologically Grossly intact, moving all 4 extremities equally   10. MSK: Normal range of motion    Chart has been reviewed  ______________________________________________________________________________________________  Assessment/Plan 89 y.o. female with medical history significant of HLD HTN hypothyroidism hypercalcemia possible dementia, osteoporosis, recurrent UTI, multiple rib fractures, anemia of chronic disease  Admitted for   Hypercalcemia    Acute cystitis without hematuria     Present on Admission:  Hypercalcemia  AKI (acute kidney injury) (HCC)  Essential hypertension  History of herpes genitalis  Hypercholesteremia  Hypothyroidism  Protein-calorie malnutrition, severe  Acute metabolic encephalopathy  Abnormal CXR  UTI (urinary tract infection)     AKI (acute kidney injury) (HCC) Will rehydrate and follow renal function  Essential hypertension Hold Lisinopril  given AKI, can restart NOrvasc  5mg  po q day  Generalized weakness PT OT evaluation prior to discharge  History of herpes genitalis Patient on acyclovir   Hypercalcemia Recurrent patient has undergone recent workup felt secondary to primary parathyroidism, and patient patient not a candidate for surgical intervention was treated with calcitonin we will repeat  Hypercholesteremia Continue Zocor  40 mg a day  Hypothyroidism - Check TSH continue home medications Synthroid  at 100 mcg po q  day   Protein-calorie malnutrition, severe Make sure he has nutritional consult ordered  Acute metabolic encephalopathy   - most likely multifactorial secondary to combination of  infection  dehydration secondary to decreased by mouth intake,  Hypercalcemia   - Will rehydrate   - treat underlining infection   - Hold contributing medications   - if no improvement may need further imaging to evaluate for CNS pathology pathology such as MRI of the brain   - neurological exam appears to be nonfocal but patient unable to cooperate fully  Will correct hypercalcemia     Abnormal CXR Patient denies any cough or shortness of breath at this time. May need repeat imaging to reevaluate She has been ordered Rocephin  for possible UTI Check procalcitonin we may help in distinguishing the patient as she has pulmonary infection source clinically appears to be less likely  UTI (urinary tract infection) Possible UTI.  Urine culture pending.  Patient given a dose of Rocephin  continue for now until able to determine   Other plan  as per orders.  DVT prophylaxis:  SCD    Code Status:    Code Status: Prior FULL CODE  as per patient   I had personally discussed CODE STATUS with patient   ACP   none  Family Communication:   Family not at  Bedside    Diet  Diet Orders (From admission, onward)     Start     Ordered   10/26/23 1751  Diet NPO time specified  Diet effective now        10/26/23 1751            Disposition Plan:      Back to current facility when stable                             Following barriers for discharge:                                                       Electrolytes corrected                                  Consult Orders  (From admission, onward)           Start     Ordered   10/26/23 1942  Consult to hospitalist  Paged by Fredrik Jensen  Once       Provider:  (Not yet assigned)  Question Answer Comment  Place call to: [facility]   Reason for  Consult Admit      10/26/23 1941                               Would benefit from PT/OT eval prior to DC  Ordered                                       Consults called: none  Admission status:  ED Disposition     ED Disposition  Admit   Condition  --   Comment  Hospital Area: [facility] [[id number]]  Level of Care: Telemetry Surgical [[id number]]  May admit patient to [facility] or [facility] if equivalent level of care is available:: No  Covid Evaluation: Asymptomatic - no recent exposure (last 10 days) testing not required  Diagnosis: Hypercalcemia [275.42.ICD-9-CM]  Admitting Physician: Dezmen Alcock [[id number]]  Attending Physician: Andrw Mcguirt [[id number]]  Certification:: I certify this patient will need inpatient services for at least 2 midnights  Expected Medical Readiness: 10/29/2023               inpatient     I Expect 2 midnight stay secondary to severity of patient's current illness need for inpatient interventions justified by the following:     Severe lab/radiological/exam abnormalities including:    Hypercalcemia  Acute cystitis without hematuria    and extensive comorbidities including: CKD  That are currently affecting medical management.   I expect  patient to be hospitalized for 2 midnights requiring inpatient medical care.  Patient is at high risk for adverse outcome (such as loss of life or disability) if not  treated.  Indication for inpatient stay as follows:  Severe change from baseline regarding mental status   Need for   IV fluids     Level of care    tele  For  24H    Irine Heminger 10/26/2023, 9:15 PM    [facility]     after 2 AM please page floor coverage   If 7AM-7PM, please contact the day team taking care of the patient using [email or web address]

## 2023-10-26 NOTE — Assessment & Plan Note (Addendum)
-   most likely multifactorial secondary to combination of  infection  dehydration secondary to decreased by mouth intake,  Hypercalcemia   - Will rehydrate   - treat underlining infection   - Hold contributing medications   - if no improvement may need further imaging to evaluate for CNS pathology pathology such as MRI of the brain   - neurological exam appears to be nonfocal but patient unable to cooperate fully  Will correct hypercalcemia

## 2023-10-26 NOTE — Assessment & Plan Note (Signed)
 Make sure he has nutritional consult ordered

## 2023-10-26 NOTE — ED Triage Notes (Signed)
 Pt sent from SNF with c/o fatigue since this morning. Pt not as active as usual. Pt denies CP SOB, pain. A&OX4 baseline

## 2023-10-26 NOTE — ED Provider Notes (Signed)
 [place] EMERGENCY DEPARTMENT AT [facility] Provider Note   CSN: [id number] Arrival date & time: 10/26/23  1737     History  Chief Complaint  Patient presents with   Fatigue    Kimberly Webb is a 89 y.o. female, hx of hypothyroidism, who presents to the ED 2/2 to increased fatigue and sluggishness, today.  Has not been as active per nursing home report, states that the patient has been more sleepy today.  Patient denies any chest pain, shortness of breath, upper respiratory infectious symptoms, urinary symptoms, abdominal pain, or any pain of the extremities.  Has not any new rashes.  Has been compliant with medications.    Home Medications Prior to Admission medications   Medication Sig Start Date End Date Taking? Authorizing Provider  acetaminophen  (TYLENOL ) 650 MG CR tablet Take 650 mg by mouth every 8 (eight) hours as needed for pain.    [provider]  acyclovir  (ZOVIRAX ) 400 MG tablet Take 400 mg by mouth daily.    [provider]  amLODipine  (NORVASC ) 5 MG tablet Take 5 mg by mouth daily.    [provider]  denosumab  (PROLIA ) 60 MG/ML SOLN injection Inject 60 mg into the skin every 6 (six) months. Administer in upper arm, thigh, or abdomen    [provider]  Glucosamine HCl (GLUCOSAMINE PO) Take 1 tablet by mouth daily.    [provider]  levothyroxine  (SYNTHROID ) 100 MCG tablet Take 100 mcg by mouth every morning. 07/21/23   [provider]  lidocaine  (LIDODERM ) 5 % Place 1 patch onto the skin at bedtime. Remove & Discard patch within 12 hours or as directed by MD 10/08/23   Sheikh, Omair Latif, DO  lisinopril  (ZESTRIL ) 10 MG tablet Take 10 mg by mouth daily. 05/08/19   [provider]  melatonin 3 MG TABS tablet Take 6 mg by mouth at bedtime.    [provider]  methocarbamol  (ROBAXIN ) 500 MG tablet Take 1 tablet (500 mg total) by mouth every 8 (eight) hours as needed for muscle spasms  (pain). 10/08/23   Sheikh, Omair Latif, DO  niacin  500 MG tablet Take 500 mg by mouth at bedtime.    [provider]  nystatin  (MYCOSTATIN ) 100000 UNIT/ML suspension Take 4 mLs by mouth 4 (four) times daily.    [provider]  ondansetron  (ZOFRAN ) 4 MG tablet Take 1 tablet (4 mg total) by mouth every 6 (six) hours as needed for nausea. 10/08/23   Sheikh, Omair Latif, DO  senna (SENOKOT) 8.6 MG TABS tablet Take 1 tablet by mouth at bedtime as needed for mild constipation.    [provider]  simvastatin  (ZOCOR ) 40 MG tablet Take 40 mg by mouth at bedtime.    [provider]  thiamine  (VITAMIN B-1) 100 MG tablet Take 1 tablet (100 mg total) by mouth daily. 10/09/23   Sheikh, Jonel Nephew Latif, DO  Vitamin D , Ergocalciferol , (DRISDOL ) 1.25 MG (50000 UNIT) CAPS capsule Take 1 capsule (50,000 Units total) by mouth every 7 (seven) days. 10/25/23   Akula, Vijaya, MD  Zinc  Oxide 25 % PSTE Apply 1 application  topically 2 (two) times daily. Apply to sacrum/peri area topically two times a day.    [provider]      Allergies    Patient has no known allergies.    Review of Systems   Review of Systems  Respiratory:  Negative for cough and shortness of breath.   Cardiovascular:  Negative for  chest pain.    Physical Exam Updated Vital Signs BP (!) 174/83 (BP Location: Left Arm)   Pulse 66   Temp 98.5 F (36.9 C) (Oral)   Resp 16   Ht 5\' 2"  (1.575 m)   Wt 75 kg   SpO2 99%   BMI 30.24 kg/m  Physical Exam Vitals and nursing note reviewed.  Constitutional:      General: She is not in acute distress.    Appearance: She is well-developed.  HENT:     Head: Normocephalic and atraumatic.  Eyes:     Conjunctiva/sclera: Conjunctivae normal.  Cardiovascular:     Rate and Rhythm: Normal rate and regular rhythm.     Heart sounds: No murmur heard. Pulmonary:     Effort: Pulmonary effort is normal. No respiratory distress.     Breath sounds: Normal breath sounds.   Abdominal:     Palpations: Abdomen is soft.     Tenderness: There is no abdominal tenderness.  Musculoskeletal:        General: No swelling.     Cervical back: Neck supple.  Skin:    General: Skin is warm and dry.     Capillary Refill: Capillary refill takes less than 2 seconds.  Neurological:     General: No focal deficit present.     Mental Status: She is alert and oriented to person, place, and time.  Psychiatric:        Mood and Affect: Mood normal.     ED Results / Procedures / Treatments   Labs (all labs ordered are listed, but only abnormal results are displayed) Labs Reviewed  COMPREHENSIVE METABOLIC PANEL WITH GFR - Abnormal; Notable for the following components:      Result Value   Glucose, Bld 102 (*)    Creatinine, Ser 1.28 (*)    Calcium 13.3 (*)    Albumin 3.4 (*)    GFR, Estimated 40 (*)    All other components within normal limits  CBC - Abnormal; Notable for the following components:   RBC 3.06 (*)    Hemoglobin 10.0 (*)    HCT 31.4 (*)    MCV 102.6 (*)    All other components within normal limits  URINALYSIS, ROUTINE W REFLEX MICROSCOPIC - Abnormal; Notable for the following components:   APPearance HAZY (*)    Nitrite POSITIVE (*)    Leukocytes,Ua Kennadee Walthour (*)    All other components within normal limits  URINALYSIS, MICROSCOPIC (REFLEX) - Abnormal; Notable for the following components:   Bacteria, UA MANY (*)    All other components within normal limits  RETICULOCYTES - Abnormal; Notable for the following components:   RBC. 3.06 (*)    All other components within normal limits  CK - Abnormal; Notable for the following components:   Total CK 14 (*)    All other components within normal limits  CBG MONITORING, ED - Abnormal; Notable for the following components:   Glucose-Capillary 107 (*)    All other components within normal limits  RESP PANEL BY RT-PCR (RSV, FLU A&B, COVID)  RVPGX2  URINE CULTURE  TSH  T4, FREE  MAGNESIUM   PHOSPHORUS   PROCALCITONIN  CALCIUM, IONIZED  VITAMIN B12  FOLATE  IRON AND TIBC  FERRITIN  CREATININE, URINE, RANDOM  OSMOLALITY, URINE  OSMOLALITY  SODIUM, URINE, RANDOM  T3  BASIC METABOLIC PANEL WITH GFR  BASIC METABOLIC PANEL WITH GFR  BLOOD GAS, VENOUS  AMMONIA  CBG MONITORING, ED  TROPONIN I (HIGH SENSITIVITY)  TROPONIN I (HIGH SENSITIVITY)    EKG None  Radiology DG Chest 2 View Result Date: 10/26/2023 CLINICAL DATA:  Fatigue since this morning.  Not as active as usual. EXAM: CHEST - 2 VIEW COMPARISON:  Radiograph 10/14/2023 FINDINGS: Stable cardiomediastinal silhouette. Aortic atherosclerotic calcification. Retrocardiac atelectasis or infiltrates. No pleural effusion or pneumothorax. Redemonstrated mildly displaced left ninth rib fracture. IMPRESSION: Retrocardiac atelectasis or infiltrates. Electronically Signed   By: Rozell Cornet M.D.   On: 10/26/2023 19:12    Procedures Procedures    Medications Ordered in ED Medications  cefTRIAXone  (ROCEPHIN ) 1 g in sodium chloride  0.9 % 100 mL IVPB (has no administration in time range)  calcitonin (MIACALCIN ) injection 300 Units (has no administration in time range)  cefTRIAXone  (ROCEPHIN ) 1 g in sodium chloride  0.9 % 100 mL IVPB (0 g Intravenous Stopped 10/26/23 2026)  sodium chloride  0.9 % bolus 2,000 mL (2,000 mLs Intravenous New Bag/Given 10/26/23 2044)    ED Course/ Medical Decision Making/ A&P                                 Medical Decision Making Attempted to get a hold of [facility], without any success, per nursing staff, report from them, states that patient has been more fatigued today, she endorses this.  She has no complaints however just feels more fatigued.  Has not had any upper respiratory symptoms, chest pain, or shortness of breath.  There is a plethora of reasons, for her fatigue, thus we will obtain blood work and COVID/flu testing for any viral etiology, urinalysis to check for  urinary tract infection, chest x-ray to check for pneumonia.  Is overall well-appearing, troponin ordered secondary to rule out any ACS causing fatigue  Amount and/or Complexity of Data Reviewed Labs: ordered.    Details: Hypercalcemia 13.3, nitrate positive, with many bacteria in urine Radiology: ordered.    Details: Chest x-ray shows retrocardiac atelectasis or infiltrates Discussion of management or test interpretation with external provider(s): Patient symptoms, align with hypercalcemia, she also did have a nitrate positive UTI, with possible infiltrates of the chest, I think it is unlikely to be pneumonia however we will continue treating UTI with ceftriaxone  which will cover for both.  Primarily admitted for hypercalcemia, which I think is likely causing the weakness, given 2 L of normal saline, in the ED, and admitted to Dr. Hendrick Locke for further management.  History of hypercalcemia, on Prolia  last calcium was around 11, a few days ago  Risk Decision regarding hospitalization.    Final Clinical Impression(s) / ED Diagnoses Final diagnoses:  Hypercalcemia  Acute cystitis without hematuria    Rx / DC Orders ED Discharge Orders     None         Glendoris Nodarse, Dwaine Gip, PA 10/26/23 2150    Karlyn Overman, MD 10/27/23 [phone]

## 2023-10-26 NOTE — Subjective & Objective (Signed)
 Patient currently resides at SNF presented today with fatigue starting this morning  Patient had a fall at home end of April that had 3rd  7th and ninth left lateral rib fractures metabolic encephalopathy secondary to UTI and hypercalcemia Urine at that time showed E. coli Proteus in our coccus patient was treated and discharged to SNF she came back again on 12 May with altered mental status and status changes in her speech Workup including MRI CTA was unremarkable EEG without evidence of seizure activity patient improved thought to have maybe underlying dementia She has persistent hypercalcemia but noted to have low albumin felt that at that time her hypercalcemia corrected to 12.5 PTH was checked and was normal Felt to be secondary to primary hyperparathyroidism She has low vitamin D  and vitamin D  supplementation added patient found to be a poor surgical candidate She was treated with calcitonin and her calcium level improved down to 10.3 she was able to be discharged to [facility]

## 2023-10-26 NOTE — Assessment & Plan Note (Signed)
 Patient denies any cough or shortness of breath at this time. May need repeat imaging to reevaluate She has been ordered Rocephin  for possible UTI Check procalcitonin we may help in distinguishing the patient as she has pulmonary infection source clinically appears to be less likely

## 2023-10-26 NOTE — Assessment & Plan Note (Signed)
 Hold Lisinopril  given AKI, can restart NOrvasc  5mg  po q day

## 2023-10-26 NOTE — Assessment & Plan Note (Signed)
 Possible UTI.  Urine culture pending.  Patient given a dose of Rocephin  continue for now until able to determine

## 2023-10-26 NOTE — Assessment & Plan Note (Signed)
PT OT evaluation prior to discharge 

## 2023-10-26 NOTE — Assessment & Plan Note (Signed)
Continue Zocor 40 mg a day.

## 2023-10-27 LAB — COMPREHENSIVE METABOLIC PANEL WITH GFR
ALT: 13 U/L (ref 0–44)
AST: 15 U/L (ref 15–41)
Albumin: 3.3 g/dL — ABNORMAL LOW (ref 3.5–5.0)
Alkaline Phosphatase: 66 U/L (ref 38–126)
Anion gap: 7 (ref 5–15)
BUN: 16 mg/dL (ref 8–23)
CO2: 23 mmol/L (ref 22–32)
Calcium: 11.7 mg/dL — ABNORMAL HIGH (ref 8.9–10.3)
Chloride: 111 mmol/L (ref 98–111)
Creatinine, Ser: 1.16 mg/dL — ABNORMAL HIGH (ref 0.44–1.00)
GFR, Estimated: 45 mL/min — ABNORMAL LOW (ref >60)
Glucose, Bld: 114 mg/dL — ABNORMAL HIGH (ref 70–99)
Potassium: 4.2 mmol/L (ref 3.5–5.1)
Sodium: 141 mmol/L (ref 135–145)
Total Bilirubin: 0.3 mg/dL (ref 0.0–1.2)
Total Protein: 6.5 g/dL (ref 6.5–8.1)

## 2023-10-27 LAB — BASIC METABOLIC PANEL WITH GFR
Anion gap: 5 (ref 5–15)
Anion gap: 10 (ref 5–15)
BUN: 17 mg/dL (ref 8–23)
BUN: 16 mg/dL (ref 8–23)
CO2: 24 mmol/L (ref 22–32)
CO2: 22 mmol/L (ref 22–32)
Calcium: 12.4 mg/dL — ABNORMAL HIGH (ref 8.9–10.3)
Calcium: 11.2 mg/dL — ABNORMAL HIGH (ref 8.9–10.3)
Chloride: 111 mmol/L (ref 98–111)
Chloride: 108 mmol/L (ref 98–111)
Creatinine, Ser: 1.18 mg/dL — ABNORMAL HIGH (ref 0.44–1.00)
Creatinine, Ser: 1.17 mg/dL — ABNORMAL HIGH (ref 0.44–1.00)
GFR, Estimated: 44 mL/min — ABNORMAL LOW (ref >60)
GFR, Estimated: 45 mL/min — ABNORMAL LOW (ref >60)
Glucose, Bld: 112 mg/dL — ABNORMAL HIGH (ref 70–99)
Glucose, Bld: 109 mg/dL — ABNORMAL HIGH (ref 70–99)
Potassium: 3.9 mmol/L (ref 3.5–5.1)
Potassium: 4.1 mmol/L (ref 3.5–5.1)
Sodium: 140 mmol/L (ref 135–145)
Sodium: 140 mmol/L (ref 135–145)

## 2023-10-27 LAB — CBC
HCT: 30.3 % — ABNORMAL LOW (ref 36.0–46.0)
Hemoglobin: 9.8 g/dL — ABNORMAL LOW (ref 12.0–15.0)
MCH: 32.3 pg (ref 26.0–34.0)
MCHC: 32.3 g/dL (ref 30.0–36.0)
MCV: 100 fL (ref 80.0–100.0)
Platelets: 288 10*3/uL (ref 150–400)
RBC: 3.03 MIL/uL — ABNORMAL LOW (ref 3.87–5.11)
RDW: 12.5 % (ref 11.5–15.5)
WBC: 8.9 10*3/uL (ref 4.0–10.5)
nRBC: 0 % (ref 0.0–0.2)

## 2023-10-27 LAB — OSMOLALITY, URINE: Osmolality, Ur: 358 mosm/kg (ref 300–900)

## 2023-10-27 LAB — PHOSPHORUS: Phosphorus: 2.4 mg/dL — ABNORMAL LOW (ref 2.5–4.6)

## 2023-10-27 LAB — VITAMIN B12: Vitamin B-12: 688 pg/mL (ref 180–914)

## 2023-10-27 LAB — FOLATE: Folate: 26.7 ng/mL (ref >5.9)

## 2023-10-27 LAB — CREATININE, URINE, RANDOM: Creatinine, Urine: 34 mg/dL

## 2023-10-27 LAB — SODIUM, URINE, RANDOM: Sodium, Ur: 118 mmol/L

## 2023-10-27 MED ORDER — CALCITONIN (SALMON) 200 UNIT/ML IJ SOLN
4.0000 [IU]/kg | Freq: Once | INTRAMUSCULAR | Status: AC
  Administered 2023-10-27: 300 [IU] via INTRAMUSCULAR
  Filled 2023-10-27: qty 1.5

## 2023-10-27 MED ORDER — ONDANSETRON HCL 4 MG/2ML IJ SOLN: 4.0000 mg | Freq: Four times a day (QID) | INTRAMUSCULAR | Status: DC | PRN

## 2023-10-27 MED ORDER — ACETAMINOPHEN 325 MG PO TABS
650.0000 mg | ORAL_TABLET | Freq: Four times a day (QID) | ORAL | Status: DC | PRN
  Administered 2023-10-29 – 2023-10-30 (×3): 650 mg via ORAL
  Filled 2023-10-27 (×3): qty 2

## 2023-10-27 MED ORDER — ACETAMINOPHEN 650 MG RE SUPP: 650.0000 mg | Freq: Four times a day (QID) | RECTAL | Status: DC | PRN

## 2023-10-27 MED ORDER — DOCUSATE SODIUM 100 MG PO CAPS
100.0000 mg | ORAL_CAPSULE | Freq: Two times a day (BID) | ORAL | Status: DC
  Administered 2023-10-27 – 2023-10-30 (×7): 100 mg via ORAL
  Filled 2023-10-27 (×7): qty 1

## 2023-10-27 MED ORDER — AMLODIPINE BESYLATE 5 MG PO TABS
5.0000 mg | ORAL_TABLET | Freq: Every day | ORAL | Status: DC
  Administered 2023-10-27 – 2023-10-29 (×3): 5 mg via ORAL
  Filled 2023-10-27 (×3): qty 1

## 2023-10-27 MED ORDER — SIMVASTATIN 20 MG PO TABS: 40.0000 mg | ORAL_TABLET | Freq: Every day | ORAL | Status: DC

## 2023-10-27 MED ORDER — POLYETHYLENE GLYCOL 3350 17 G PO PACK: 17.0000 g | PACK | Freq: Every day | ORAL | Status: DC | PRN

## 2023-10-27 MED ORDER — SODIUM CHLORIDE 0.9 % IV SOLN: INTRAVENOUS | Status: AC

## 2023-10-27 MED ORDER — ONDANSETRON HCL 4 MG PO TABS: 4.0000 mg | ORAL_TABLET | Freq: Four times a day (QID) | ORAL | Status: DC | PRN

## 2023-10-27 MED ORDER — SENNA 8.6 MG PO TABS
1.0000 | ORAL_TABLET | Freq: Two times a day (BID) | ORAL | Status: DC
  Administered 2023-10-27 – 2023-10-30 (×7): 8.6 mg via ORAL
  Filled 2023-10-27 (×7): qty 1

## 2023-10-27 MED ORDER — ATORVASTATIN CALCIUM 10 MG PO TABS
20.0000 mg | ORAL_TABLET | Freq: Every day | ORAL | Status: DC
  Administered 2023-10-27 – 2023-10-29 (×3): 20 mg via ORAL
  Filled 2023-10-27 (×3): qty 2

## 2023-10-27 MED ORDER — LEVOTHYROXINE SODIUM 100 MCG PO TABS
100.0000 ug | ORAL_TABLET | Freq: Every morning | ORAL | Status: DC
  Administered 2023-10-27 – 2023-10-30 (×4): 100 ug via ORAL
  Filled 2023-10-27 (×4): qty 1

## 2023-10-27 MED ORDER — ACYCLOVIR 400 MG PO TABS
400.0000 mg | ORAL_TABLET | Freq: Every day | ORAL | Status: DC
  Administered 2023-10-27 – 2023-10-30 (×4): 400 mg via ORAL
  Filled 2023-10-27 (×4): qty 1

## 2023-10-27 NOTE — Evaluation (Signed)
 Physical Therapy Evaluation Patient Details Name: Kimberly Webb MRN: [id number] DOB: [birth year] Today's Date: 10/27/2023  History of Present Illness  Pt is 89 year old presented to [facility] on  10/26/23 for fatigue. Pt with acute cystitis and AKI. PMH - FTT, multiple rib fractures, OA, HTN, osteoporosis, TKR, possible dementia  Clinical Impression  Pt admitted with above diagnosis and presents to PT with functional limitations due to deficits listed below (See PT problem list). Pt needs skilled PT to maximize independence and safety. Pt able to stand and will work toward progressing to gait. Recommend return to  continued inpatient follow up therapy, <3 hours/day.           If plan is discharge home, recommend the following: A lot of help with walking and/or transfers;A lot of help with bathing/dressing/bathroom;Assist for transportation   Can travel by private vehicle   No    Equipment Recommendations None recommended by PT  Recommendations for Other Services       Functional Status Assessment Patient has had a recent decline in their functional status and demonstrates the ability to make significant improvements in function in a reasonable and predictable amount of time.     Precautions / Restrictions Precautions Precautions: Fall Recall of Precautions/Restrictions: Impaired Precaution/Restrictions Comments: urinary incontinence      Mobility  Bed Mobility Overal bed mobility: Needs Assistance Bed Mobility: Supine to Sit     Supine to sit: Mod assist, HOB elevated     General bed mobility comments: Assist to bring legs off of bed, elevate trunk into sitting and bring hips to EOB    Transfers Overall transfer level: Needs assistance Equipment used: Ambulation equipment used Transfers: Sit to/from Stand, Bed to chair/wheelchair/BSC Sit to Stand: +2 physical assistance, Min assist           General transfer comment: Assist to power up Transfer via Lift Equipment:  Stedy  Ambulation/Gait                  Stairs            Wheelchair Mobility     Tilt Bed    Modified Rankin (Stroke Patients Only)       Balance Overall balance assessment: Needs assistance Sitting-balance support: No upper extremity supported, Feet supported Sitting balance-Leahy Scale: Fair     Standing balance support: Bilateral upper extremity supported, During functional activity Standing balance-Leahy Scale: Poor Standing balance comment: BUE support and min assist for static standing                             Pertinent Vitals/Pain Pain Assessment Pain Assessment: Faces Faces Pain Scale: Hurts a little bit Pain Location: abdomen Pain Descriptors / Indicators: Discomfort Pain Intervention(s): Monitored during session, Repositioned    Home Living Family/patient expects to be discharged to:: Skilled nursing facility Living Arrangements: Spouse/significant other;Children Available Help at Discharge: Friend(s);Family Type of Home: House Home Access: Ramped entrance       Home Layout: One level Home Equipment: Rollator (4 wheels);Wheelchair - Forensic psychologist (2 wheels);BSC/3in1;Cane - single point      Prior Function Prior Level of Function : Needs assist       Physical Assist : Mobility (physical);ADLs (physical) Mobility (physical): Bed mobility;Transfers ADLs (physical): Bathing;Dressing;Toileting;IADLs Mobility Comments: Prior to fall at end of April pt was ambulatory with RW. Since then has required assist with all mobility at SNF and unsure if she has been  amb any ADLs Comments: Independent prior to fall at end of April. SNF since then requiring assist     Extremity/Trunk Assessment   Upper Extremity Assessment Upper Extremity Assessment: Defer to OT evaluation    Lower Extremity Assessment Lower Extremity Assessment: Generalized weakness       Communication   Communication Communication:  Impaired Factors Affecting Communication: Hearing impaired    Cognition Arousal: Alert Behavior During Therapy: Flat affect   PT - Cognitive impairments: History of cognitive impairments, Memory, Problem solving, Awareness                       PT - Cognition Comments: Slow to process. Unable to state level of function at SNF Following commands: Impaired Following commands impaired: Only follows one step commands consistently, Follows one step commands with increased time     Cueing Cueing Techniques: Verbal cues, Tactile cues     General Comments General comments (skin integrity, edema, etc.): VSS    Exercises     Assessment/Plan    PT Assessment Patient needs continued PT services  PT Problem List Decreased strength;Decreased activity tolerance;Decreased balance;Decreased mobility       PT Treatment Interventions DME instruction;Gait training;Functional mobility training;Therapeutic activities;Therapeutic exercise;Balance training;Patient/family education    PT Goals (Current goals can be found in the Care Plan section)  Acute Rehab PT Goals Patient Stated Goal: Not stated PT Goal Formulation: With patient Time For Goal Achievement: 11/10/23 Potential to Achieve Goals: Fair    Frequency Min 2X/week     Co-evaluation               AM-PAC PT "6 Clicks" Mobility  Outcome Measure Help needed turning from your back to your side while in a flat bed without using bedrails?: A Lot Help needed moving from lying on your back to sitting on the side of a flat bed without using bedrails?: A Lot Help needed moving to and from a bed to a chair (including a wheelchair)?: Total Help needed standing up from a chair using your arms (e.g., wheelchair or bedside chair)?: Total Help needed to walk in hospital room?: Total Help needed climbing 3-5 steps with a railing? : Total 6 Click Score: 8    End of Session   Activity Tolerance: Patient tolerated treatment  well Patient left: in chair;with call bell/phone within reach;with chair alarm set Nurse Communication: Mobility status;Need for lift equipment PT Visit Diagnosis: Unsteadiness on feet (R26.81);Other abnormalities of gait and mobility (R26.89);Muscle weakness (generalized) (M62.81);History of falling (Z91.81)    Time: 0981-1914 PT Time Calculation (min) (ACUTE ONLY): 15 min   Charges:   PT Evaluation $PT Eval Moderate Complexity: 1 Mod   PT General Charges $$ ACUTE PT VISIT: 1 Visit         [facility] PT Acute Rehabilitation Services Office [phone]   Pura Browns [facility] 10/27/2023, 10:32 AM

## 2023-10-27 NOTE — Hospital Course (Addendum)
 Hospital course:  This is third admission in May for this 89 yo F who was initially admitted with a fall, diagnosed with UTI and hypercalcemia and complicated by metabolic encephalopathy.  She was discharged to rehab 10/08/2023 and was readmitted 6 days later on 10/14/2023 for altered mental status and diagnosed with recurrent hypercalcemia and abnormal UA but insignificant growth on culture.  Extensive workup is consistent with primary hyperparathyroidism and patient was deemed not a surgical candidate.  She was treated with vitamin D  supplementation and continuation of pre-existing Prolia  with follow-up with outpatient endocrinology.  Patient was discharged 10/19/2023 and readmitted on 10/26/2023 with similar symptoms of fatigue/slugishness and altered mental status from SNF. W/u revealed recurrent hypercalcemia, and abnormal UA, possible recurrent UTI.  Patient was treated with calcitonin and started on ceftriaxone .  Urine cultures are ordered and pending.  Patient had been seen by palliative care on previous admission and family are aware of repercussions of significant decline in the past month but are hoping that she will continue to improve with control of hypercalcemia.  Patient has an appointment with outpatient [facility] end[facility]inology on 11/01/2023.  Subjective:  Patient states she is very tired, knows that she is at [facility] but is not exactly sure why she was brought here, she thinks maybe for some tests.  She denies any discomfort, she thinks she might be a little confused but is not sure.  Exam:  General: Extremely tired appearing female lying in bed in no acute discomfort. Eyes: sclera anicteric, conjuctiva mild injection bilaterally CVS: S1-S2, regular  Respiratory:  decreased air entry bilaterally secondary to decreased inspiratory effort, rales at bases  GI: NABS, soft, NT  LE: Warm and well-perfused    Assessment & Plan:  Fatigue and weakness Recurrent metabolic encephalopathy,  mild FTT Protein-calorie malnutrition, severe Similar presentation to previous 2 admissions this month Most likely secondary to recurrent hyperparathyroidism and possible ongoing/recurrent UTI (versus asymptomatic bacteriuria) and likely intravascular volume depletion. Appreciate ongoing physical therapy involvement Reconsult dietary for ongoing calorie counts/support; they have followed this patient in the past.  Recurrent hypercalcemia Primary Hyperparathyroidsm--not considered surgical candidate Osteoporosis, s/p rib fractures after recent fall Patient was treated with 2 L NS bolus and calcitonin with improvement but not normalization of her calcium.  Will continue fluid resuscitation with normal saline at 100 and give 1 more dose of calcitonin and follow symptoms and calcium level. Given significant need for ongoing fluid resuscitation, will order echocardiogram as patient does not appear to have ever had one before. Another pressing issue is how to manage persistent hypercalcemia as an outpatient in this elderly patient who already has osteoporosis and is not a surgical candidate for parathyroidectomy. Patient does have an appointment with [facility] endocrinology on Friday, would like to discharge patient so she can make it to that appointment.  She had been discharged the last 2 times on vitamin D  and ongoing Prolia  however was readmitted with hypercalcemia, may need to add Cinacalcet per endocrinology.  UTI versus asymptomatic bacteriuria Patient had persistently abnormal UA on last admission but cultures were negative. On her first admission in early May, patient grew out greater than 100K E. coli and Aerococcus species and 10K Proteus. UA today shows positive nitrates, small leuk esterase and many bacteria. I am inclined to think she does have a UTI, will continue ceftriaxone  day #2 today. Urine cultures ordered and pending.   Abnormal chest x-ray Chest x-ray read as having possible  retrocardiac infiltrates versus atelectasis Patient has no shortness  of breath or cough, she is saturating at 97% on room air She is afebrile and has no leukocytosis Can repeat chest x-ray or order CT if patient were to develop any pulmonary symptoms  Essential hypertension Continue amlodipine , can restart lisinopril  if BP remains elevated  Dyslipidemia Change atorvastatin to simvastatin  given concurrent treatment with amlodipine   CKD 3 A Patient's creatinine is at baseline    Hypothyroidism Synthroid  at 100 mcg po q day  Goals for care Discussed patient's multiple medical problems and recurrent admissions with patient's daughter [name].  She notes that patient was fully participating in rehab as of last week and she hopes her mother will be able to return to that state.  She does understand that her mother has a multiple comorbid medical problems and has advanced age.  She has previously spoken with palliative care and at this time would like to with present plan of care.    DVT prophylaxis: SCD Code Status: Full Family Communication: Spoke with patient's daughter [name] over the phone today Disposition: Rehab

## 2023-10-27 NOTE — Progress Notes (Addendum)
 PROGRESS NOTE    Kimberly Webb  ZOX:[id number] DOB: 01/11/1933 DOA: 10/26/2023 PCP: [name], MD    Hospital course:  This is third admission in May for this 89 yo F who was initially admitted with a fall, diagnosed with UTI and hypercalcemia and complicated by metabolic encephalopathy.  She was discharged to rehab 10/08/2023 and was readmitted 6 days later on 10/14/2023 for altered mental status and diagnosed with recurrent hypercalcemia and abnormal UA but insignificant growth on culture.  Extensive workup is consistent with primary hyperparathyroidism and patient was deemed not a surgical candidate.  She was treated with vitamin D  supplementation and continuation of pre-existing Prolia  with follow-up with outpatient endocrinology.  Patient was discharged 10/19/2023 and readmitted on 10/26/2023 with similar symptoms of fatigue/slugishness and altered mental status from SNF. W/u revealed recurrent hypercalcemia, and abnormal UA, possible recurrent UTI.  Patient was treated with calcitonin and started on ceftriaxone .  Urine cultures are ordered and pending.  Patient had been seen by palliative care on previous admission and family are aware of repercussions of significant decline in the past month but are hoping that she will continue to improve with control of hypercalcemia.  Patient has an appointment with outpatient [facility] endocrinology on 11/01/2023.  Subjective:  Patient states she is very tired, knows that she is at [facility] but is not exactly sure why she was brought here, she thinks maybe for some tests.  She denies any discomfort, she thinks she might be a little confused but is not sure.  Exam:  General: Extremely tired appearing female lying in bed in no acute discomfort. Eyes: sclera anicteric, conjuctiva mild injection bilaterally CVS: S1-S2, regular  Respiratory:  decreased air entry bilaterally secondary to decreased inspiratory effort, rales at bases  GI: NABS, soft, NT  LE:  Warm and well-perfused    Assessment & Plan:  Fatigue and weakness Recurrent metabolic encephalopathy, mild FTT Protein-calorie malnutrition, severe Similar presentation to previous 2 admissions this month Most likely secondary to recurrent hyperparathyroidism and possible ongoing/recurrent UTI (versus asymptomatic bacteriuria) and likely intravascular volume depletion. Appreciate ongoing physical therapy involvement Reconsult dietary for ongoing calorie counts/support; they have followed this patient in the past.  Recurrent hypercalcemia Primary Hyperparathyroidsm--not considered surgical candidate Osteoporosis, s/p rib fractures after recent fall Patient was treated with 2 L NS bolus and calcitonin with improvement but not normalization of her calcium.  Will continue fluid resuscitation with normal saline at 100 and give 1 more dose of calcitonin and follow symptoms and calcium level. Given significant need for ongoing fluid resuscitation, will order echocardiogram as patient does not appear to have ever had one before. Another pressing issue is how to manage persistent hypercalcemia as an outpatient in this elderly patient who already has osteoporosis and is not a surgical candidate for parathyroidectomy. Patient does have an appointment with [facility] endocrinology on Friday, would like to discharge patient so she can make it to that appointment.  She had been discharged the last 2 times on vitamin D  and ongoing Prolia  however was readmitted with hypercalcemia, may need to add Cinacalcet per endocrinology.  UTI versus asymptomatic bacteriuria Patient had persistently abnormal UA on last admission but cultures were negative. On her first admission in early May, patient grew out greater than 100K E. coli and Aerococcus species and 10K Proteus. UA today shows positive nitrates, small leuk esterase and many bacteria. I am inclined to think she does have a UTI, will continue ceftriaxone  day #2  today. Urine cultures ordered  and pending.   Abnormal chest x-ray Chest x-ray read as having possible retrocardiac infiltrates versus atelectasis Patient has no shortness of breath or cough, she is saturating at 97% on room air She is afebrile and has no leukocytosis Can repeat chest x-ray or order CT if patient were to develop any pulmonary symptoms  Essential hypertension Continue amlodipine , can restart lisinopril  if BP remains elevated  Dyslipidemia Change atorvastatin to simvastatin  given concurrent treatment with amlodipine   CKD 3 A Patient's creatinine is at baseline    Hypothyroidism Synthroid  at 100 mcg po q day  Goals for care Discussed patient's multiple medical problems and recurrent admissions with patient's daughter [name].  She notes that patient was fully participating in rehab as of last week and she hopes her mother will be able to return to that state.  She does understand that her mother has a multiple comorbid medical problems and has advanced age.  She has previously spoken with palliative care and at this time would like to with present plan of care.    DVT prophylaxis: SCD Code Status: Full Family Communication: Spoke with patient's daughter [name] over the phone today Disposition: Rehab                 Diet Orders (From admission, onward)     Start     Ordered   10/27/23 0303  Diet Heart Room service appropriate? Yes; Fluid consistency: Thin  Diet effective now       Question Answer Comment  Room service appropriate? Yes   Fluid consistency: Thin      10/27/23 0302            Objective: Vitals:   10/27/23 0010 10/27/23 0415 10/27/23 0854 10/27/23 1300  BP: (!) 155/70 (!) 174/78 (!) 172/81 (!) 161/68  Pulse: 73 70 69 65  Resp: 16 16  18   Temp: 97.9 F (36.6 C) 98.1 F (36.7 C) 98.3 F (36.8 C)   TempSrc: Oral Oral    SpO2: 91% 93% 96%   Weight:      Height:        Intake/Output Summary (Last 24 hours) at 10/27/2023  1414 Last data filed at 10/27/2023 1330 Gross per 24 hour  Intake 140 ml  Output 1800 ml  Net -1660 ml   Filed Weights   10/26/23 1816  Weight: 75 kg    Scheduled Meds:  acyclovir   400 mg Oral Daily   amLODipine   5 mg Oral Daily   atorvastatin  20 mg Oral QHS   calcitonin  4 Units/kg Intramuscular Once   docusate sodium   100 mg Oral BID   levothyroxine   100 mcg Oral q morning   senna  1 tablet Oral BID   Continuous Infusions:  sodium chloride  100 mL/hr at 10/27/23 1328   cefTRIAXone  (ROCEPHIN )  IV      Nutritional status     Body mass index is 30.24 kg/m.  Data Reviewed:   CBC: Recent Labs  Lab 10/26/23 1751 10/27/23 0733  WBC 8.8 8.9  HGB 10.0* 9.8*  HCT 31.4* 30.3*  MCV 102.6* 100.0  PLT 288 288   Basic Metabolic Panel: Recent Labs  Lab 10/26/23 1751 10/26/23 2026 10/27/23 0041 10/27/23 0733  NA 139  --  140 141  K 4.2  --  3.9 4.2  CL 108  --  111 111  CO2 22  --  24 23  GLUCOSE 102*  --  112* 114*  BUN 20  --  17  16  CREATININE 1.28*  --  1.18* 1.16*  CALCIUM 13.3*  --  12.4* 11.7*  MG  --  1.7  --   --   PHOS  --  3.4  --  2.4*   GFR: Estimated Creatinine Clearance: 31.8 mL/min (A) (by C-G formula based on SCr of 1.16 mg/dL (H)). Liver Function Tests: Recent Labs  Lab 10/26/23 1751 10/27/23 0733  AST 21 15  ALT 14 13  ALKPHOS 63 66  BILITOT 0.5 0.3  PROT 6.6 6.5  ALBUMIN 3.4* 3.3*   No results for input(s): "LIPASE", "AMYLASE" in the last 168 hours. Recent Labs  Lab 10/26/23 2201  AMMONIA 19   Coagulation Profile: No results for input(s): "INR", "PROTIME" in the last 168 hours. Cardiac Enzymes: Recent Labs  Lab 10/26/23 2026  CKTOTAL 14*   BNP (last 3 results) No results for input(s): "PROBNP" in the last 8760 hours. HbA1C: No results for input(s): "HGBA1C" in the last 72 hours. CBG: Recent Labs  Lab 10/26/23 1749  GLUCAP 107*   Lipid Profile: No results for input(s): "CHOL", "HDL", "LDLCALC", "TRIG", "CHOLHDL",  "LDLDIRECT" in the last 72 hours. Thyroid  Function Tests: Recent Labs    10/26/23 2026  TSH 1.661  FREET4 0.98   Anemia Panel: Recent Labs    10/26/23 2026 10/27/23 0041  VITAMINB12  --  688  FOLATE  --  26.7  FERRITIN 262  --   TIBC 287  --   IRON 30  --   RETICCTPCT 1.6  --    Sepsis Labs: Recent Labs  Lab 10/26/23 2026  PROCALCITON <0.10    Recent Results (from the past 240 hours)  Urine Culture (for pregnant, neutropenic or urologic patients or patients with an indwelling urinary catheter)     Status: Abnormal   Collection Time: 10/18/23  1:15 AM   Specimen: Urine, Clean Catch  Result Value Ref Range Status   Specimen Description URINE, CLEAN CATCH  Final   Special Requests NONE  Final   Culture (A)  Final    <10,000 COLONIES/mL INSIGNIFICANT GROWTH Performed at [facility], [address], [place], [place] [address]    Report Status 10/19/2023 FINAL  Final  Resp panel by RT-PCR (RSV, Flu A&B, Covid) Anterior Nasal Swab     Status: None   Collection Time: 10/26/23  6:17 PM   Specimen: Anterior Nasal Swab  Result Value Ref Range Status   SARS Coronavirus 2 by RT PCR NEGATIVE NEGATIVE Final   Influenza A by PCR NEGATIVE NEGATIVE Final   Influenza B by PCR NEGATIVE NEGATIVE Final    Comment: (NOTE) The Xpert Xpress SARS-CoV-2/FLU/RSV plus assay is intended as an aid in the diagnosis of influenza from Nasopharyngeal swab specimens and should not be used as a sole basis for treatment. Nasal washings and aspirates are unacceptable for Xpert Xpress SARS-CoV-2/FLU/RSV testing.  Fact Sheet for Patients: BloggerCourse.com  Fact Sheet for Healthcare Providers: SeriousBroker.it  This test is not yet approved or cleared by the [place]  FDA and has been authorized for detection and/or diagnosis of SARS-CoV-2 by FDA under an Emergency Use Authorization (EUA). This EUA will remain in effect (meaning this  test can be used) for the duration of the COVID-19 declaration under Section 564(b)(1) of the Act, 21 U.S.C. section 360bbb-3(b)(1), unless the authorization is terminated or revoked.     Resp Syncytial Virus by PCR NEGATIVE NEGATIVE Final    Comment: (NOTE) Fact Sheet for Patients: BloggerCourse.com  Fact Sheet  for Healthcare Providers: SeriousBroker.it  This test is not yet approved or cleared by the [place]  FDA and has been authorized for detection and/or diagnosis of SARS-CoV-2 by FDA under an Emergency Use Authorization (EUA). This EUA will remain in effect (meaning this test can be used) for the duration of the COVID-19 declaration under Section 564(b)(1) of the Act, 21 U.S.C. section 360bbb-3(b)(1), unless the authorization is terminated or revoked.  Performed at [facility], [address], [place], [place] [address]          Radiology Studies: DG Chest 2 View Result Date: 10/26/2023 CLINICAL DATA:  Fatigue since this morning.  Not as active as usual. EXAM: CHEST - 2 VIEW COMPARISON:  Radiograph 10/14/2023 FINDINGS: Stable cardiomediastinal silhouette. Aortic atherosclerotic calcification. Retrocardiac atelectasis or infiltrates. No pleural effusion or pneumothorax. Redemonstrated mildly displaced left ninth rib fracture. IMPRESSION: Retrocardiac atelectasis or infiltrates. Electronically Signed   By: Rozell Cornet M.D.   On: 10/26/2023 19:12           LOS: 1 day   Time spent= 35 mins    Magdalene School, MD [facility]  If 7PM-7AM, please contact night-coverage  10/27/2023, 2:14 PM

## 2023-10-27 NOTE — Progress Notes (Signed)
 Mobility Specialist: Progress Note   10/27/23 1302  Mobility  Activity Transferred from chair to bed  Level of Assistance Moderate assist, patient does 50-74%  Assistive Device Stedy  Activity Response Tolerated well  Mobility Referral Yes  Mobility visit 1 Mobility  Mobility Specialist Start Time (ACUTE ONLY) 1235  Mobility Specialist Stop Time (ACUTE ONLY) 1245  Mobility Specialist Time Calculation (min) (ACUTE ONLY) 10 min    Pt received in chair. ModA for STS. Transferred back to bed via stedy. Incontinent of urine upon standing. Left in bed with all needs met, call bell in reach. RN present.   Deloria Fetch Mobility Specialist Please contact via SecureChat or Rehab office at [phone]

## 2023-10-28 ENCOUNTER — Inpatient Hospital Stay (HOSPITAL_COMMUNITY)

## 2023-10-28 DIAGNOSIS — R609 Edema, unspecified: Secondary | ICD-10-CM | POA: Diagnosis not present

## 2023-10-28 DIAGNOSIS — I10 Essential (primary) hypertension: Secondary | ICD-10-CM | POA: Diagnosis not present

## 2023-10-28 LAB — URINE CULTURE

## 2023-10-28 LAB — BASIC METABOLIC PANEL WITH GFR
Anion gap: 8 (ref 5–15)
BUN: 18 mg/dL (ref 8–23)
CO2: 20 mmol/L — ABNORMAL LOW (ref 22–32)
Calcium: 10.6 mg/dL — ABNORMAL HIGH (ref 8.9–10.3)
Chloride: 111 mmol/L (ref 98–111)
Creatinine, Ser: 1.21 mg/dL — ABNORMAL HIGH (ref 0.44–1.00)
GFR, Estimated: 43 mL/min — ABNORMAL LOW (ref >60)
Glucose, Bld: 93 mg/dL (ref 70–99)
Potassium: 3.9 mmol/L (ref 3.5–5.1)
Sodium: 139 mmol/L (ref 135–145)

## 2023-10-28 LAB — ECHOCARDIOGRAM COMPLETE
Area-P 1/2: 2.11 cm2
Est EF: 55
Height: 62 in
S' Lateral: 3.1 cm
Weight: 2645.52 [oz_av]

## 2023-10-28 MED ORDER — CALCITONIN (SALMON) 200 UNIT/ML IJ SOLN
4.0000 [IU]/kg | Freq: Once | INTRAMUSCULAR | Status: AC
  Administered 2023-10-28: 300 [IU] via INTRAMUSCULAR
  Filled 2023-10-28: qty 1.5

## 2023-10-28 MED ORDER — ENSURE ENLIVE PO LIQD
237.0000 mL | Freq: Two times a day (BID) | ORAL | Status: DC
  Administered 2023-10-28 – 2023-10-30 (×5): 237 mL via ORAL

## 2023-10-28 MED ORDER — LISINOPRIL 10 MG PO TABS
10.0000 mg | ORAL_TABLET | Freq: Every day | ORAL | Status: DC
  Administered 2023-10-28 – 2023-10-29 (×2): 10 mg via ORAL
  Filled 2023-10-28 (×2): qty 1

## 2023-10-28 MED ORDER — ADULT MULTIVITAMIN W/MINERALS CH
1.0000 | ORAL_TABLET | Freq: Every day | ORAL | Status: DC
  Administered 2023-10-28 – 2023-10-30 (×3): 1 via ORAL
  Filled 2023-10-28 (×3): qty 1

## 2023-10-28 MED ORDER — THIAMINE MONONITRATE 100 MG PO TABS
100.0000 mg | ORAL_TABLET | Freq: Every day | ORAL | Status: DC
Start: 2023-10-28 — End: 2023-11-04
  Administered 2023-10-28 – 2023-10-30 (×3): 100 mg via ORAL
  Filled 2023-10-28 (×3): qty 1

## 2023-10-28 NOTE — Progress Notes (Signed)
  Echocardiogram 2D Echocardiogram has been performed.  Fain Home RDCS 10/28/2023, 3:30 PM

## 2023-10-28 NOTE — Evaluation (Signed)
 Occupational Therapy Evaluation Patient Details Name: Kimberly Webb MRN: [id number] DOB: [birth year] Today's Date: 10/28/2023   History of Present Illness   Pt is 89 year old presented to [facility]  [facility] for fatigue. Pt with acute cystitis and AKI. PMH - FTT, multiple rib fractures, OA, HTN, osteoporosis, TKR, possible dementia     Clinical Impressions Pt reports using RW/cane at baseline for mobility, has assist for ADLs, from SNF. Pt currently needing min-max A for ADLs, mod A for bed mobility, and min A for step pivot transfer using RW. Pt with decr cognition, slowed processing throughout with BADL/mobility tasks. Pt presenting with impairments listed below, will follow acutely. Patient will benefit from continued inpatient follow up therapy, <3 hours/day to maximize safety/ind with ADL/functional mobility.      If plan is discharge home, recommend the following:   A lot of help with bathing/dressing/bathroom;Assistance with cooking/housework;Assist for transportation;Direct supervision/assist for medications management;A lot of help with walking and/or transfers;Help with stairs or ramp for entrance     Functional Status Assessment   Patient has had a recent decline in their functional status and demonstrates the ability to make significant improvements in function in a reasonable and predictable amount of time.     Equipment Recommendations   Other (comment) (defer)     Recommendations for Other Services   PT consult     Precautions/Restrictions   Precautions Precautions: Fall Recall of Precautions/Restrictions: Impaired Precaution/Restrictions Comments: urinary incontinence Restrictions Weight Bearing Restrictions Per Provider Order: No     Mobility Bed Mobility Overal bed mobility: Needs Assistance Bed Mobility: Supine to Sit     Supine to sit: Mod assist     General bed mobility comments: mod A for trunk elevation    Transfers Overall transfer  level: Needs assistance Equipment used: Rolling walker (2 wheels) Transfers: Sit to/from Stand, Bed to chair/wheelchair/BSC Sit to Stand: Min assist           General transfer comment: min A for pivot transfer to chair      Balance Overall balance assessment: Needs assistance Sitting-balance support: No upper extremity supported, Feet supported Sitting balance-Leahy Scale: Fair     Standing balance support: Bilateral upper extremity supported, During functional activity Standing balance-Leahy Scale: Poor Standing balance comment: BUE support and min assist for static standing                           ADL either performed or assessed with clinical judgement   ADL Overall ADL's : Needs assistance/impaired Eating/Feeding: Set up;Sitting   Grooming: Minimal assistance;Sitting   Upper Body Bathing: Moderate assistance;Sitting   Lower Body Bathing: Moderate assistance;Sitting/lateral leans   Upper Body Dressing : Moderate assistance;Sitting   Lower Body Dressing: Moderate assistance;Sitting/lateral leans   Toilet Transfer: Minimal assistance;Stand-pivot;BSC/3in1   Toileting- Clothing Manipulation and Hygiene: Maximal assistance       Functional mobility during ADLs: Minimal assistance;Rolling walker (2 wheels)       Vision   Vision Assessment?: No apparent visual deficits     Perception Perception: Not tested       Praxis Praxis: Not tested       Pertinent Vitals/Pain Pain Assessment Pain Assessment: No/denies pain     Extremity/Trunk Assessment Upper Extremity Assessment Upper Extremity Assessment: Generalized weakness   Lower Extremity Assessment Lower Extremity Assessment: Defer to PT evaluation   Cervical / Trunk Assessment Cervical / Trunk Assessment: Normal   Communication Communication Communication: Impaired  Factors Affecting Communication: Hearing impaired   Cognition Arousal: Alert Behavior During Therapy: Flat  affect Cognition: Cognition impaired   Orientation impairments: Situation Awareness: Intellectual awareness impaired, Online awareness impaired   Attention impairment (select first level of impairment): Focused attention   OT - Cognition Comments: slowed processing/delayed responses throughout                 Following commands: Impaired Following commands impaired: Only follows one step commands consistently, Follows one step commands with increased time     Cueing  General Comments   Cueing Techniques: Verbal cues;Tactile cues  VSS   Exercises     Shoulder Instructions      Home Living Family/patient expects to be discharged to:: Skilled nursing facility                                 Additional Comments: [facility]      Prior Functioning/Environment Prior Level of Function : Needs assist             Mobility Comments: Reports uses cane, quesitonable ADLs Comments: assist for ADLs    OT Problem List: Decreased strength;Impaired balance (sitting and/or standing);Decreased safety awareness;Decreased knowledge of use of DME or AE;Decreased activity tolerance;Decreased cognition;Decreased coordination   OT Treatment/Interventions: Self-care/ADL training;Therapeutic exercise;DME and/or AE instruction;Therapeutic activities;Patient/family education;Energy conservation;Cognitive remediation/compensation;Balance training      OT Goals(Current goals can be found in the care plan section)   Acute Rehab OT Goals Patient Stated Goal: did not state OT Goal Formulation: With patient Time For Goal Achievement: 11/11/23 Potential to Achieve Goals: Good ADL Goals Pt Will Perform Upper Body Dressing: with contact guard assist;sitting Pt Will Perform Lower Body Dressing: with min assist;sitting/lateral leans;sit to/from stand Pt Will Transfer to Toilet: with min assist;ambulating;regular height toilet Pt Will Perform Toileting - Clothing  Manipulation and hygiene: sitting/lateral leans;sit to/from stand;with contact guard assist   OT Frequency:  Min 1X/week    Co-evaluation              AM-PAC OT "6 Clicks" Daily Activity     Outcome Measure Help from another person eating meals?: A Little Help from another person taking care of personal grooming?: A Little Help from another person toileting, which includes using toliet, bedpan, or urinal?: A Lot Help from another person bathing (including washing, rinsing, drying)?: A Lot Help from another person to put on and taking off regular upper body clothing?: A Lot Help from another person to put on and taking off regular lower body clothing?: A Lot 6 Click Score: 14   End of Session Equipment Utilized During Treatment: Gait belt;Rolling walker (2 wheels) Nurse Communication: Mobility status  Activity Tolerance: Patient limited by fatigue Patient left: in chair;with call bell/phone within reach;with chair alarm set  OT Visit Diagnosis: Unsteadiness on feet (R26.81);Other abnormalities of gait and mobility (R26.89);Muscle weakness (generalized) (M62.81);History of falling (Z91.81);Other symptoms and signs involving cognitive function;Adult, failure to thrive (R62.7)                Time: 0802-0824 OT Time Calculation (min): 22 min Charges:  OT General Charges $OT Visit: 1 Visit OT Evaluation $OT Eval Moderate Complexity: 1 Mod  Jilliann Subramanian K, OTD, OTR/L SecureChat Preferred Acute Rehab [phone]   Benedict Brain Koonce 10/28/2023, 8:33 AM

## 2023-10-28 NOTE — Progress Notes (Signed)
 RLE venous duplex has been completed.   Results can be found under chart review under CV PROC. 10/28/2023 4:22 PM Rheda Kassab RVT, RDMS

## 2023-10-28 NOTE — Progress Notes (Signed)
 PROGRESS NOTE    Kimberly Webb  JYN:[id number] DOB: [birth year] DOA: 10/26/2023 PCP: [name], MD    Hospital course:  This is third admission in May for this 89 yo F who was initially admitted with a fall, diagnosed with UTI and hypercalcemia and complicated by metabolic encephalopathy.  She was discharged to rehab 10/08/2023 and was readmitted 6 days later on 10/14/2023 for altered mental status and diagnosed with recurrent hypercalcemia and abnormal UA but insignificant growth on culture.  Extensive workup is consistent with primary hyperparathyroidism and patient was deemed not a surgical candidate.  She was treated with vitamin D  supplementation and continuation of pre-existing Prolia  with follow-up with outpatient endocrinology.  Patient was discharged 10/19/2023 and readmitted on 10/26/2023 with similar symptoms of fatigue/slugishness and altered mental status from SNF. W/u revealed recurrent hypercalcemia, and abnormal UA, possible recurrent UTI.  Patient was treated with calcitonin and started on ceftriaxone .  Urine cultures are ordered and pending.  Patient had been seen by palliative care on previous admission and family are aware of repercussions of significant decline in the past month but are hoping that she will continue to improve with control of hypercalcemia.  Patient has an appointment with outpatient [facility] endocrinology on 11/01/2023.  Subjective:  Patient states she feels ok, no acute complaints, states she is hungry, waiting for breakfast. Admits to some confusion but much less than  yesterday.  Is hoping her daughter will come visit her today.  Exam:  General: Patient looks much better sitting up in recliner awaiting breakfast Eyes: sclera anicteric, conjuctiva mild injection bilaterally CVS: S1-S2, regular  Respiratory:  decreased air entry bilaterally secondary to decreased inspiratory effort, rales at bases  GI: NABS, soft, NT  LE: Warm and  well-perfused    Assessment & Plan:  Fatigue and weakness Recurrent metabolic encephalopathy, mild FTT Protein-calorie malnutrition, severe Patient much improved today, mostly likely from decreasing Ca but may also be from antibiotics for possible UTI  Similar presentation to previous 2 admissions this month Most likely secondary to recurrent hyperparathyroidism and possible ongoing/recurrent UTI (versus asymptomatic bacteriuria) and likely intravascular volume depletion. Appreciate ongoing physical therapy involvement Reconsult dietary for ongoing calorie counts/support; they have followed this patient in the past.  Recurrent hypercalcemia Primary Hyperparathyroidsm--not considered surgical candidate Osteoporosis, s/p rib fractures after recent fall Ca is lower s/p Calcitonin x 2 yesterda, but still elevated and corrects to 11.3 this morning  Patient is still not at her baseline, still with some confusion and fatigue Will give one more dose of Calcitonin now Continue maintenance fluids NS at 100 Magnesium  is low normal at 1.7, phosphorus is 2.4, will repeat in the morning prior to supplementation  Echocardiogram is ordered and pending today, given need for aggressive fluid resuscitation, would be helpful to have baseline echo.   Another pressing issue is how to manage persistent hypercalcemia as an outpatient in this elderly patient who already has osteoporosis and is not a surgical candidate for parathyroidectomy. Patient does have an outpatient appointment with [facility] endocrinology on Friday, would like to discharge patient so she can make it to that appointment.  She had been discharged the last 2 times on vitamin D  and ongoing Prolia  however was readmitted with hypercalcemia, may need to add Cinacalcet  as an outpatient per endocrinology.  UTI versus asymptomatic bacteriuria Patient had persistently abnormal UA on last admission but cultures were negative. On her first admission  in early May, patient grew out greater than 100K E. coli and Aerococcus  species and 10K Proteus. UA today shows positive nitrates, small leuk esterase and many bacteria. Unfortunately urine culture shows multiple species and recommends re-collection I am inclined to think she does have a UTI Will continue ceftriaxone  day #3 today  Abnormal chest x-ray Chest x-ray read as having possible retrocardiac infiltrates versus atelectasis Patient has no shortness of breath or cough, she is saturating at 97% on room air She is afebrile and has no leukocytosis Can repeat chest x-ray or order CT if patient were to develop any pulmonary symptoms  Essential hypertension Continue amlodipine  and lisinopril  per home doses  Dyslipidemia Change atorvastatin  to simvastatin  given concurrent treatment with amlodipine   CKD 3 A Patient's creatinine is at baseline    Hypothyroidism Synthroid  at 100 mcg po q day  Goals for care Discussed patient's multiple medical problems and recurrent admissions with patient's daughter [name].  She notes that patient was fully participating in rehab as of last week and she hopes her mother will be able to return to that state.  She does understand that her mother has a multiple comorbid medical problems and has advanced age.  She has previously spoken with palliative care and at this time would like to with present plan of care.    DVT prophylaxis: SCD Code Status: Full Family Communication: Spoke with patient's daughter [name] over the phone today Disposition: Rehab      Diet Orders (From admission, onward)     Start     Ordered   10/28/23 0928  Diet regular Room service appropriate? Yes with Assist; Fluid consistency: Thin  Diet effective now       Question Answer Comment  Room service appropriate? Yes with Assist   Fluid consistency: Thin      10/28/23 0927            Objective: Vitals:   10/28/23 0444 10/28/23 0741 10/28/23 1100 10/28/23 1635  BP:  (!) 159/64 (!) 146/63 (!) 136/55 (!) 145/59  Pulse: 63 63 62 68  Resp: 18     Temp: 99.1 F (37.3 C) 97.7 F (36.5 C) 98.6 F (37 C) 98.3 F (36.8 C)  TempSrc: Oral     SpO2: 97% 95% 98% 96%  Weight:      Height:        Intake/Output Summary (Last 24 hours) at 10/28/2023 1742 Last data filed at 10/28/2023 1330 Gross per 24 hour  Intake --  Output 1700 ml  Net -1700 ml   Filed Weights   10/26/23 1816  Weight: 75 kg    Scheduled Meds:  acyclovir   400 mg Oral Daily   amLODipine   5 mg Oral Daily   atorvastatin   20 mg Oral QHS   docusate sodium   100 mg Oral BID   feeding supplement  237 mL Oral BID BM   levothyroxine   100 mcg Oral q morning   lisinopril   10 mg Oral Daily   multivitamin with minerals  1 tablet Oral Daily   senna  1 tablet Oral BID   thiamine   100 mg Oral Daily   Continuous Infusions:  cefTRIAXone  (ROCEPHIN )  IV 1 g (10/27/23 1730)    Nutritional status Signs/Symptoms: per patient/family report Interventions: Refer to RD note for recommendations Body mass index is 30.24 kg/m.  Data Reviewed:   CBC: Recent Labs  Lab 10/26/23 1751 10/27/23 0733  WBC 8.8 8.9  HGB 10.0* 9.8*  HCT 31.4* 30.3*  MCV 102.6* 100.0  PLT 288 288   Basic Metabolic Panel: Recent  Labs  Lab 10/26/23 1751 10/26/23 2026 10/27/23 0041 10/27/23 0733 10/27/23 1629 10/28/23 0048  NA 139  --  140 141 140 139  K 4.2  --  3.9 4.2 4.1 3.9  CL 108  --  111 111 108 111  CO2 22  --  24 23 22  20*  GLUCOSE 102*  --  112* 114* 109* 93  BUN 20  --  17 16 16 18   CREATININE 1.28*  --  1.18* 1.16* 1.17* 1.21*  CALCIUM 13.3*  --  12.4* 11.7* 11.2* 10.6*  MG  --  1.7  --   --   --   --   PHOS  --  3.4  --  2.4*  --   --    GFR: Estimated Creatinine Clearance: 30.5 mL/min (A) (by C-G formula based on SCr of 1.21 mg/dL (H)). Liver Function Tests: Recent Labs  Lab 10/26/23 1751 10/27/23 0733  AST 21 15  ALT 14 13  ALKPHOS 63 66  BILITOT 0.5 0.3  PROT 6.6 6.5  ALBUMIN 3.4*  3.3*   No results for input(s): "LIPASE", "AMYLASE" in the last 168 hours. Recent Labs  Lab 10/26/23 2201  AMMONIA 19   Coagulation Profile: No results for input(s): "INR", "PROTIME" in the last 168 hours. Cardiac Enzymes: Recent Labs  Lab 10/26/23 2026  CKTOTAL 14*   BNP (last 3 results) No results for input(s): "PROBNP" in the last 8760 hours. HbA1C: No results for input(s): "HGBA1C" in the last 72 hours. CBG: Recent Labs  Lab 10/26/23 1749  GLUCAP 107*   Lipid Profile: No results for input(s): "CHOL", "HDL", "LDLCALC", "TRIG", "CHOLHDL", "LDLDIRECT" in the last 72 hours. Thyroid  Function Tests: Recent Labs    10/26/23 2026  TSH 1.661  FREET4 0.98   Anemia Panel: Recent Labs    10/26/23 2026 10/27/23 0041  VITAMINB12  --  688  FOLATE  --  26.7  FERRITIN 262  --   TIBC 287  --   IRON 30  --   RETICCTPCT 1.6  --    Sepsis Labs: Recent Labs  Lab 10/26/23 2026  PROCALCITON <0.10    Recent Results (from the past 240 hours)  Resp panel by RT-PCR (RSV, Flu A&B, Covid) Anterior Nasal Swab     Status: None   Collection Time: 10/26/23  6:17 PM   Specimen: Anterior Nasal Swab  Result Value Ref Range Status   SARS Coronavirus 2 by RT PCR NEGATIVE NEGATIVE Final   Influenza A by PCR NEGATIVE NEGATIVE Final   Influenza B by PCR NEGATIVE NEGATIVE Final    Comment: (NOTE) The Xpert Xpress SARS-CoV-2/FLU/RSV plus assay is intended as an aid in the diagnosis of influenza from Nasopharyngeal swab specimens and should not be used as a sole basis for treatment. Nasal washings and aspirates are unacceptable for Xpert Xpress SARS-CoV-2/FLU/RSV testing.  Fact Sheet for Patients: BloggerCourse.com  Fact Sheet for Healthcare Providers: SeriousBroker.it  This test is not yet approved or cleared by the [place]  FDA and has been authorized for detection and/or diagnosis of SARS-CoV-2 by FDA under an Emergency Use  Authorization (EUA). This EUA will remain in effect (meaning this test can be used) for the duration of the COVID-19 declaration under Section 564(b)(1) of the Act, 21 U.S.C. section 360bbb-3(b)(1), unless the authorization is terminated or revoked.     Resp Syncytial Virus by PCR NEGATIVE NEGATIVE Final    Comment: (NOTE) Fact Sheet for Patients: BloggerCourse.com  Fact Sheet for Healthcare Providers:  SeriousBroker.it  This test is not yet approved or cleared by the [place]  FDA and has been authorized for detection and/or diagnosis of SARS-CoV-2 by FDA under an Emergency Use Authorization (EUA). This EUA will remain in effect (meaning this test can be used) for the duration of the COVID-19 declaration under Section 564(b)(1) of the Act, 21 U.S.C. section 360bbb-3(b)(1), unless the authorization is terminated or revoked.  Performed at [facility], [address], [place], [place] [address]   Urine Culture     Status: Abnormal   Collection Time: 10/26/23  6:29 PM   Specimen: Urine, Clean Catch  Result Value Ref Range Status   Specimen Description URINE, CLEAN CATCH  Final   Special Requests   Final    NONE Performed at [facility], [address], [place], [place] [address]    Culture MULTIPLE SPECIES PRESENT, SUGGEST RECOLLECTION (A)  Final   Report Status 10/28/2023 FINAL  Final         Radiology Studies: VAS US  LOWER EXTREMITY VENOUS (DVT) Result Date: 10/28/2023  Lower Venous DVT Study Patient Name:  TIASIA WEBERG  Date of Exam:   10/28/2023 Medical Rec #: [id number]        Accession #:    [id number] Date of Birth: September 22, 1932        Patient Gender: F Patient Age:   49 years Exam Location:  [facility] Procedure:      VAS US  LOWER EXTREMITY VENOUS (DVT) Referring Phys: Mardy Shall DOUTOVA --------------------------------------------------------------------------------  Indications: Edema.   Limitations: Patient position - unable to position properly. Comparison Study: No previous exams Performing Technologist: Jody Hill RVT, RDMS  Examination Guidelines: A complete evaluation includes B-mode imaging, spectral Doppler, color Doppler, and power Doppler as needed of all accessible portions of each vessel. Bilateral testing is considered an integral part of a complete examination. Limited examinations for reoccurring indications may be performed as noted. The reflux portion of the exam is performed with the patient in reverse Trendelenburg.  +---------+---------------+---------+-----------+----------+--------------+ RIGHT    CompressibilityPhasicitySpontaneityPropertiesThrombus Aging +---------+---------------+---------+-----------+----------+--------------+ CFV      Full           Yes      Yes                                 +---------+---------------+---------+-----------+----------+--------------+ SFJ      Full                                                        +---------+---------------+---------+-----------+----------+--------------+ FV Prox  Full           Yes      Yes                                 +---------+---------------+---------+-----------+----------+--------------+ FV Mid   Full           Yes      Yes                                 +---------+---------------+---------+-----------+----------+--------------+ FV DistalFull           Yes  Yes                                 +---------+---------------+---------+-----------+----------+--------------+ PFV      Full                                                        +---------+---------------+---------+-----------+----------+--------------+ POP      Full           Yes      Yes                                 +---------+---------------+---------+-----------+----------+--------------+ PTV      Full                                                         +---------+---------------+---------+-----------+----------+--------------+ PERO     Full                                                        +---------+---------------+---------+-----------+----------+--------------+   +----+---------------+---------+-----------+----------+--------------+ LEFTCompressibilityPhasicitySpontaneityPropertiesThrombus Aging +----+---------------+---------+-----------+----------+--------------+ CFV Full           Yes      Yes                                 +----+---------------+---------+-----------+----------+--------------+     Summary: RIGHT: - There is no evidence of deep vein thrombosis in the lower extremity.  - No cystic structure found in the popliteal fossa.  LEFT: - No evidence of common femoral vein obstruction.   *See table(s) above for measurements and observations.    Preliminary    ECHOCARDIOGRAM COMPLETE Result Date: 10/28/2023    ECHOCARDIOGRAM REPORT   Patient Name:   JENYFER TRAWICK Date of Exam: 10/28/2023 Medical Rec #:  [id number]       Height:       62.0 in Accession #:    [id number]      Weight:       165.3 lb Date of Birth:  01-05-1933       BSA:          1.763 m Patient Age:    89 years        BP:           136/55 mmHg Patient Gender: F               HR:           63 bpm. Exam Location:  Inpatient Procedure: 2D Echo, Cardiac Doppler and Color Doppler (Both Spectral and Color            Flow Doppler were utilized during procedure). Indications:    Malaise and fatigue  History:        Patient has no prior history of Echocardiogram examinations.  Sonographer:    Hersey Lorenzo  RDCS Referring Phys: [id number] Marcy Sookdeo TUBLU Gautham Hewins IMPRESSIONS  1. Left ventricular ejection fraction, by estimation, is 55%. The left ventricle has normal function. The left ventricle has no regional wall motion abnormalities. Left ventricular diastolic parameters are consistent with Grade I diastolic dysfunction (impaired relaxation).  2. Right ventricular  systolic function is normal. The right ventricular size is mildly enlarged. Tricuspid regurgitation signal is inadequate for assessing PA pressure.  3. The mitral valve is normal in structure. No evidence of mitral valve regurgitation. No evidence of mitral stenosis.  4. The aortic valve is tricuspid. There is mild calcification of the aortic valve. Aortic valve regurgitation is trivial. No aortic stenosis is present.  5. The inferior vena cava is normal in size with greater than 50% respiratory variability, suggesting right atrial pressure of 3 mmHg.  6. A small pericardial effusion is present. The pericardial effusion is localized near the right ventricle. FINDINGS  Left Ventricle: Left ventricular ejection fraction, by estimation, is 55%. The left ventricle has normal function. The left ventricle has no regional wall motion abnormalities. The left ventricular internal cavity size was normal in size. There is no left ventricular hypertrophy. Left ventricular diastolic parameters are consistent with Grade I diastolic dysfunction (impaired relaxation). Right Ventricle: The right ventricular size is mildly enlarged. No increase in right ventricular wall thickness. Right ventricular systolic function is normal. Tricuspid regurgitation signal is inadequate for assessing PA pressure. Left Atrium: Left atrial size was normal in size. Right Atrium: Right atrial size was normal in size. Pericardium: A small pericardial effusion is present. The pericardial effusion is localized near the right ventricle. Mitral Valve: The mitral valve is normal in structure. No evidence of mitral valve regurgitation. No evidence of mitral valve stenosis. Tricuspid Valve: The tricuspid valve is normal in structure. Tricuspid valve regurgitation is trivial. Aortic Valve: The aortic valve is tricuspid. There is mild calcification of the aortic valve. Aortic valve regurgitation is trivial. No aortic stenosis is present. Pulmonic Valve: The  pulmonic valve was normal in structure. Pulmonic valve regurgitation is not visualized. Aorta: The aortic root is normal in size and structure. Venous: The inferior vena cava is normal in size with greater than 50% respiratory variability, suggesting right atrial pressure of 3 mmHg. IAS/Shunts: No atrial level shunt detected by color flow Doppler.  LEFT VENTRICLE PLAX 2D LVIDd:         4.50 cm   Diastology LVIDs:         3.10 cm   LV e' medial:    6.53 cm/s LV PW:         1.00 cm   LV E/e' medial:  8.9 LV IVS:        0.90 cm   LV e' lateral:   5.77 cm/s LVOT diam:     2.10 cm   LV E/e' lateral: 10.1 LV SV:         62 LV SV Index:   35 LVOT Area:     3.46 cm  RIGHT VENTRICLE             IVC RV S prime:     14.10 cm/s  IVC diam: 1.80 cm TAPSE (M-mode): 2.0 cm LEFT ATRIUM             Index LA diam:        4.10 cm 2.33 cm/m LA Vol (A2C):   48.5 ml 27.51 ml/m LA Vol (A4C):   33.1 ml 18.77 ml/m LA Biplane Vol: 42.3 ml 23.99 ml/m  AORTIC VALVE LVOT Vmax:   91.80 cm/s LVOT Vmean:  57.100 cm/s LVOT VTI:    0.178 m  AORTA Ao Root diam: 3.30 cm Ao Asc diam:  3.60 cm MITRAL VALVE MV Area (PHT): 2.11 cm     SHUNTS MV Decel Time: 359 msec     Systemic VTI:  0.18 m MV E velocity: 58.20 cm/s   Systemic Diam: 2.10 cm MV A velocity: 108.00 cm/s MV E/A ratio:  0.54 Dalton McleanMD Electronically signed by Archer Bear Signature Date/Time: 10/28/2023/3:38:20 PM    Final    DG Chest 2 View Result Date: 10/26/2023 CLINICAL DATA:  Fatigue since this morning.  Not as active as usual. EXAM: CHEST - 2 VIEW COMPARISON:  Radiograph 10/14/2023 FINDINGS: Stable cardiomediastinal silhouette. Aortic atherosclerotic calcification. Retrocardiac atelectasis or infiltrates. No pleural effusion or pneumothorax. Redemonstrated mildly displaced left ninth rib fracture. IMPRESSION: Retrocardiac atelectasis or infiltrates. Electronically Signed   By: Rozell Cornet M.D.   On: 10/26/2023 19:12           LOS: 2 days   Time spent= 35  mins    Magdalene School, MD [facility]  If 7PM-7AM, please contact night-coverage  10/28/2023, 5:42 PM

## 2023-10-28 NOTE — Progress Notes (Signed)
 Mobility Specialist: Progress Note   10/28/23 1448  Mobility  Activity Transferred from chair to bed  Level of Assistance Moderate assist, patient does 50-74%  Assistive Device Front wheel walker  Activity Response Tolerated well  Mobility Referral Yes  Mobility visit 1 Mobility  Mobility Specialist Start Time (ACUTE ONLY) 1131  Mobility Specialist Stop Time (ACUTE ONLY) 1147  Mobility Specialist Time Calculation (min) (ACUTE ONLY) 16 min    Pt received in chair, requesting assistance to get back to bed. ModA for STS, minA for stand pivot to bed. Incontinent of urine upon standing. MinA for bed mobility to assist BLEs. Left in bed with all needs met, call bell in reach.   Deloria Fetch Mobility Specialist Please contact via SecureChat or Rehab office at [phone]

## 2023-10-28 NOTE — Progress Notes (Signed)
 Initial Nutrition Assessment  DOCUMENTATION CODES:   Not applicable *Pt had a previous diagnosis of malnutrition but unable to confirm this admission. Will attempt to collect more evidence on follow up.   INTERVENTION:   Encourage PO intake Room service with assist  Feeding assistance  Liberalize diet to promote PO intake Calorie count per MD Ensure Enlive po BID, each supplement provides 350 kcal and 20 grams of protein. Magic cup TID with meals, each supplement provides 290 kcal and 9 grams of protein - Prefers wild berry  100 mg Thiamine  x 7 days Monitor magnesium , potassium, and phosphorus BID for at least 3 days, MD to replete as needed, as pt is at risk for refeeding syndrome  MVI with minerals daily  Recommend updated weight - Discussed with nursing  NUTRITION DIAGNOSIS:   Inadequate oral intake related to acute illness as evidenced by per patient/family report.   GOAL:   Patient will meet greater than or equal to 90% of their needs   MONITOR:   PO intake, Supplement acceptance, Labs, Weight trends  REASON FOR ASSESSMENT:   Consult Assessment of nutrition requirement/status, Poor PO, Calorie Count  ASSESSMENT:  89 y.o with multiple readmissions within the last month due to hypercalcemia. Presents from SNF with fatigur and AMS, found to have recurrent hypercalcemia and possible UTI. PMH of HTN, HLD, hypothyroidism, osteoporosis, recurrent UTI, FTT, rib fractures, anemia, severe malnutrition.  RD working remotely, history obtained from chart review.   Pt initially admitted to the ED 4/21/20205 after a fall and discharged to rehab on 10/08/2023. Then had a hospital admission 6 days later on 10/14/2023 for AMS, found to have recurrent hypercalcemia and abnormal UA. Discharged 10/19/2023 and readmitted 10/26/2023 with similar symptoms of fatigue and AMS. Found to have hypercalcemia and possible UTI.  Palliative has been following pt for GOC. Pt has had a significant  decline in the last month with noted poor PO intake, and reduced mobility. Pt last seen by RD 10/04/2023 where it was noted pt had poor PO intake, weight loss and severe malnutrition.  Reviewed weight history and pt has gained weight since 10/02/2023. Question if this is accurate as weight has been trending down previously. Recommend updated weight since weight on admission is carried over. Suspect pt is malnourished but unable to confirm at this time. Exam deferred to follow up.    RD consulted for assessment and calorie count. RD messaged nursing to hang calorie count envelope and document % consumed on meal tickets. Suspect pt is at refeeding risk, phos already low. Add ONS and manage lytes.   Admit weight: 75 kg Current weight: 75 kg   Edema: Non-pitting RUE, LUE, LLE, RLE   Average Meal Intake: No meals recorded   Nutritionally Relevant Medications: Scheduled Meds:  acyclovir   400 mg Oral Daily   amLODipine   5 mg Oral Daily   atorvastatin  20 mg Oral QHS   docusate sodium   100 mg Oral BID   feeding supplement  237 mL Oral BID BM   levothyroxine   100 mcg Oral q morning   multivitamin with minerals  1 tablet Oral Daily   senna  1 tablet Oral BID   thiamine   100 mg Oral Daily   Continuous Infusions:  sodium chloride  100 mL/hr at 10/27/23 1328   cefTRIAXone  (ROCEPHIN )  IV 1 g (10/27/23 1730)   Labs Reviewed: Creatinine 1.21 Calcium 10.6 eGFR 51 Phosphorus 2.4 Albumin 3.3 GFR 43 Vitamin D , 1-25: 9.8 CBG ranges from 93-114 mg/dL over  the last 24 hours HgbA1c 5.9   NUTRITION - FOCUSED PHYSICAL EXAM: -Deferred to follow up   Diet Order:   Diet Order             Diet Heart Room service appropriate? Yes with Assist; Fluid consistency: Thin  Diet effective now                   EDUCATION NEEDS:   Not appropriate for education at this time  Skin:  Skin Assessment: Skin Integrity Issues: Skin Integrity Issues:: Incisions Incisions: Sacrum  Last BM:   PTA  Height:   Ht Readings from Last 1 Encounters:  10/26/23 5\' 2"  (1.575 m)    Weight:   Wt Readings from Last 1 Encounters:  10/26/23 75 kg    Ideal Body Weight:  50 kg  BMI:  Body mass index is 30.24 kg/m.  Estimated Nutritional Needs:   Kcal:  1500-1700 kcal  Protein:  70-90 gm  Fluid:  >1.5L/day   Frederik Jansky, RD Registered Dietitian  See Amion for more information

## 2023-10-29 ENCOUNTER — Encounter: Payer: Self-pay | Admitting: Physician Assistant

## 2023-10-29 DIAGNOSIS — N179 Acute kidney failure, unspecified: Secondary | ICD-10-CM | POA: Diagnosis not present

## 2023-10-29 DIAGNOSIS — R9389 Abnormal findings on diagnostic imaging of other specified body structures: Secondary | ICD-10-CM | POA: Diagnosis not present

## 2023-10-29 DIAGNOSIS — G9341 Metabolic encephalopathy: Secondary | ICD-10-CM | POA: Diagnosis not present

## 2023-10-29 LAB — BASIC METABOLIC PANEL WITH GFR
Anion gap: 7 (ref 5–15)
BUN: 17 mg/dL (ref 8–23)
CO2: 21 mmol/L — ABNORMAL LOW (ref 22–32)
Calcium: 10.8 mg/dL — ABNORMAL HIGH (ref 8.9–10.3)
Chloride: 112 mmol/L — ABNORMAL HIGH (ref 98–111)
Creatinine, Ser: 1.09 mg/dL — ABNORMAL HIGH (ref 0.44–1.00)
GFR, Estimated: 49 mL/min — ABNORMAL LOW (ref >60)
Glucose, Bld: 94 mg/dL (ref 70–99)
Potassium: 3.8 mmol/L (ref 3.5–5.1)
Sodium: 140 mmol/L (ref 135–145)

## 2023-10-29 LAB — GLUCOSE, CAPILLARY: Glucose-Capillary: 114 mg/dL — ABNORMAL HIGH (ref 70–99)

## 2023-10-29 LAB — CALCIUM, IONIZED: Calcium, Ionized, Serum: 7.9 mg/dL — ABNORMAL HIGH (ref 4.5–5.6)

## 2023-10-29 LAB — MAGNESIUM: Magnesium: 1.4 mg/dL — ABNORMAL LOW (ref 1.7–2.4)

## 2023-10-29 LAB — PHOSPHORUS: Phosphorus: 2.6 mg/dL (ref 2.5–4.6)

## 2023-10-29 MED ORDER — MAGNESIUM SULFATE 4 GM/100ML IV SOLN
4.0000 g | Freq: Once | INTRAVENOUS | Status: AC
  Administered 2023-10-29: 4 g via INTRAVENOUS
  Filled 2023-10-29: qty 100

## 2023-10-29 MED ORDER — CINACALCET HCL 30 MG PO TABS
30.0000 mg | ORAL_TABLET | Freq: Two times a day (BID) | ORAL | Status: DC
  Administered 2023-10-29 – 2023-10-30 (×2): 30 mg via ORAL
  Filled 2023-10-29 (×2): qty 1

## 2023-10-29 MED ORDER — LACTATED RINGERS IV SOLN: INTRAVENOUS | Status: AC

## 2023-10-29 MED ORDER — LACTATED RINGERS IV SOLN: INTRAVENOUS | Status: DC

## 2023-10-29 NOTE — Progress Notes (Signed)
 Calorie Count Note  Refer to follow up note 5/27 for full assessment.   48 hour calorie count ordered.  Diet: Regular, thin liquids  Supplements: Ensure Enlive, Magic cups  5/26: Breakfast: No meal tickets Lunch: 10% (75 kcal, 7 gm protein) Dinner: 20% (100 kcal, 7 gm protein) Supplements: 1 Ensure, 0 magic cups (350 kcal, 20 gm protein)  Total intake: 525 kcal (35% of minimum estimated needs)  34 gm protein (48% of minimum estimated needs)  Estimated Nutritional Needs:  Kcal:  1500-1700 kcal Protein:  70-90 gm Fluid:  >1.5L/day  NUTRITION DIAGNOSIS:    Severe Malnutrition related to acute illness as evidenced by severe muscle depletion, moderate muscle depletion, energy intake < or equal to 50% for > or equal to 5 days (In 3 months pt has lost 10 lbs, 6%.).   GOAL:    Patient will meet greater than or equal to 90% of their needs   Frederik Jansky, RD Registered Dietitian  See Amion for more information

## 2023-10-29 NOTE — TOC Initial Note (Signed)
 Transition of Care [place] [facility]) - Initial/Assessment Note    Patient Details  Name: Kimberly [facility] MRN: [id number] Date of Birth: January 31, 1933  Transition of Care [facility]ly [facility]) CM/SW Contact:    Kimberly Sharron A [place], LCSW Phone Number: 10/29/2023, 2:01 PM  Clinical Narrative:                  Pt is from [facility] for short term rehab. CSW spoke with pt's daughter Kimberly Webb to assist pt with completing assessment. She said that plan is for pt to return to rehab back at [facility] when she is stable. CSW reached out to [facility], can take pt back.  EDD tomorrow.    TOC will continue to follow.  Expected Discharge Plan: Skilled Nursing Facility Barriers to Discharge: Continued Medical Work up   Patient Goals and CMS Choice            Expected Discharge Plan and Services       Living arrangements for the past 2 months: Skilled Nursing Facility                                      Prior Living Arrangements/Services Living arrangements for the past 2 months: Skilled Nursing Facility Lives with:: Facility Resident          Need for Family Participation in Patient Care: Yes (Comment) Care giver support system in place?: Yes (comment) (pt's daughter Kimberly Webb)      Activities of Daily Living   ADL Screening (condition at time of admission) Independently performs ADLs?: No Does the patient have a NEW difficulty with bathing/dressing/toileting/self-feeding that is expected to last >3 days?: Yes (Initiates electronic notice to provider for possible OT consult) Does the patient have a NEW difficulty with getting in/out of bed, walking, or climbing stairs that is expected to last >3 days?: Yes (Initiates electronic notice to provider for possible PT consult) Does the patient have a NEW difficulty with communication that is expected to last >3 days?: No Is the patient deaf or have difficulty hearing?: No Does the patient have difficulty seeing, even when wearing glasses/contacts?: No Does  the patient have difficulty concentrating, remembering, or making decisions?: Yes  Permission Sought/Granted                  Emotional Assessment Appearance:: Appears older than stated age Attitude/Demeanor/Rapport: Unable to Assess Affect (typically observed): Unable to Assess Orientation: : Oriented to Self, Oriented to Place Alcohol / Substance Use: Not Applicable Psych Involvement: No (comment)  Admission diagnosis:  Hypercalcemia [E83.52] Acute cystitis without hematuria [N30.00] Patient Active Problem List   Diagnosis Date Noted   Abnormal CXR 10/26/2023   UTI (urinary tract infection) 10/26/2023   Acute metabolic encephalopathy 10/15/2023   Speech abnormality 10/14/2023   Essential hypertension 10/14/2023   Protein-calorie malnutrition, severe 10/04/2023   Failure to thrive in adult 10/03/2023   Hypercalcemia 10/03/2023   Generalized weakness 10/03/2023   Multiple closed fractures of ribs of left side 10/03/2023   AKI (acute kidney injury) (HCC) 10/02/2023   C1 cervical fracture (HCC) 07/21/2019   Neck fracture (HCC) 07/21/2019   Vaginal discharge 09/07/2016   Menopause 09/06/2016   History of herpes genitalis 04/04/2016   Skin mole 04/01/2013   STD (sexually transmitted disease)    Rectocele    Atrophic vaginitis    Osteoporosis    Hypercholesteremia    Arthritis    Hypothyroidism  PCP:  Kimberly Brady, MD Pharmacy:   [id number] - [place], [place] - [address] AT [address]OSS [address] [place] [address] Phone: [phone] Fax: [phone]  [facility] - [place], [place] - [address] [place] [address] Phone: [phone] Fax: [phone]  [facility] - [place], [place] - [address] [place] [address] Phone: [phone] Fax: [phone]     Social Drivers of Health (SDOH) Social History: SDOH Screenings    Food Insecurity: No Food Insecurity (10/28/2023)  Housing: Low Risk  (10/28/2023)  Transportation Needs: No Transportation Needs (10/28/2023)  Utilities: Not At Risk (10/28/2023)  Social Connections: Unknown (10/28/2023)  Tobacco Use: Medium Risk (10/26/2023)   SDOH Interventions:     Readmission Risk Interventions     No data to display

## 2023-10-29 NOTE — Progress Notes (Addendum)
 PROGRESS NOTE    Kimberly Webb  GNF:[id number] DOB: 01-27-1933 DOA: 10/26/2023 PCP: [name], MD    Hospital course:  This is third admission in May for this 89 yo F who was initially admitted with a fall, diagnosed with UTI and hypercalcemia and complicated by metabolic encephalopathy.  She was discharged to rehab 10/08/2023 and was readmitted 6 days later on 10/14/2023 for altered mental status and diagnosed with recurrent hypercalcemia and abnormal UA but insignificant growth on culture.  Extensive workup is consistent with primary hyperparathyroidism and patient was deemed not a surgical candidate.  She was treated with vitamin D  supplementation and continuation of pre-existing Prolia  with follow-up with outpatient endocrinology.  Patient was discharged 10/19/2023 and readmitted on 10/26/2023 with similar symptoms of fatigue/slugishness and altered mental status from SNF. W/u revealed recurrent hypercalcemia, and abnormal UA, possible recurrent UTI.  Patient was treated with calcitonin and started on ceftriaxone .  Urine cultures are ordered and pending.  Patient had been seen by palliative care on previous admission and family are aware of repercussions of significant decline in the past month but are hoping that she will continue to improve with control of hypercalcemia.  Patient has an appointment with outpatient [facility] on 11/01/2023.  5/27; hemodynamically stable but calcium started trending up again, added Cinacalcet 30 mg twice daily.  Urine culture with multiple species  Subjective: Patient was seen and examined today.  No new concern.  Exam: General.  Frail elderly lady, in no acute distress. Pulmonary.  Lungs clear bilaterally, normal respiratory effort. CV.  Regular rate and rhythm, no JVD, rub or murmur. Abdomen.  Soft, nontender, nondistended, BS positive. CNS.  Alert and oriented .  No focal neurologic deficit. Extremities.  No edema, no cyanosis, pulses intact and  symmetrical.  Assessment & Plan:  Fatigue and weakness Recurrent metabolic encephalopathy, mild FTT Protein-calorie malnutrition, severe Encephalopathy much improved Similar presentation to previous 2 admissions this month Most likely secondary to recurrent hyperparathyroidism and possible ongoing/recurrent UTI (versus asymptomatic bacteriuria) and likely intravascular volume depletion. Appreciate ongoing physical therapy involvement Reconsult dietary for ongoing calorie counts/support; they have followed this patient in the past.  Recurrent hypercalcemia Primary Hyperparathyroidsm--not considered surgical candidate Osteoporosis, s/p rib fractures after recent fall Calcium started trending up again. Patient is still not at her baseline, still with some confusion and fatigue -Starting her on Cinacalcet as she is not a surgical candidate  Echocardiogram with normal EF and grade 1 diastolic dysfunction.  A small pericardial effusion near the right ventricle  Another pressing issue is how to manage persistent hypercalcemia as an outpatient in this elderly patient who already has osteoporosis and is not a surgical candidate for parathyroidectomy. Patient does have an outpatient appointment with [facility] endocrinology on Friday, would like to discharge patient so she can make it to that appointment.  She had been discharged the last 2 times on vitamin D  and ongoing Prolia  however was readmitted with hypercalcemia,  - Continue with Cinacalcet which was added today-outpatient endocrinology will take over. - Hoping can discharge tomorrow so she can go to her endocrinology appointment  UTI versus asymptomatic bacteriuria Patient had persistently abnormal UA on last admission but cultures were negative. On her first admission in early May, patient grew out greater than 100K E. coli and Aerococcus species and 10K Proteus. UA today shows positive nitrates, small leuk esterase and many  bacteria. Unfortunately urine culture shows multiple species and recommends re-collection Urine culture with multiple species - Based on history-will  complete a 5-day course of antibiotic  Hypomagnesemia.  Magnesium  at 1.4 - Replace magnesium  and monitor  Abnormal chest x-ray Chest x-ray read as having possible retrocardiac infiltrates versus atelectasis Patient has no shortness of breath or cough, she is saturating at 97% on room air She is afebrile and has no leukocytosis Can repeat chest x-ray or order CT if patient were to develop any pulmonary symptoms  Essential hypertension Continue amlodipine  and lisinopril  per home doses  Dyslipidemia Change atorvastatin to simvastatin  given concurrent treatment with amlodipine   CKD 3 A Patient's creatinine is at baseline    Hypothyroidism Synthroid  at 100 mcg po q day  Goals for care Discussed patient's multiple medical problems and recurrent admissions with patient's daughter [name].  She notes that patient was fully participating in rehab as of last week and she hopes her mother will be able to return to that state.  She does understand that her mother has a multiple comorbid medical problems and has advanced age.  She has previously spoken with palliative care and at this time would like to with present plan of care.   DVT prophylaxis: SCD Code Status: Full Family Communication: Talked with daughter on phone. Disposition: Rehab    Diet Orders (From admission, onward)     Start     Ordered   10/28/23 0928  Diet regular Room service appropriate? Yes with Assist; Fluid consistency: Thin  Diet effective now       Question Answer Comment  Room service appropriate? Yes with Assist   Fluid consistency: Thin      10/28/23 0927            Objective: Vitals:   10/29/23 0012 10/29/23 0513 10/29/23 0802 10/29/23 1204  BP: (!) 149/76 (!) 145/66 (!) 164/62   Pulse: 64 65 62   Resp: 18 18    Temp: 98 F (36.7 C) 97.9 F (36.6  C) 98.3 F (36.8 C)   TempSrc: Oral Oral Oral   SpO2: 96% 96% 95%   Weight:    68.3 kg  Height:        Intake/Output Summary (Last 24 hours) at 10/29/2023 1549 Last data filed at 10/29/2023 0500 Gross per 24 hour  Intake 300 ml  Output 301 ml  Net -1 ml   Filed Weights   10/26/23 1816 10/29/23 1204  Weight: 75 kg 68.3 kg    Scheduled Meds:  acyclovir   400 mg Oral Daily   amLODipine   5 mg Oral Daily   atorvastatin  20 mg Oral QHS   cinacalcet  30 mg Oral BID WC   docusate sodium   100 mg Oral BID   feeding supplement  237 mL Oral BID BM   levothyroxine   100 mcg Oral q morning   lisinopril   10 mg Oral Daily   multivitamin with minerals  1 tablet Oral Daily   senna  1 tablet Oral BID   thiamine   100 mg Oral Daily   Continuous Infusions:  cefTRIAXone  (ROCEPHIN )  IV 1 g (10/28/23 1803)   lactated ringers       Nutritional status Signs/Symptoms: severe muscle depletion, moderate muscle depletion, energy intake < or equal to 50% for > or equal to 5 days (In 3 months pt has lost 10 lbs, 6%.) Percent weight loss: 6 % Interventions: Refer to RD note for recommendations Body mass index is 27.54 kg/m.  Data Reviewed:   CBC: Recent Labs  Lab 10/26/23 1751 10/27/23 0733  WBC 8.8 8.9  HGB 10.0* 9.8*  HCT 31.4* 30.3*  MCV 102.6* 100.0  PLT 288 288   Basic Metabolic Panel: Recent Labs  Lab 10/26/23 2026 10/27/23 0041 10/27/23 0733 10/27/23 1629 10/28/23 0048 10/29/23 0437  NA  --  140 141 140 139 140  K  --  3.9 4.2 4.1 3.9 3.8  CL  --  111 111 108 111 112*  CO2  --  24 23 22  20* 21*  GLUCOSE  --  112* 114* 109* 93 94  BUN  --  17 16 16 18 17   CREATININE  --  1.18* 1.16* 1.17* 1.21* 1.09*  CALCIUM  --  12.4* 11.7* 11.2* 10.6* 10.8*  MG 1.7  --   --   --   --  1.4*  PHOS 3.4  --  2.4*  --   --  2.6   GFR: Estimated Creatinine Clearance: 32.3 mL/min (A) (by C-G formula based on SCr of 1.09 mg/dL (H)). Liver Function Tests: Recent Labs  Lab 10/26/23 1751  10/27/23 0733  AST 21 15  ALT 14 13  ALKPHOS 63 66  BILITOT 0.5 0.3  PROT 6.6 6.5  ALBUMIN 3.4* 3.3*   No results for input(s): "LIPASE", "AMYLASE" in the last 168 hours. Recent Labs  Lab 10/26/23 2201  AMMONIA 19   Coagulation Profile: No results for input(s): "INR", "PROTIME" in the last 168 hours. Cardiac Enzymes: Recent Labs  Lab 10/26/23 2026  CKTOTAL 14*   BNP (last 3 results) No results for input(s): "PROBNP" in the last 8760 hours. HbA1C: No results for input(s): "HGBA1C" in the last 72 hours. CBG: Recent Labs  Lab 10/26/23 1749 10/27/23 2042  GLUCAP 107* 114*   Lipid Profile: No results for input(s): "CHOL", "HDL", "LDLCALC", "TRIG", "CHOLHDL", "LDLDIRECT" in the last 72 hours. Thyroid  Function Tests: Recent Labs    10/26/23 2026  TSH 1.661  FREET4 0.98   Anemia Panel: Recent Labs    10/26/23 2026 10/27/23 0041  VITAMINB12  --  688  FOLATE  --  26.7  FERRITIN 262  --   TIBC 287  --   IRON 30  --   RETICCTPCT 1.6  --    Sepsis Labs: Recent Labs  Lab 10/26/23 2026  PROCALCITON <0.10    Recent Results (from the past 240 hours)  Resp panel by RT-PCR (RSV, Flu A&B, Covid) Anterior Nasal Swab     Status: None   Collection Time: 10/26/23  6:17 PM   Specimen: Anterior Nasal Swab  Result Value Ref Range Status   SARS Coronavirus 2 by RT PCR NEGATIVE NEGATIVE Final   Influenza A by PCR NEGATIVE NEGATIVE Final   Influenza B by PCR NEGATIVE NEGATIVE Final    Comment: (NOTE) The Xpert Xpress SARS-CoV-2/FLU/RSV plus assay is intended as an aid in the diagnosis of influenza from Nasopharyngeal swab specimens and should not be used as a sole basis for treatment. Nasal washings and aspirates are unacceptable for Xpert Xpress SARS-CoV-2/FLU/RSV testing.  Fact Sheet for Patients: BloggerCourse.com  Fact Sheet for Healthcare Providers: SeriousBroker.it  This test is not yet approved or cleared by  the [place]  FDA and has been authorized for detection and/or diagnosis of SARS-CoV-2 by FDA under an Emergency Use Authorization (EUA). This EUA will remain in effect (meaning this test can be used) for the duration of the COVID-19 declaration under Section 564(b)(1) of the Act, 21 U.S.C. section 360bbb-3(b)(1), unless the authorization is terminated or revoked.     Resp Syncytial Virus by PCR NEGATIVE NEGATIVE Final  Comment: (NOTE) Fact Sheet for Patients: BloggerCourse.com  Fact Sheet for Healthcare Providers: SeriousBroker.it  This test is not yet approved or cleared by the [place]  FDA and has been authorized for detection and/or diagnosis of SARS-CoV-2 by FDA under an Emergency Use Authorization (EUA). This EUA will remain in effect (meaning this test can be used) for the duration of the COVID-19 declaration under Section 564(b)(1) of the Act, 21 U.S.C. section 360bbb-3(b)(1), unless the authorization is terminated or revoked.  Performed at [facility], [address], [place], [place] [address]   Urine Culture     Status: Abnormal   Collection Time: 10/26/23  6:29 PM   Specimen: Urine, Clean Catch  Result Value Ref Range Status   Specimen Description URINE, CLEAN CATCH  Final   Special Requests   Final    NONE Performed at [facility], [address], [place], [place] [address]    Culture MULTIPLE SPECIES PRESENT, SUGGEST RECOLLECTION (A)  Final   Report Status 10/28/2023 FINAL  Final     Radiology Studies: VAS US  LOWER EXTREMITY VENOUS (DVT) Result Date: 10/29/2023  Lower Venous DVT Study Patient Name:  Kimberly Webb  Date of Exam:   10/28/2023 Medical Rec #: [id number]        Accession #:    [id number] Date of Birth: [birth year]        Patient Gender: F Patient Age:   47 years Exam Location:  [facility] Procedure:      VAS US  LOWER EXTREMITY VENOUS (DVT) Referring Phys: Mardy Shall  DOUTOVA --------------------------------------------------------------------------------  Indications: Edema.  Limitations: Patient position - unable to position properly. Comparison Study: No previous exams Performing Technologist: Jody Hill RVT, RDMS  Examination Guidelines: A complete evaluation includes B-mode imaging, spectral Doppler, color Doppler, and power Doppler as needed of all accessible portions of each vessel. Bilateral testing is considered an integral part of a complete examination. Limited examinations for reoccurring indications may be performed as noted. The reflux portion of the exam is performed with the patient in reverse Trendelenburg.  +---------+---------------+---------+-----------+----------+--------------+ RIGHT    CompressibilityPhasicitySpontaneityPropertiesThrombus Aging +---------+---------------+---------+-----------+----------+--------------+ CFV      Full           Yes      Yes                                 +---------+---------------+---------+-----------+----------+--------------+ SFJ      Full                                                        +---------+---------------+---------+-----------+----------+--------------+ FV Prox  Full           Yes      Yes                                 +---------+---------------+---------+-----------+----------+--------------+ FV Mid   Full           Yes      Yes                                 +---------+---------------+---------+-----------+----------+--------------+ FV DistalFull  Yes      Yes                                 +---------+---------------+---------+-----------+----------+--------------+ PFV      Full                                                        +---------+---------------+---------+-----------+----------+--------------+ POP      Full           Yes      Yes                                  +---------+---------------+---------+-----------+----------+--------------+ PTV      Full                                                        +---------+---------------+---------+-----------+----------+--------------+ PERO     Full                                                        +---------+---------------+---------+-----------+----------+--------------+   +----+---------------+---------+-----------+----------+--------------+ LEFTCompressibilityPhasicitySpontaneityPropertiesThrombus Aging +----+---------------+---------+-----------+----------+--------------+ CFV Full           Yes      Yes                                 +----+---------------+---------+-----------+----------+--------------+     Summary: RIGHT: - There is no evidence of deep vein thrombosis in the lower extremity.  - No cystic structure found in the popliteal fossa.  LEFT: - No evidence of common femoral vein obstruction.   *See table(s) above for measurements and observations. Electronically signed by Runell Countryman on 10/29/2023 at 10:13:34 AM.    Final    ECHOCARDIOGRAM COMPLETE Result Date: 10/28/2023    ECHOCARDIOGRAM REPORT   Patient Name:   Kimberly Webb Date of Exam: 10/28/2023 Medical Rec #:  [id number]       Height:       62.0 in Accession #:    [id number]      Weight:       165.3 lb Date of Birth:  November 28, 1932       BSA:          1.763 m Patient Age:    89 years        BP:           136/55 mmHg Patient Gender: F               HR:           63 bpm. Exam Location:  Inpatient Procedure: 2D Echo, Cardiac Doppler and Color Doppler (Both Spectral and Color            Flow Doppler were utilized during procedure). Indications:    Malaise and fatigue  History:  Patient has no prior history of Echocardiogram examinations.  Sonographer:    Hersey Lorenzo RDCS Referring Phys: [id number] SROBONA TUBLU CHATTERJEE IMPRESSIONS  1. Left ventricular ejection fraction, by estimation, is 55%. The left ventricle has  normal function. The left ventricle has no regional wall motion abnormalities. Left ventricular diastolic parameters are consistent with Grade I diastolic dysfunction (impaired relaxation).  2. Right ventricular systolic function is normal. The right ventricular size is mildly enlarged. Tricuspid regurgitation signal is inadequate for assessing PA pressure.  3. The mitral valve is normal in structure. No evidence of mitral valve regurgitation. No evidence of mitral stenosis.  4. The aortic valve is tricuspid. There is mild calcification of the aortic valve. Aortic valve regurgitation is trivial. No aortic stenosis is present.  5. The inferior vena cava is normal in size with greater than 50% respiratory variability, suggesting right atrial pressure of 3 mmHg.  6. A small pericardial effusion is present. The pericardial effusion is localized near the right ventricle. FINDINGS  Left Ventricle: Left ventricular ejection fraction, by estimation, is 55%. The left ventricle has normal function. The left ventricle has no regional wall motion abnormalities. The left ventricular internal cavity size was normal in size. There is no left ventricular hypertrophy. Left ventricular diastolic parameters are consistent with Grade I diastolic dysfunction (impaired relaxation). Right Ventricle: The right ventricular size is mildly enlarged. No increase in right ventricular wall thickness. Right ventricular systolic function is normal. Tricuspid regurgitation signal is inadequate for assessing PA pressure. Left Atrium: Left atrial size was normal in size. Right Atrium: Right atrial size was normal in size. Pericardium: A small pericardial effusion is present. The pericardial effusion is localized near the right ventricle. Mitral Valve: The mitral valve is normal in structure. No evidence of mitral valve regurgitation. No evidence of mitral valve stenosis. Tricuspid Valve: The tricuspid valve is normal in structure. Tricuspid valve  regurgitation is trivial. Aortic Valve: The aortic valve is tricuspid. There is mild calcification of the aortic valve. Aortic valve regurgitation is trivial. No aortic stenosis is present. Pulmonic Valve: The pulmonic valve was normal in structure. Pulmonic valve regurgitation is not visualized. Aorta: The aortic root is normal in size and structure. Venous: The inferior vena cava is normal in size with greater than 50% respiratory variability, suggesting right atrial pressure of 3 mmHg. IAS/Shunts: No atrial level shunt detected by color flow Doppler.  LEFT VENTRICLE PLAX 2D LVIDd:         4.50 cm   Diastology LVIDs:         3.10 cm   LV e' medial:    6.53 cm/s LV PW:         1.00 cm   LV E/e' medial:  8.9 LV IVS:        0.90 cm   LV e' lateral:   5.77 cm/s LVOT diam:     2.10 cm   LV E/e' lateral: 10.1 LV SV:         62 LV SV Index:   35 LVOT Area:     3.46 cm  RIGHT VENTRICLE             IVC RV S prime:     14.10 cm/s  IVC diam: 1.80 cm TAPSE (M-mode): 2.0 cm LEFT ATRIUM             Index LA diam:        4.10 cm 2.33 cm/m LA Vol (A2C):   48.5 ml 27.51 ml/m LA  Vol (A4C):   33.1 ml 18.77 ml/m LA Biplane Vol: 42.3 ml 23.99 ml/m  AORTIC VALVE LVOT Vmax:   91.80 cm/s LVOT Vmean:  57.100 cm/s LVOT VTI:    0.178 m  AORTA Ao Root diam: 3.30 cm Ao Asc diam:  3.60 cm MITRAL VALVE MV Area (PHT): 2.11 cm     SHUNTS MV Decel Time: 359 msec     Systemic VTI:  0.18 m MV E velocity: 58.20 cm/s   Systemic Diam: 2.10 cm MV A velocity: 108.00 cm/s MV E/A ratio:  0.54 Dalton McleanMD Electronically signed by Archer Bear Signature Date/Time: 10/28/2023/3:38:20 PM    Final      LOS: 3 days   Time spent= 45 mins  This record has been created using Conservation officer, historic buildings. Errors have been sought and corrected,but may not always be located. Such creation errors do not reflect on the standard of care.   Luna Salinas, MD [facility]  If 7PM-7AM, please contact night-coverage  10/29/2023, 3:49 PM

## 2023-10-29 NOTE — NC FL2 (Signed)
 [place]  MEDICAID FL2 LEVEL OF CARE FORM     IDENTIFICATION  Patient Name: Kimberly Webb Birthdate: March 22, 1933 Sex: female Admission Date (Current Location): 10/26/2023  [facility] and [place] Number:  Producer, television/film/video and Address:  [facility], [address] [place], [place] [address]      Provider Number: [id number]  Attending Physician Name and Address:  Luna Salinas, MD  Relative Name and Phone Number:  Jacques Mattock (Daughter)  [phone]    Current Level of Care: Hospital Recommended Level of Care: Skilled Nursing Facility Prior Approval Number:    Date Approved/Denied:   PASRR Number: [id number]  Discharge Plan: SNF    Current Diagnoses: Patient Active Problem List   Diagnosis Date Noted   Abnormal CXR 10/26/2023   UTI (urinary tract infection) 10/26/2023   Acute metabolic encephalopathy 10/15/2023   Speech abnormality 10/14/2023   Essential hypertension 10/14/2023   Protein-calorie malnutrition, severe 10/04/2023   Failure to thrive in adult 10/03/2023   Hypercalcemia 10/03/2023   Generalized weakness 10/03/2023   Multiple closed fractures of ribs of left side 10/03/2023   AKI (acute kidney injury) (HCC) 10/02/2023   C1 cervical fracture (HCC) 07/21/2019   Neck fracture (HCC) 07/21/2019   Vaginal discharge 09/07/2016   Menopause 09/06/2016   History of herpes genitalis 04/04/2016   Skin mole 04/01/2013   STD (sexually transmitted disease)    Rectocele    Atrophic vaginitis    Osteoporosis    Hypercholesteremia    Arthritis    Hypothyroidism     Orientation RESPIRATION BLADDER Height & Weight     Self  Normal Incontinent, External catheter Weight: 165 lb 5.5 oz (75 kg) Height:  5\' 2"  (157.5 cm)  BEHAVIORAL SYMPTOMS/MOOD NEUROLOGICAL BOWEL NUTRITION STATUS      Incontinent Diet  AMBULATORY STATUS COMMUNICATION OF NEEDS Skin   Extensive Assist Verbally Other (Comment) (Wound / Incision (Open or Dehisced) 10/07/23  Sacrum Mid redness blanchable)                       Personal Care Assistance Level of Assistance  Bathing, Feeding, Dressing Bathing Assistance: Maximum assistance Feeding assistance: Limited assistance Dressing Assistance: Maximum assistance     Functional Limitations Info  Sight, Hearing, Speech Sight Info: Impaired Hearing Info: Impaired Speech Info: Adequate    SPECIAL CARE FACTORS FREQUENCY  PT (By licensed PT), OT (By licensed OT)     PT Frequency: 5x/week OT Frequency: 5x/week            Contractures Contractures Info: Not present    Additional Factors Info  Code Status, Allergies Code Status Info: FULL Allergies Info: No Known Allergies           Current Medications (10/29/2023):  This is the current hospital active medication list Current Facility-Administered Medications  Medication Dose Route Frequency Provider Last Rate Last Admin   acetaminophen  (TYLENOL ) tablet 650 mg  650 mg Oral Q6H PRN Doutova, Anastassia, MD   650 mg at 10/29/23 0017   Or   acetaminophen  (TYLENOL ) suppository 650 mg  650 mg Rectal Q6H PRN Doutova, Anastassia, MD       acyclovir  (ZOVIRAX ) tablet 400 mg  400 mg Oral Daily Doutova, Anastassia, MD   400 mg at 10/28/23 0946   amLODipine  (NORVASC ) tablet 5 mg  5 mg Oral Daily Doutova, Anastassia, MD   5 mg at 10/28/23 0945   atorvastatin (LIPITOR) tablet 20 mg  20 mg Oral QHS Leann Prom,  Srobona Tublu, MD   20 mg at 10/28/23 2140   cefTRIAXone  (ROCEPHIN ) 1 g in sodium chloride  0.9 % 100 mL IVPB  1 g Intravenous Q24H Doutova, Anastassia, MD 200 mL/hr at 10/28/23 1803 1 g at 10/28/23 1803   docusate sodium  (COLACE) capsule 100 mg  100 mg Oral BID Doutova, Anastassia, MD   100 mg at 10/28/23 2140   feeding supplement (ENSURE ENLIVE / ENSURE PLUS) liquid 237 mL  237 mL Oral BID BM Chatterjee, Srobona Tublu, MD   237 mL at 10/28/23 1514   levothyroxine  (SYNTHROID ) tablet 100 mcg  100 mcg Oral q morning Doutova, Anastassia, MD   100 mcg at  10/29/23 0526   lisinopril  (ZESTRIL ) tablet 10 mg  10 mg Oral Daily Chatterjee, Srobona Tublu, MD   10 mg at 10/28/23 1055   multivitamin with minerals tablet 1 tablet  1 tablet Oral Daily Chatterjee, Srobona Tublu, MD   1 tablet at 10/28/23 0946   ondansetron  (ZOFRAN ) tablet 4 mg  4 mg Oral Q6H PRN Doutova, Anastassia, MD       Or   ondansetron  (ZOFRAN ) injection 4 mg  4 mg Intravenous Q6H PRN Doutova, Anastassia, MD       polyethylene glycol (MIRALAX / GLYCOLAX) packet 17 g  17 g Oral Daily PRN Doutova, Anastassia, MD       senna (SENOKOT) tablet 8.6 mg  1 tablet Oral BID Doutova, Anastassia, MD   8.6 mg at 10/28/23 2140   thiamine  (VITAMIN B1) tablet 100 mg  100 mg Oral Daily Chatterjee, Srobona Tublu, MD   100 mg at 10/28/23 1610     Discharge Medications: Please see discharge summary for a list of discharge medications.  Relevant Imaging Results:  Relevant Lab Results:   Additional Information SSN: [id number]  Eduardo Wurth A [place], LCSW

## 2023-10-29 NOTE — Progress Notes (Signed)
 Nutrition Follow-up  DOCUMENTATION CODES:   Severe malnutrition in context of acute illness/injury  INTERVENTION:   Encourage PO intake Room service with assist  Feeding assistance  Liberalize diet to promote PO intake Continue Calorie count per MD Ensure Enlive po BID, each supplement provides 350 kcal and 20 grams of protein. Magic cup TID with meals, each supplement provides 290 kcal and 9 grams of protein - Prefers wild berry  100 mg Thiamine  x 7 days MVI with minerals daily   NUTRITION DIAGNOSIS:   Severe Malnutrition related to acute illness as evidenced by severe muscle depletion, moderate muscle depletion, energy intake < or equal to 50% for > or equal to 5 days (In 3 months pt has lost 10 lbs, 6%.).   GOAL:   Patient will meet greater than or equal to 90% of their needs   MONITOR:   PO intake, Supplement acceptance, Labs, Weight trends  REASON FOR ASSESSMENT:   Consult Assessment of nutrition requirement/status, Poor PO, Calorie Count  ASSESSMENT:   89 y.o with multiple readmissions within the last month due to hypercalcemia. Presents from SNF with fatigur and AMS, found to have recurrent hypercalcemia and possible UTI. PMH of HTN, HLD, hypothyroidism, osteoporosis, recurrent UTI, FTT, rib fractures, anemia, severe malnutrition.  Pt resting in bed, awake and alert. No family bedside. History obtained through chart review and some history obtained from pt.   Pt initially admitted to the ED 4/21/20205 after a fall and discharged to rehab on 10/08/2023. Then had a hospital admission 6 days later on 10/14/2023 for AMS, found to have recurrent hypercalcemia and abnormal UA. Discharged 10/19/2023 and readmitted 10/26/2023 with similar symptoms of fatigue and AMS. Found to have hypercalcemia and possible UTI.   Palliative has been following pt for GOC. Pt has had a significant decline in the last month with noted poor PO intake, and reduced mobility. Pt last seen by RD  10/04/2023 where it was noted pt had poor PO intake, weight loss and severe malnutrition.  Pt reports she has been losing weight for some time now due to decreased appetite. RD obtained new bed weight of 150 lbs. Weight appears stable until February of this year. In 3 months pt has lost 10 lbs, 6%. Suspect pt's weight is even lower due to edema on lower extremities. On exam pt with severe muscle and moderate fat wasting.   Pt did not remember what she had to eat yesterday. Only had 1 piece of french toast this morning. Does not like the food here. Awaiting a sandwich for lunch.     Admit weight: 68.3 kg  Current weight: 68.3 kg    Average Meal Intake: 5/26: 20% intake x 1 recorded meals  Nutritionally Relevant Medications: Scheduled Meds:  acyclovir   400 mg Oral Daily   amLODipine   5 mg Oral Daily   atorvastatin  20 mg Oral QHS   cinacalcet  30 mg Oral BID WC   docusate sodium   100 mg Oral BID   feeding supplement  237 mL Oral BID BM   levothyroxine   100 mcg Oral q morning   lisinopril   10 mg Oral Daily   multivitamin with minerals  1 tablet Oral Daily   senna  1 tablet Oral BID   thiamine   100 mg Oral Daily   Continuous Infusions:  cefTRIAXone  (ROCEPHIN )  IV 1 g (10/28/23 1803)   lactated ringers  75 mL/hr at 10/29/23 0948   lactated ringers       Labs Reviewed: Creatinine 1.09  Calcium 10.8  GHR 49 CBG ranges from 94-114 mg/dL over the last 24 hours HgbA1c 5.9    NUTRITION - FOCUSED PHYSICAL EXAM:  Flowsheet Row Most Recent Value  Orbital Region Moderate depletion  Upper Arm Region Mild depletion  Thoracic and Lumbar Region Mild depletion  Buccal Region Moderate depletion  Temple Region Moderate depletion  Clavicle Bone Region Moderate depletion  Clavicle and Acromion Bone Region Severe depletion  Scapular Bone Region Severe depletion  Dorsal Hand Moderate depletion  Patellar Region Unable to assess  [Fluid]  Anterior Thigh Region Unable to assess  [Fluid]   Posterior Calf Region Unable to assess  [Fluid]  Edema (RD Assessment) Moderate  Hair Reviewed  Eyes Reviewed  Mouth Reviewed  Skin Reviewed  Nails Reviewed       Diet Order:   Diet Order             Diet regular Room service appropriate? Yes with Assist; Fluid consistency: Thin  Diet effective now                   EDUCATION NEEDS:   Not appropriate for education at this time  Skin:  Skin Assessment: Skin Integrity Issues: Skin Integrity Issues:: Incisions Incisions: Sacrum  Last BM:  PTA  Height:   Ht Readings from Last 1 Encounters:  10/26/23 5\' 2"  (1.575 m)    Weight:   Wt Readings from Last 1 Encounters:  10/29/23 68.3 kg    Ideal Body Weight:  50 kg  BMI:  Body mass index is 27.54 kg/m.  Estimated Nutritional Needs:   Kcal:  1500-1700 kcal  Protein:  70-90 gm  Fluid:  >1.5L/day  Frederik Jansky, RD Registered Dietitian  See Amion for more information

## 2023-10-29 NOTE — Progress Notes (Signed)
 Physical Therapy Treatment Patient Details Name: Kimberly Webb MRN: [id number] DOB: July 25, 1932 Today's Date: 10/29/2023   History of Present Illness Pt is 89 year old presented to [facility] on  [facility]/23 for fatigue. Pt with acute cystitis and AKI. PMH - FTT, multiple rib fractures, OA, HTN, osteoporosis, TKR, possible dementia    PT Comments  Pt received in supine, agreeable to therapy session with encouragement. Pt with episode of urinary incontinence while standing and attempting to pivot OOB to [facility], PTA assisted her with hygiene at bedside and NT arrived to assist with posterior hygiene after new socks, briefs placed and pt legs cleaned. Pt needing up to modA to perform step pivot transfers with notable difficulty offloading LLE for stepping toward her L side and pt tending to heel toe shuffle to slide LLE while stepping toward her L. Pt c/o back and BLE pain/fatigue, limiting her to only a few steps at a time. Pt performed x3 step pivots which is increased activity compared with previous session. Patient will benefit from continued inpatient follow up therapy, <3 hours/day.    If plan is discharge home, recommend the following: A lot of help with walking and/or transfers;A lot of help with bathing/dressing/bathroom;Assist for transportation;Assistance with cooking/housework;Direct supervision/assist for financial management;Direct supervision/assist for medications management;Supervision due to cognitive status   Can travel by private vehicle     No  Equipment Recommendations  None recommended by PT    Recommendations for Other Services       Precautions / Restrictions Precautions Precautions: Fall;Other (comment) Recall of Precautions/Restrictions: Impaired Precaution/Restrictions Comments: urinary incontinence Restrictions Weight Bearing Restrictions Per Provider Order: No     Mobility  Bed Mobility Overal bed mobility: Needs Assistance Bed Mobility: Rolling, Sidelying to Sit, Sit  to Sidelying Rolling: Min assist, Used rails Sidelying to sit: Mod assist, HOB elevated, Used rails     Sit to sidelying: Used rails, Min assist, +2 for safety/equipment General bed mobility comments: cues for use of rail to assist, modA to raise trunk up and to advance her hips prior to standing, cues for log roll returning to supine to reduce back discomfort; totalA +2 for posterior supine scoot to HOB once back in supine.    Transfers Overall transfer level: Needs assistance Equipment used: Rolling walker (2 wheels) Transfers: Sit to/from Stand, Bed to chair/wheelchair/BSC Sit to Stand: Min assist, Mod assist, From elevated surface   Step pivot transfers: Mod assist       General transfer comment: minA from [facility] to RW, but increased to modA to stand from EOB<>RW x3 total reps, pt fatigues quickly in standing and only able to take a few steps at a time (to pivot to/from [facility] then to sidestep toward [facility]). Pt has much more difficulty lifting LLE off floor to step and tends to rotate LLE heel to toe to slide leg toward her L rather than abducting hip to side step. modA to weight shift as she fatigues and for RW assist.    Ambulation/Gait               General Gait Details: <42ft at a time prior to need to sit, see transfer section   Stairs             Wheelchair Mobility     Tilt Bed    Modified Rankin (Stroke Patients Only)       Balance Overall balance assessment: Needs assistance Sitting-balance support: No upper extremity supported, Feet supported Sitting balance-Leahy Scale: Fair Sitting balance -  Comments: EOB   Standing balance support: Bilateral upper extremity supported, During functional activity Standing balance-Leahy Scale: Poor Standing balance comment: BUE support and min to modA to transfer                            Communication Communication Communication: Impaired Factors Affecting Communication: Hearing impaired  Cognition  Arousal: Alert Behavior During Therapy: Flat affect   PT - Cognitive impairments: History of cognitive impairments, Memory, Problem solving, Awareness                       PT - Cognition Comments: Slow to process. Unable to state level of function at SNF Following commands: Impaired Following commands impaired: Only follows one step commands consistently, Follows one step commands with increased time    Cueing Cueing Techniques: Verbal cues, Tactile cues, Gestural cues  Exercises      General Comments General comments (skin integrity, edema, etc.): urinary incontinence upon standing, with leaking onto floor despite briefs with pad and purewick in place during transfer; new briefs and pad placed prior to standing from [facility] and PTA and NT assisted her with lower body hygiene assist and PTA placed clean socks on her feet after washing them before pt returned to supine. Pt given shower cap to wash her hair and encouraged to comb it herself after cap removed, once back resting in supine. HR WFL per tele      Pertinent Vitals/Pain Pain Assessment Pain Assessment: Faces Faces Pain Scale: Hurts even more Pain Location: LLE and back with prolonged standing Pain Intervention(s): Limited activity within patient's tolerance, Monitored during session, Repositioned    Home Living                          Prior Function            PT Goals (current goals can now be found in the care plan section) Acute Rehab PT Goals Patient Stated Goal: Not stated PT Goal Formulation: With patient Time For Goal Achievement: 11/10/23 Progress towards PT goals: Progressing toward goals    Frequency    Min 2X/week      PT Plan      Co-evaluation              AM-PAC PT "6 Clicks" Mobility   Outcome Measure  Help needed turning from your back to your side while in a flat bed without using bedrails?: A Lot Help needed moving from lying on your back to sitting on the side of  a flat bed without using bedrails?: A Lot Help needed moving to and from a bed to a chair (including a wheelchair)?: A Lot Help needed standing up from a chair using your arms (e.g., wheelchair or bedside chair)?: A Lot Help needed to walk in hospital room?: Total Help needed climbing 3-5 steps with a railing? : Total 6 Click Score: 10    End of Session Equipment Utilized During Treatment: Gait belt Activity Tolerance: Patient tolerated treatment well;Patient limited by pain;Patient limited by fatigue;Other (comment) (urinary incontinence in standing) Patient left: in bed;with call bell/phone within reach;with bed alarm set;with nursing/sitter in room;Other (comment) (NT assisting her to place new purewick) Nurse Communication: Mobility status;Other (comment) (IV beeping (charge RN arrived to fix it at end of session)) PT Visit Diagnosis: Unsteadiness on feet (R26.81);Other abnormalities of gait and mobility (R26.89);Muscle weakness (generalized) (M62.81);History of falling (  Z91.81)     Time: 9147-8295 PT Time Calculation (min) (ACUTE ONLY): 27 min  Charges:    $Therapeutic Activity: 23-37 mins PT General Charges $$ ACUTE PT VISIT: 1 Visit                     Deshanta Lady P., PTA Acute Rehabilitation Services Secure Chat Preferred 9a-5:30pm Office: [phone]    Mariel Shope [facility] 10/29/2023, 6:53 PM

## 2023-10-29 NOTE — NC FL2 (Signed)
 [place]  MEDICAID FL2 LEVEL OF CARE FORM     IDENTIFICATION  Patient Name: Kimberly Webb Birthdate: Mar 30, 1933 Sex: female Admission Date (Current Location): 10/26/2023  [facility] and [place] Number:  Producer, television/film/video and Address:  [facility] [place], [address], [place], [place] [address]      Provider Number: [id number]  Attending Physician Name and Address:  Luna Salinas, MD  Relative Name and Phone Number:  Jacques Mattock (Daughter)  [phone]    Current Level of Care: Hospital Recommended Level of Care: Skilled Nursing Facility Prior Approval Number:    Date Approved/Denied:   PASRR Number: [id number]  Discharge Plan: Home    Current Diagnoses: Patient Active Problem List   Diagnosis Date Noted   Abnormal CXR 10/26/2023   UTI (urinary tract infection) 10/26/2023   Acute metabolic encephalopathy 10/15/2023   Speech abnormality 10/14/2023   Essential hypertension 10/14/2023   Protein-calorie malnutrition, severe 10/04/2023   Failure to thrive in adult 10/03/2023   Hypercalcemia 10/03/2023   Generalized weakness 10/03/2023   Multiple closed fractures of ribs of left side 10/03/2023   AKI (acute kidney injury) (HCC) 10/02/2023   C1 cervical fracture (HCC) 07/21/2019   Neck fracture (HCC) 07/21/2019   Vaginal discharge 09/07/2016   Menopause 09/06/2016   History of herpes genitalis 04/04/2016   Skin mole 04/01/2013   STD (sexually transmitted disease)    Rectocele    Atrophic vaginitis    Osteoporosis    Hypercholesteremia    Arthritis    Hypothyroidism     Orientation RESPIRATION BLADDER Height & Weight     Self, Place  Normal Incontinent, External catheter Weight: 150 lb 9.2 oz (68.3 kg) (Bed) Height:  5\' 2"  (157.5 cm)  BEHAVIORAL SYMPTOMS/MOOD NEUROLOGICAL BOWEL NUTRITION STATUS      Incontinent Diet  AMBULATORY STATUS COMMUNICATION OF NEEDS Skin   Extensive Assist Verbally Other (Comment) (Wound / Incision (Open or  Dehisced) 10/07/23 Sacrum Mid redness blanchable)                       Personal Care Assistance Level of Assistance  Bathing, Feeding, Dressing Bathing Assistance: Maximum assistance Feeding assistance: Limited assistance Dressing Assistance: Maximum assistance     Functional Limitations Info  Sight, Hearing, Speech Sight Info: Impaired Hearing Info: Impaired Speech Info: Adequate    SPECIAL CARE FACTORS FREQUENCY  PT (By licensed PT), OT (By licensed OT)     PT Frequency: 5x/week OT Frequency: 5x/week            Contractures Contractures Info: Not present    Additional Factors Info  Code Status, Allergies Code Status Info: FULL Allergies Info: No Known Allergies           Current Medications (10/29/2023):  This is the current hospital active medication list Current Facility-Administered Medications  Medication Dose Route Frequency Provider Last Rate Last Admin   acetaminophen  (TYLENOL ) tablet 650 mg  650 mg Oral Q6H PRN Doutova, Anastassia, MD   650 mg at 10/29/23 0017   Or   acetaminophen  (TYLENOL ) suppository 650 mg  650 mg Rectal Q6H PRN Doutova, Anastassia, MD       acyclovir  (ZOVIRAX ) tablet 400 mg  400 mg Oral Daily Doutova, Anastassia, MD   400 mg at 10/29/23 0939   amLODipine  (NORVASC ) tablet 5 mg  5 mg Oral Daily Doutova, Anastassia, MD   5 mg at 10/29/23 0939   atorvastatin (LIPITOR) tablet 20 mg  20 mg Oral  QHS Chatterjee, Srobona Tublu, MD   20 mg at 10/28/23 2140   cefTRIAXone  (ROCEPHIN ) 1 g in sodium chloride  0.9 % 100 mL IVPB  1 g Intravenous Q24H Doutova, Anastassia, MD 200 mL/hr at 10/28/23 1803 1 g at 10/28/23 1803   cinacalcet (SENSIPAR) tablet 30 mg  30 mg Oral BID WC Amin, Sumayya, MD       docusate sodium  (COLACE) capsule 100 mg  100 mg Oral BID Doutova, Anastassia, MD   100 mg at 10/29/23 0939   feeding supplement (ENSURE ENLIVE / ENSURE PLUS) liquid 237 mL  237 mL Oral BID BM Chatterjee, Srobona Tublu, MD   237 mL at 10/29/23 1610    lactated ringers  infusion   Intravenous Continuous Luna Salinas, MD 75 mL/hr at 10/29/23 0948 New Bag at 10/29/23 0948   lactated ringers  infusion   Intravenous Continuous Amin, Sumayya, MD       levothyroxine  (SYNTHROID ) tablet 100 mcg  100 mcg Oral q morning Doutova, Anastassia, MD   100 mcg at 10/29/23 9604   lisinopril  (ZESTRIL ) tablet 10 mg  10 mg Oral Daily Chatterjee, Srobona Tublu, MD   10 mg at 10/29/23 0939   multivitamin with minerals tablet 1 tablet  1 tablet Oral Daily Chatterjee, Srobona Tublu, MD   1 tablet at 10/29/23 5409   ondansetron  (ZOFRAN ) tablet 4 mg  4 mg Oral Q6H PRN Doutova, Anastassia, MD       Or   ondansetron  (ZOFRAN ) injection 4 mg  4 mg Intravenous Q6H PRN Doutova, Anastassia, MD       polyethylene glycol (MIRALAX / GLYCOLAX) packet 17 g  17 g Oral Daily PRN Doutova, Anastassia, MD       senna (SENOKOT) tablet 8.6 mg  1 tablet Oral BID Doutova, Anastassia, MD   8.6 mg at 10/29/23 8119   thiamine  (VITAMIN B1) tablet 100 mg  100 mg Oral Daily Chatterjee, Srobona Tublu, MD   100 mg at 10/29/23 1478     Discharge Medications: Please see discharge summary for a list of discharge medications.  Relevant Imaging Results:  Relevant Lab Results:   Additional Information SSN: [id number]  Joncarlo Friberg A [place], LCSW

## 2023-10-30 DIAGNOSIS — G9341 Metabolic encephalopathy: Secondary | ICD-10-CM | POA: Diagnosis not present

## 2023-10-30 DIAGNOSIS — B964 Proteus (mirabilis) (morganii) as the cause of diseases classified elsewhere: Secondary | ICD-10-CM | POA: Diagnosis not present

## 2023-10-30 DIAGNOSIS — R627 Adult failure to thrive: Secondary | ICD-10-CM | POA: Diagnosis not present

## 2023-10-30 DIAGNOSIS — I10 Essential (primary) hypertension: Secondary | ICD-10-CM | POA: Diagnosis not present

## 2023-10-30 DIAGNOSIS — R471 Dysarthria and anarthria: Secondary | ICD-10-CM | POA: Diagnosis not present

## 2023-10-30 DIAGNOSIS — Z9181 History of falling: Secondary | ICD-10-CM | POA: Diagnosis not present

## 2023-10-30 DIAGNOSIS — N390 Urinary tract infection, site not specified: Secondary | ICD-10-CM | POA: Diagnosis not present

## 2023-10-30 DIAGNOSIS — Z7401 Bed confinement status: Secondary | ICD-10-CM | POA: Diagnosis not present

## 2023-10-30 DIAGNOSIS — M1990 Unspecified osteoarthritis, unspecified site: Secondary | ICD-10-CM | POA: Diagnosis not present

## 2023-10-30 DIAGNOSIS — S2242XD Multiple fractures of ribs, left side, subsequent encounter for fracture with routine healing: Secondary | ICD-10-CM | POA: Diagnosis not present

## 2023-10-30 DIAGNOSIS — R32 Unspecified urinary incontinence: Secondary | ICD-10-CM | POA: Diagnosis not present

## 2023-10-30 DIAGNOSIS — G9340 Encephalopathy, unspecified: Secondary | ICD-10-CM | POA: Diagnosis not present

## 2023-10-30 DIAGNOSIS — E43 Unspecified severe protein-calorie malnutrition: Secondary | ICD-10-CM | POA: Diagnosis not present

## 2023-10-30 DIAGNOSIS — N179 Acute kidney failure, unspecified: Secondary | ICD-10-CM | POA: Diagnosis not present

## 2023-10-30 DIAGNOSIS — R4701 Aphasia: Secondary | ICD-10-CM | POA: Diagnosis not present

## 2023-10-30 DIAGNOSIS — B9689 Other specified bacterial agents as the cause of diseases classified elsewhere: Secondary | ICD-10-CM | POA: Diagnosis not present

## 2023-10-30 DIAGNOSIS — M150 Primary generalized (osteo)arthritis: Secondary | ICD-10-CM | POA: Diagnosis not present

## 2023-10-30 DIAGNOSIS — R531 Weakness: Secondary | ICD-10-CM | POA: Diagnosis not present

## 2023-10-30 DIAGNOSIS — R1312 Dysphagia, oropharyngeal phase: Secondary | ICD-10-CM | POA: Diagnosis not present

## 2023-10-30 DIAGNOSIS — B370 Candidal stomatitis: Secondary | ICD-10-CM | POA: Diagnosis not present

## 2023-10-30 DIAGNOSIS — E213 Hyperparathyroidism, unspecified: Secondary | ICD-10-CM | POA: Diagnosis not present

## 2023-10-30 DIAGNOSIS — E785 Hyperlipidemia, unspecified: Secondary | ICD-10-CM | POA: Diagnosis not present

## 2023-10-30 DIAGNOSIS — N3000 Acute cystitis without hematuria: Secondary | ICD-10-CM | POA: Diagnosis not present

## 2023-10-30 DIAGNOSIS — E782 Mixed hyperlipidemia: Secondary | ICD-10-CM | POA: Diagnosis not present

## 2023-10-30 DIAGNOSIS — E8809 Other disorders of plasma-protein metabolism, not elsewhere classified: Secondary | ICD-10-CM | POA: Diagnosis not present

## 2023-10-30 DIAGNOSIS — E669 Obesity, unspecified: Secondary | ICD-10-CM | POA: Diagnosis not present

## 2023-10-30 DIAGNOSIS — R4182 Altered mental status, unspecified: Secondary | ICD-10-CM | POA: Diagnosis not present

## 2023-10-30 DIAGNOSIS — D649 Anemia, unspecified: Secondary | ICD-10-CM | POA: Diagnosis not present

## 2023-10-30 DIAGNOSIS — E210 Primary hyperparathyroidism: Secondary | ICD-10-CM | POA: Diagnosis not present

## 2023-10-30 DIAGNOSIS — E039 Hypothyroidism, unspecified: Secondary | ICD-10-CM | POA: Diagnosis not present

## 2023-10-30 DIAGNOSIS — M810 Age-related osteoporosis without current pathological fracture: Secondary | ICD-10-CM | POA: Diagnosis not present

## 2023-10-30 DIAGNOSIS — R5383 Other fatigue: Secondary | ICD-10-CM | POA: Diagnosis not present

## 2023-10-30 LAB — RENAL FUNCTION PANEL
Albumin: 2.9 g/dL — ABNORMAL LOW (ref 3.5–5.0)
Anion gap: 8 (ref 5–15)
BUN: 16 mg/dL (ref 8–23)
CO2: 21 mmol/L — ABNORMAL LOW (ref 22–32)
Calcium: 11.1 mg/dL — ABNORMAL HIGH (ref 8.9–10.3)
Chloride: 111 mmol/L (ref 98–111)
Creatinine, Ser: 0.93 mg/dL (ref 0.44–1.00)
GFR, Estimated: 59 mL/min — ABNORMAL LOW (ref >60)
Glucose, Bld: 100 mg/dL — ABNORMAL HIGH (ref 70–99)
Phosphorus: 3.2 mg/dL (ref 2.5–4.6)
Potassium: 3.8 mmol/L (ref 3.5–5.1)
Sodium: 140 mmol/L (ref 135–145)

## 2023-10-30 LAB — CALCIUM, IONIZED
Calcium, Ionized, Serum: 6 mg/dL — ABNORMAL HIGH (ref 4.5–5.6)
Calcium, Ionized, Serum: 6.3 mg/dL — ABNORMAL HIGH (ref 4.5–5.6)

## 2023-10-30 LAB — CBC
HCT: 27.6 % — ABNORMAL LOW (ref 36.0–46.0)
Hemoglobin: 9 g/dL — ABNORMAL LOW (ref 12.0–15.0)
MCH: 32.7 pg (ref 26.0–34.0)
MCHC: 32.6 g/dL (ref 30.0–36.0)
MCV: 100.4 fL — ABNORMAL HIGH (ref 80.0–100.0)
Platelets: 245 10*3/uL (ref 150–400)
RBC: 2.75 MIL/uL — ABNORMAL LOW (ref 3.87–5.11)
RDW: 12.7 % (ref 11.5–15.5)
WBC: 8.6 10*3/uL (ref 4.0–10.5)
nRBC: 0 % (ref 0.0–0.2)

## 2023-10-30 LAB — MAGNESIUM: Magnesium: 2.2 mg/dL (ref 1.7–2.4)

## 2023-10-30 MED ORDER — SULFAMETHOXAZOLE-TRIMETHOPRIM 800-160 MG PO TABS
1.0000 | ORAL_TABLET | Freq: Two times a day (BID) | ORAL | Status: AC
Start: 2023-10-30 — End: ?

## 2023-10-30 MED ORDER — LISINOPRIL 10 MG PO TABS
10.0000 mg | ORAL_TABLET | Freq: Every day | ORAL | Status: DC
  Administered 2023-10-30: 10 mg via ORAL
  Filled 2023-10-30: qty 1

## 2023-10-30 MED ORDER — AMLODIPINE BESYLATE 5 MG PO TABS
5.0000 mg | ORAL_TABLET | Freq: Every day | ORAL | Status: DC
  Administered 2023-10-30: 5 mg via ORAL
  Filled 2023-10-30: qty 1

## 2023-10-30 MED ORDER — CINACALCET HCL 30 MG PO TABS: 30.0000 mg | ORAL_TABLET | Freq: Two times a day (BID) | ORAL | Status: AC

## 2023-10-30 NOTE — Plan of Care (Signed)

## 2023-10-30 NOTE — Discharge Summary (Signed)
 Physician Discharge Summary   Patient: Kimberly Webb MRN: [id number] DOB: 1932-07-24  Admit date:     10/26/2023  Discharge date: 10/30/23  Discharge Physician: Jodeane Mulligan   PCP: [name], MD   Recommendations at discharge:    Pt to be discharged STR - [facility].   If you experience worsening fever, chills, chest pain, shortness of breath, or other concerning symptoms, please call your PCP or go to the emergency department immediately.  Discharge Diagnoses: Principal Problem:   Hypercalcemia Active Problems:   Hypercholesteremia   Hypothyroidism   History of herpes genitalis   AKI (acute kidney injury) (HCC)   Generalized weakness   Protein-calorie malnutrition, severe   Essential hypertension   Acute metabolic encephalopathy   Abnormal CXR   UTI (urinary tract infection)  Resolved Problems:   * No resolved hospital problems. [facility] Course:  This is third admission in May for this 89 yo F who was initially admitted with a fall, diagnosed with UTI and hypercalcemia and complicated by metabolic encephalopathy.  She was discharged to rehab 10/08/2023 and was readmitted 6 days later on 10/14/2023 for altered mental status and diagnosed with recurrent hypercalcemia and abnormal UA but insignificant growth on culture.  Extensive workup is consistent with primary hyperparathyroidism and patient was deemed not a surgical candidate.  She was treated with vitamin D  supplementation and continuation of pre-existing Prolia  with follow-up with outpatient endocrinology.  Patient was discharged 10/19/2023 and readmitted on 10/26/2023 with similar symptoms of fatigue/slugishness and altered mental status from SNF. W/u revealed recurrent hypercalcemia, and abnormal UA, possible recurrent UTI.  Patient was treated with calcitonin and started on ceftriaxone .  Urine cultures are ordered and pending.  Patient had been seen by palliative care on previous admission and family are aware of  repercussions of significant decline in the past month but are hoping that she will continue to improve with control of hypercalcemia.  Patient has an appointment with outpatient [facility] end[facility]ology on 11/01/2023.   5/27; hemodynamically stable but calcium started trending up again, added Cinacalcet 30 mg twice daily.  Urine culture with multiple species  Assessment and Plan:  Fatigue and weakness Recurrent metabolic encephalopathy, mild FTT Protein-calorie malnutrition, severe Encephalopathy much improved Similar presentation to previous 2 admissions this month Most likely secondary to recurrent hyperparathyroidism and possible ongoing/recurrent UTI (versus asymptomatic bacteriuria) and likely intravascular volume depletion. Resolved.   Recurrent hypercalcemia Primary Hyperparathyroidsm--not considered surgical candidate Osteoporosis, s/p rib fractures after recent fall -Marked improvement since admission.  Starting her on Cinacalcet as she is not a surgical candidate   Another pressing issue is how to manage persistent hypercalcemia as an outpatient in this elderly patient who already has osteoporosis and is not a surgical candidate for parathyroidectomy. Patient does have an outpatient appointment with [facility] endocrinology on Friday, would like to discharge patient so she can make it to that appointment.  She had been discharged the last 2 times on vitamin D  and ongoing Prolia  however was readmitted with hypercalcemia,  - Continue with Cinacalcet  -outpatient endocrinology will take over.   UTI versus asymptomatic bacteriuria -UA shows positive nitrates, small leuk esterase and many bacteria. Unfortunately urine culture shows multiple species and recommends re-collection - Based on history-will complete a 5-day course of antibiotic, transition to Bactrim for 3 more days on discharge.    Hypomagnesemia.  -resolved    Essential hypertension -Continue amlodipine  and lisinopril  per  home doses   Dyslipidemia -resume home regimin  CKD 3 A Patient's creatinine is at baseline    Hypothyroidism -Synthroid  at 100 mcg po q day   Goals for care Discussed patient's multiple medical problems and recurrent admissions with patient's daughter [name].  She notes that patient was fully participating in rehab as of last week and she hopes her mother will be able to return to that state.  She does understand that her mother has a multiple comorbid medical problems and has advanced age.  She has previously spoken with palliative care and at this time would like to with present plan of care.    Consultants: None Procedures performed: None Disposition: Skilled nursing facility Diet recommendation:  Discharge Diet Orders (From admission, onward)     Start     Ordered   10/30/23 0000  Diet - low sodium heart healthy        10/30/23 0957           Cardiac and Carb modified diet  DISCHARGE MEDICATION: Allergies as of 10/30/2023   No Known Allergies      Medication List     TAKE these medications    acetaminophen  650 MG CR tablet Commonly known as: TYLENOL  Take 650 mg by mouth every 8 (eight) hours as needed for pain.   acyclovir  400 MG tablet Commonly known as: ZOVIRAX  Take 400 mg by mouth daily.   amLODipine  5 MG tablet Commonly known as: NORVASC  Take 5 mg by mouth daily.   cinacalcet 30 MG tablet Commonly known as: SENSIPAR Take 1 tablet (30 mg total) by mouth 2 (two) times daily with a meal.   GLUCOSAMINE PO Take 1 tablet by mouth daily.   levothyroxine  100 MCG tablet Commonly known as: SYNTHROID  Take 100 mcg by mouth every morning.   lidocaine  5 % Commonly known as: LIDODERM  Place 1 patch onto the skin at bedtime. Remove & Discard patch within 12 hours or as directed by MD   lisinopril  10 MG tablet Commonly known as: ZESTRIL  Take 10 mg by mouth daily.   melatonin 3 MG Tabs tablet Take 6 mg by mouth at bedtime.   methocarbamol  500 MG  tablet Commonly known as: ROBAXIN  Take 1 tablet (500 mg total) by mouth every 8 (eight) hours as needed for muscle spasms (pain).   Milk of Magnesia 400 MG/5ML suspension Generic drug: magnesium  hydroxide Take 30 mLs by mouth daily as needed for moderate constipation.   MiraLax 17 g packet Generic drug: polyethylene glycol Take 17 g by mouth daily.   mirtazapine 7.5 MG tablet Commonly known as: REMERON Take 7.5 mg by mouth at bedtime.   MULTIPLE VITAMIN PO Take 1 tablet by mouth daily.   niacin  500 MG tablet Commonly known as: VITAMIN B3 Take 500 mg by mouth at bedtime.   nystatin  100000 UNIT/ML suspension Commonly known as: MYCOSTATIN  Take 4 mLs by mouth 4 (four) times daily.   ondansetron  4 MG tablet Commonly known as: ZOFRAN  Take 1 tablet (4 mg total) by mouth every 6 (six) hours as needed for nausea.   senna 8.6 MG Tabs tablet Commonly known as: SENOKOT Take 1 tablet by mouth at bedtime as needed for mild constipation.   simvastatin  40 MG tablet Commonly known as: ZOCOR  Take 40 mg by mouth at bedtime.   sulfamethoxazole-trimethoprim 800-160 MG tablet Commonly known as: Bactrim DS Take 1 tablet by mouth 2 (two) times daily.   thiamine  100 MG tablet Commonly known as: Vitamin B-1 Take 1 tablet (100 mg total) by mouth daily.  Zinc  Oxide 25 % Pste Apply 1 application  topically 2 (two) times daily. Apply to sacrum/peri area topically two times a day.        Follow-up Information     Gordy Lauber, MD Follow up.   Specialty: Endocrinology Contact information: [address] [place] [address] [phone]                 Discharge Exam: Kimberly Webb Weights   10/26/23 1816 10/29/23 1204  Weight: 75 kg 68.3 kg    GENERAL:  Alert, pleasant, no acute distress  HEENT:  EOMI CARDIOVASCULAR:  RRR, no murmurs appreciated RESPIRATORY:  Clear to auscultation, no wheezing, rales, or rhonchi GASTROINTESTINAL:  Soft, nontender,  nondistended EXTREMITIES:  No LE edema bilaterally NEURO:  No new focal deficits appreciated SKIN:  No rashes noted PSYCH:  Appropriate mood and affect     Condition at discharge: improving  The results of significant diagnostics from this hospitalization (including imaging, microbiology, ancillary and laboratory) are listed below for reference.   Imaging Studies: VAS US  LOWER EXTREMITY VENOUS (DVT) Result Date: 10/29/2023  Lower Venous DVT Study Patient Name:  ROSILAND SEN  Date of Exam:   10/28/2023 Medical Rec #: [id number]        Accession #:    [id number] Date of Birth: [birth year]        Patient Gender: F Patient Age:   44 years Exam Location:  [facility] Procedure:      VAS US  LOWER EXTREMITY VENOUS (DVT) Referring Phys: Mardy Shall DOUTOVA --------------------------------------------------------------------------------  Indications: Edema.  Limitations: Patient position - unable to position properly. Comparison Study: No previous exams Performing Technologist: Jody Hill RVT, RDMS  Examination Guidelines: A complete evaluation includes B-mode imaging, spectral Doppler, color Doppler, and power Doppler as needed of all accessible portions of each vessel. Bilateral testing is considered an integral part of a complete examination. Limited examinations for reoccurring indications may be performed as noted. The reflux portion of the exam is performed with the patient in reverse Trendelenburg.  +---------+---------------+---------+-----------+----------+--------------+ RIGHT    CompressibilityPhasicitySpontaneityPropertiesThrombus Aging +---------+---------------+---------+-----------+----------+--------------+ CFV      Full           Yes      Yes                                 +---------+---------------+---------+-----------+----------+--------------+ SFJ      Full                                                         +---------+---------------+---------+-----------+----------+--------------+ FV Prox  Full           Yes      Yes                                 +---------+---------------+---------+-----------+----------+--------------+ FV Mid   Full           Yes      Yes                                 +---------+---------------+---------+-----------+----------+--------------+ FV DistalFull  Yes      Yes                                 +---------+---------------+---------+-----------+----------+--------------+ PFV      Full                                                        +---------+---------------+---------+-----------+----------+--------------+ POP      Full           Yes      Yes                                 +---------+---------------+---------+-----------+----------+--------------+ PTV      Full                                                        +---------+---------------+---------+-----------+----------+--------------+ PERO     Full                                                        +---------+---------------+---------+-----------+----------+--------------+   +----+---------------+---------+-----------+----------+--------------+ LEFTCompressibilityPhasicitySpontaneityPropertiesThrombus Aging +----+---------------+---------+-----------+----------+--------------+ CFV Full           Yes      Yes                                 +----+---------------+---------+-----------+----------+--------------+     Summary: RIGHT: - There is no evidence of deep vein thrombosis in the lower extremity.  - No cystic structure found in the popliteal fossa.  LEFT: - No evidence of common femoral vein obstruction.   *See table(s) above for measurements and observations. Electronically signed by Runell Countryman on 10/29/2023 at 10:13:34 AM.    Final    ECHOCARDIOGRAM COMPLETE Result Date: 10/28/2023    ECHOCARDIOGRAM REPORT   Patient Name:   DAISIE HAFT Date  of Exam: 10/28/2023 Medical Rec #:  [id number]       Height:       62.0 in Accession #:    [id number]      Weight:       165.3 lb Date of Birth:  June 18, 1932       BSA:          1.763 m Patient Age:    89 years        BP:           136/55 mmHg Patient Gender: F               HR:           63 bpm. Exam Location:  Inpatient Procedure: 2D Echo, Cardiac Doppler and Color Doppler (Both Spectral and Color            Flow Doppler were utilized during procedure). Indications:    Malaise and fatigue  History:  Patient has no prior history of Echocardiogram examinations.  Sonographer:    Hersey Lorenzo RDCS Referring Phys: [id number] SROBONA TUBLU CHATTERJEE IMPRESSIONS  1. Left ventricular ejection fraction, by estimation, is 55%. The left ventricle has normal function. The left ventricle has no regional wall motion abnormalities. Left ventricular diastolic parameters are consistent with Grade I diastolic dysfunction (impaired relaxation).  2. Right ventricular systolic function is normal. The right ventricular size is mildly enlarged. Tricuspid regurgitation signal is inadequate for assessing PA pressure.  3. The mitral valve is normal in structure. No evidence of mitral valve regurgitation. No evidence of mitral stenosis.  4. The aortic valve is tricuspid. There is mild calcification of the aortic valve. Aortic valve regurgitation is trivial. No aortic stenosis is present.  5. The inferior vena cava is normal in size with greater than 50% respiratory variability, suggesting right atrial pressure of 3 mmHg.  6. A small pericardial effusion is present. The pericardial effusion is localized near the right ventricle. FINDINGS  Left Ventricle: Left ventricular ejection fraction, by estimation, is 55%. The left ventricle has normal function. The left ventricle has no regional wall motion abnormalities. The left ventricular internal cavity size was normal in size. There is no left ventricular hypertrophy. Left ventricular  diastolic parameters are consistent with Grade I diastolic dysfunction (impaired relaxation). Right Ventricle: The right ventricular size is mildly enlarged. No increase in right ventricular wall thickness. Right ventricular systolic function is normal. Tricuspid regurgitation signal is inadequate for assessing PA pressure. Left Atrium: Left atrial size was normal in size. Right Atrium: Right atrial size was normal in size. Pericardium: A small pericardial effusion is present. The pericardial effusion is localized near the right ventricle. Mitral Valve: The mitral valve is normal in structure. No evidence of mitral valve regurgitation. No evidence of mitral valve stenosis. Tricuspid Valve: The tricuspid valve is normal in structure. Tricuspid valve regurgitation is trivial. Aortic Valve: The aortic valve is tricuspid. There is mild calcification of the aortic valve. Aortic valve regurgitation is trivial. No aortic stenosis is present. Pulmonic Valve: The pulmonic valve was normal in structure. Pulmonic valve regurgitation is not visualized. Aorta: The aortic root is normal in size and structure. Venous: The inferior vena cava is normal in size with greater than 50% respiratory variability, suggesting right atrial pressure of 3 mmHg. IAS/Shunts: No atrial level shunt detected by color flow Doppler.  LEFT VENTRICLE PLAX 2D LVIDd:         4.50 cm   Diastology LVIDs:         3.10 cm   LV e' medial:    6.53 cm/s LV PW:         1.00 cm   LV E/e' medial:  8.9 LV IVS:        0.90 cm   LV e' lateral:   5.77 cm/s LVOT diam:     2.10 cm   LV E/e' lateral: 10.1 LV SV:         62 LV SV Index:   35 LVOT Area:     3.46 cm  RIGHT VENTRICLE             IVC RV S prime:     14.10 cm/s  IVC diam: 1.80 cm TAPSE (M-mode): 2.0 cm LEFT ATRIUM             Index LA diam:        4.10 cm 2.33 cm/m LA Vol (A2C):   48.5 ml 27.51 ml/m LA Vol (  A4C):   33.1 ml 18.77 ml/m LA Biplane Vol: 42.3 ml 23.99 ml/m  AORTIC VALVE LVOT Vmax:   91.80 cm/s  LVOT Vmean:  57.100 cm/s LVOT VTI:    0.178 m  AORTA Ao Root diam: 3.30 cm Ao Asc diam:  3.60 cm MITRAL VALVE MV Area (PHT): 2.11 cm     SHUNTS MV Decel Time: 359 msec     Systemic VTI:  0.18 m MV E velocity: 58.20 cm/s   Systemic Diam: 2.10 cm MV A velocity: 108.00 cm/s MV E/A ratio:  0.54 Dalton McleanMD Electronically signed by Archer Bear Signature Date/Time: 10/28/2023/3:38:20 PM    Final    DG Chest 2 View Result Date: 10/26/2023 CLINICAL DATA:  Fatigue since this morning.  Not as active as usual. EXAM: CHEST - 2 VIEW COMPARISON:  Radiograph 10/14/2023 FINDINGS: Stable cardiomediastinal silhouette. Aortic atherosclerotic calcification. Retrocardiac atelectasis or infiltrates. No pleural effusion or pneumothorax. Redemonstrated mildly displaced left ninth rib fracture. IMPRESSION: Retrocardiac atelectasis or infiltrates. Electronically Signed   By: Rozell Cornet M.D.   On: 10/26/2023 19:12   EEG adult Result Date: 10/15/2023 Arleene Lack, MD     10/15/2023  8:29 AM Patient Name: AINARA ELDRIDGE MRN: [id number] Epilepsy Attending: Arleene Lack Referring Physician/Provider: Date: 10/14/2023 Duration: 21.27 mins Patient history: 89yo F with speech disturbance. EEG to evaluate for seizure Level of alertness: Awake AEDs during EEG study: None Technical aspects: This EEG study was done with scalp electrodes positioned according to the 10-20 International system of electrode placement. Electrical activity was reviewed with band pass filter of 1-70Hz , sensitivity of 7 uV/mm, display speed of 30mm/sec with a 60Hz  notched filter applied as appropriate. EEG data were recorded continuously and digitally stored.  Video monitoring was available and reviewed as appropriate. Description: EEG showed continuous generalized polymorphic 3 to 7 Hz theta-delta slowing. Hyperventilation and photic stimulation were not performed.   ABNORMALITY - Continuous slow, generalized IMPRESSION: This study is suggestive of  moderate diffuse encephalopathy. No seizures or epileptiform discharges were seen throughout the recording. Arleene Lack   MR BRAIN WO CONTRAST Result Date: 10/15/2023 CLINICAL DATA:  Acute neurologic deficit EXAM: MRI HEAD WITHOUT CONTRAST TECHNIQUE: Multiplanar, multiecho pulse sequences of the brain and surrounding structures were obtained without intravenous contrast. COMPARISON:  None Available. FINDINGS: Brain: No acute infarct, mass effect or extra-axial collection. Fewer than 5 scattered chronic microhemorrhages in a nonspecific pattern. There is multifocal hyperintense T2-weighted signal within the white matter. Generalized volume loss. The midline structures are normal. Vascular: Normal flow voids. Skull and upper cervical spine: Normal calvarium and skull base. Visualized upper cervical spine and soft tissues are normal. Sinuses/Orbits:No paranasal sinus fluid levels or advanced mucosal thickening. No mastoid or middle ear effusion. Normal orbits. IMPRESSION: 1. No acute intracranial abnormality. 2. Findings of chronic small vessel ischemia and volume loss. Electronically Signed   By: Juanetta Nordmann M.D.   On: 10/15/2023 01:41   CT ANGIO HEAD NECK W WO CM (CODE STROKE) Addendum Date: 10/14/2023 ADDENDUM REPORT: 10/14/2023 19:49 ADDENDUM: Finding omitted from impression: Enlarged and heterogeneous left thyroid  lobe, extending into the mediastinum. A non-emergent thyroid  ultrasound is recommended for further evaluation. Reference: J Am Coll Radiol. 2015 Feb;12(2): 143-50. Prior right hemithyroidectomy. This addendum will be called to the ordering clinician or representative by the Radiologist Assistant, and communication documented in the PACS or [employer]. Electronically Signed   By: Bascom Lily D.O.   On: 10/14/2023 19:49   Result Date: 10/14/2023  CLINICAL DATA:  Provided history: Stroke, follow-up. EXAM: CT ANGIOGRAPHY HEAD AND NECK WITH AND WITHOUT CONTRAST TECHNIQUE: Multidetector CT  imaging of the head and neck was performed using the standard protocol during bolus administration of intravenous contrast. Multiplanar CT image reconstructions and MIPs were obtained to evaluate the vascular anatomy. Carotid stenosis measurements (when applicable) are obtained utilizing NASCET criteria, using the distal internal carotid diameter as the denominator. RADIATION DOSE REDUCTION: This exam was performed according to the departmental dose-optimization program which includes automated exposure control, adjustment of the mA and/or kV according to patient size and/or use of iterative reconstruction technique. CONTRAST:  75mL OMNIPAQUE  IOHEXOL  350 MG/ML SOLN COMPARISON:  Noncontrast head CT performed earlier today 10/14/2023. Cervical spine CT 10/02/2023. FINDINGS: CTA NECK FINDINGS Aortic arch: The left vertebral artery arises directly from the aortic arch. Atherosclerotic plaque within the visualized thoracic aorta and proximal major branch vessels of the neck. No hemodynamically significant innominate or proximal subclavian artery stenosis. Right carotid system: CCA and ICA patent within the neck without stenosis. Minimal atherosclerotic plaque within the proximal ICA. Partially retropharyngeal course of the cervical ICA. Left carotid system: CCA and ICA patent within the neck without stenosis. Minimal atherosclerotic plaque scattered within the CCA. Vertebral arteries: The left vertebral artery arises directly from the aortic arch. Venous reflux of contrast partially obscures the left vertebral artery V1 segment. Within this limitation, the vertebral arteries are patent within the neck without stenosis. Nonstenotic atherosclerotic plaque at the right vertebral artery origin. Skeleton: Known chronic minimally displaced non-healed fracture of the C1 posterior arch on the right. Known chronic, healed oblique fracture of the C2 body on the left. Spondylosis at the cervical and visualized upper thoracic  levels. No acute fracture or aggressive osseous lesion. Other neck: Enlarged and heterogeneous left thyroid  lobe, extending into the mediastinum. Upper chest: No consolidation within the imaged lung apices. Review of the MIP images confirms the above findings CTA HEAD FINDINGS Anterior circulation: The intracranial internal carotid arteries are patent. Atherosclerotic plaque within both vessels with no more than mild stenosis. The M1 middle cerebral arteries are patent. No M2 proximal branch occlusion or high-grade proximal stenosis. The anterior cerebral arteries are patent. No intracranial aneurysm is identified. Posterior circulation: The intracranial vertebral arteries are patent. The basilar artery is patent. The posterior cerebral arteries are patent. The right PCA is fetal in origin. A small left posterior communicating artery is present. Venous sinuses: Within the limitations of contrast timing, no convincing thrombus. Anatomic variants: As described. Review of the MIP images confirms the above findings No emergent large vessel occlusion identified. These results were communicated to Dr. Bonnita Buttner at 5:36 pmon 5/12/2025by text page via the [facility] messaging system. IMPRESSION: CTA neck: 1. The common carotid and internal carotid arteries are patent within the neck without stenosis. Mild atherosclerotic plaque bilaterally, as described. 2. Venous reflux of contrast partially obscures the left vertebral artery V1 segment. Within this limitation, the vertebral arteries are patent within the neck without stenosis. Non-stenotic atherosclerotic plaque at the right vertebral artery origin. 3. Aortic Atherosclerosis (ICD10-I70.0). 4. Known chronic C1 and C2 vertebral fractures, as described. CTA head: 1. No proximal intracranial large vessel occlusion or high-grade proximal arterial stenosis identified. 2. Atherosclerotic plaque within the intracranial internal carotid arteries with no more than mild stenosis.  Electronically Signed: By: Bascom Lily D.O. On: 10/14/2023 17:36   DG Hip Unilat W or Wo Pelvis 2-3 Views Right Result Date: 10/14/2023 CLINICAL DATA:  Hip pain code stroke EXAM:  DG HIP (WITH OR WITHOUT PELVIS) 2-3V RIGHT COMPARISON:  10/02/2023 FINDINGS: No fracture or malalignment. Mild right hip degenerative change. Pubic rami appear intact IMPRESSION: No acute osseous abnormality Electronically Signed   By: Esmeralda Hedge M.D.   On: 10/14/2023 19:15   DG HIP UNILAT WITH PELVIS 2-3 VIEWS LEFT Result Date: 10/14/2023 CLINICAL DATA:  Left-sided hip pain EXAM: DG HIP (WITH OR WITHOUT PELVIS) 2-3V LEFT COMPARISON:  09/23/2023 FINDINGS: SI joints are non widened. Pubic symphysis and rami appear intact. No fracture or malalignment. Mild hip degenerative change. Vascular calcifications. IMPRESSION: Mild degenerative change. Electronically Signed   By: Esmeralda Hedge M.D.   On: 10/14/2023 19:15   DG Chest 1 View Result Date: 10/14/2023 CLINICAL DATA:  Code stroke left-sided hip pain EXAM: CHEST  1 VIEW COMPARISON:  10/02/2023 FINDINGS: No acute airspace disease. Stable cardiomediastinal silhouette with aortic atherosclerosis. No pneumothorax. Subacute mildly displaced left ninth rib fracture as before IMPRESSION: No active disease. Subacute mildly displaced left ninth rib fracture. Electronically Signed   By: Esmeralda Hedge M.D.   On: 10/14/2023 19:14   CT HEAD CODE STROKE WO CONTRAST Result Date: 10/14/2023 CLINICAL DATA:  Code stroke. Neuro deficit, acute, stroke suspected. EXAM: CT HEAD WITHOUT CONTRAST TECHNIQUE: Contiguous axial images were obtained from the base of the skull through the vertex without intravenous contrast. RADIATION DOSE REDUCTION: This exam was performed according to the departmental dose-optimization program which includes automated exposure control, adjustment of the mA and/or kV according to patient size and/or use of iterative reconstruction technique. COMPARISON:  Head CT  10/02/2023. FINDINGS: Brain: Generalized cerebral atrophy. Chronic lacunar infarcts within/about the bilateral basal ganglia and within the thalami, not appreciably changed from the prior head CT of 10/02/2023. Background moderate patchy and ill-defined hypoattenuation within the cerebral white matter, nonspecific but compatible with chronic small vessel ischemic disease. There is no acute intracranial hemorrhage. No demarcated cortical infarct. No extra-axial fluid collection. No evidence of an intracranial mass. No midline shift. Vascular: No hyperdense vessel.  Atherosclerotic calcifications. Skull: No calvarial fracture or aggressive osseous lesion. Sinuses/Orbits: No mass or acute finding within the imaged orbits. No significant paranasal sinus disease at the imaged levels. ASPECTS [facility] Stroke Program Early CT Score) - Ganglionic level infarction (caudate, lentiform nuclei, internal capsule, insula, M1-M3 cortex): 7 - Supraganglionic infarction (M4-M6 cortex): 3 Total score (0-10 with 10 being normal): 10 (when discounting chronic infarcts). No evidence of an acute intracranial abnormality. These results were communicated to Dr. Bonnita Buttner at 5:18 pmon 5/12/2025by text page via the [facility] messaging system. IMPRESSION: 1. No evidence of an acute intracranial abnormality. 2. Parenchymal atrophy, chronic small vessel ischemic disease and chronic lacunar infarcts, as described. Electronically Signed   By: Bascom Lily D.O.   On: 10/14/2023 17:19   CT CHEST ABDOMEN PELVIS WO CONTRAST Result Date: 10/02/2023 CLINICAL DATA:  Blunt polytrauma. Patient fell 10 days ago. Bilateral weakness. Patient declined from walking with a walker to being wheelchair-bound over the last 10 days. EXAM: CT CHEST, ABDOMEN AND PELVIS WITHOUT CONTRAST TECHNIQUE: Multidetector CT imaging of the chest, abdomen and pelvis was performed following the standard protocol without IV contrast. RADIATION DOSE REDUCTION: This exam was performed  according to the departmental dose-optimization program which includes automated exposure control, adjustment of the mA and/or kV according to patient size and/or use of iterative reconstruction technique. COMPARISON:  Same day chest and pelvis radiographs FINDINGS: CT CHEST FINDINGS Cardiovascular: No pericardial effusion. Coronary artery and aortic atherosclerotic calcification. Evaluation for aortic  injury is limited without IV contrast. Mediastinum/Nodes: Trachea and esophagus are unremarkable. No mediastinal hematoma. Lungs/Pleura: Respiratory motion obscures detail. Hypoventilation changes in the lower lobes. Bilateral lower lobe scarring. No pleural effusion or pneumothorax. Musculoskeletal: Acute minimally displaced fractures of the left anterior 3rd-7th ribs. Displaced fracture of the left lateral ninth rib. Subacute or chronic fracture of the posterior left tenth rib. CT ABDOMEN PELVIS FINDINGS Hepatobiliary: Gallbladder sludge. No evidence of acute cholecystitis. Unremarkable noncontrast appearance of the liver. Pancreas: No acute abnormality. Spleen: Unremarkable. Adrenals/Urinary Tract: No adrenal hemorrhage or renal injury identified. Bladder is unremarkable. Stomach/Bowel: Normal caliber large and small bowel. Colonic diverticulosis without diverticulitis. No bowel wall thickening. Stomach is within normal limits. Vascular/Lymphatic: Aortic atherosclerosis. No enlarged abdominal or pelvic lymph nodes. Reproductive: Hysterectomy. Other: No free intraperitoneal fluid or air. Musculoskeletal: No acute fracture. IMPRESSION: 1. Acute minimally displaced fractures of the left anterior 3rd-7th ribs. Displaced fracture of the left lateral 9th rib. 2. No evidence of acute traumatic injury in the abdomen or pelvis. 3. Aortic Atherosclerosis (ICD10-I70.0). Electronically Signed   By: Rozell Cornet M.D.   On: 10/02/2023 19:17   CT HEAD WO CONTRAST Result Date: 10/02/2023 CLINICAL DATA:  Mental status change,  persistent or worsening. EXAM: CT HEAD WITHOUT CONTRAST TECHNIQUE: Contiguous axial images were obtained from the base of the skull through the vertex without intravenous contrast. RADIATION DOSE REDUCTION: This exam was performed according to the departmental dose-optimization program which includes automated exposure control, adjustment of the mA and/or kV according to patient size and/or use of iterative reconstruction technique. COMPARISON:  CT head without contrast 09/23/2023 at [facility] [employer]. FINDINGS: Brain: Remote lacunar infarcts are again noted within the basal ganglia and thalami bilaterally. No acute infarct, hemorrhage, or mass lesion is present. Moderate atrophy and white matter disease is stable. The ventricles are of normal size. No significant extraaxial fluid collection is present. The brainstem and cerebellum are within normal limits. Midline structures are within normal limits. Vascular: No hyperdense vessel or unexpected calcification. Skull: Calvarium is intact. No focal lytic or blastic lesions are present. No significant extracranial soft tissue lesion is present. Sinuses/Orbits: The paranasal sinuses and mastoid air cells are clear. Bilateral lens replacements are noted. Globes and orbits are otherwise unremarkable. IMPRESSION: 1. No acute intracranial abnormality or significant interval change. 2. Remote lacunar infarcts of the basal ganglia and thalami bilaterally. 3. Stable atrophy and white matter disease. This likely reflects the sequela of chronic microvascular ischemia. Electronically Signed   By: Audree Leas M.D.   On: 10/02/2023 19:05   CT Cervical Spine Wo Contrast Result Date: 10/02/2023 CLINICAL DATA:  Ataxia. Cervical trauma. Fall 10 days ago. Patient has declined from walking with a walker to now wheelchair-bound. EXAM: CT CERVICAL SPINE WITHOUT CONTRAST TECHNIQUE: Multidetector CT imaging of the cervical spine was performed without intravenous  contrast. Multiplanar CT image reconstructions were also generated. RADIATION DOSE REDUCTION: This exam was performed according to the departmental dose-optimization program which includes automated exposure control, adjustment of the mA and/or kV according to patient size and/or use of iterative reconstruction technique. COMPARISON:  CT of the cervical spine 01/11/2020 FINDINGS: Alignment: Slight degenerative anterolisthesis at C4-5 is similar to prior studies. Straightening of the normal cervical lordosis is present. Chronic leftward curvature of the lower cervical spine is noted. Skull base and vertebrae: A chronic non healed fracture is present in the right posterior arch of C1. No additional C1 fracture is present. A remote oblique healed fracture is  present in the body of C2 on the left. No acute fractures are present in the cervical spine. Soft tissues and spinal canal: No prevertebral fluid or swelling. No visible canal hematoma. Right thyroidectomy is noted. Disc levels: Chronic endplate degenerative changes are greatest at C3-4, C5-6 and C6-7. Facet spurring contributes 2 bilateral foraminal stenosis at C4-5. No focal osseous lesions are present. Upper chest: The lung apices are clear. The thoracic inlet is within normal limits. IMPRESSION: 1. No acute fracture or traumatic subluxation. 2. Chronic non healed fracture in the right posterior arch of C1. 3. Remote oblique healed fracture in the body of C2 on the left. 4. Chronic degenerative changes of the cervical spine as described. Electronically Signed   By: Audree Leas M.D.   On: 10/02/2023 19:01   DG Chest Port 1 View Result Date: 10/02/2023 CLINICAL DATA:  Fall 10 days ago.  Altered mental status. EXAM: PORTABLE CHEST 1 VIEW COMPARISON:  06/24/2023. FINDINGS: Stable cardiomediastinal silhouette. Aortic atherosclerosis. No focal consolidation, sizeable pleural effusion, or appreciable pneumothorax. Mildly displaced fracture of the left  lateral eighth rib, likely acute to subacute. Remote healed fracture of the right proximal humerus. IMPRESSION: Mildly displaced fracture of the left lateral eighth rib, likely acute to subacute. No appreciable pneumothorax. Electronically Signed   By: Mannie Seek M.D.   On: 10/02/2023 15:12   DG Pelvis Portable Result Date: 10/02/2023 CLINICAL DATA:  Fall 10 days ago with increasing weakness. EXAM: PORTABLE PELVIS 1-2 VIEWS COMPARISON:  09/23/2023 FINDINGS: Overlapping telemetry wires. No acute fracture or dislocation identified. Femoral heads are seated within the acetabula. Sacroiliac joints and pubic symphysis are anatomically aligned. Mild degenerative changes of the bilateral hips. Degenerative changes of the visualized lower lumbar spine. Vascular calcifications are noted. IMPRESSION: No acute osseous abnormality identified on single AP pelvic radiograph. Electronically Signed   By: Mannie Seek M.D.   On: 10/02/2023 15:08    Microbiology: Results for orders placed or performed during the hospital encounter of 10/26/23  Resp panel by RT-PCR (RSV, Flu A&B, Covid) Anterior Nasal Swab     Status: None   Collection Time: 10/26/23  6:17 PM   Specimen: Anterior Nasal Swab  Result Value Ref Range Status   SARS Coronavirus 2 by RT PCR NEGATIVE NEGATIVE Final   Influenza A by PCR NEGATIVE NEGATIVE Final   Influenza B by PCR NEGATIVE NEGATIVE Final    Comment: (NOTE) The Xpert Xpress SARS-CoV-2/FLU/RSV plus assay is intended as an aid in the diagnosis of influenza from Nasopharyngeal swab specimens and should not be used as a sole basis for treatment. Nasal washings and aspirates are unacceptable for Xpert Xpress SARS-CoV-2/FLU/RSV testing.  Fact Sheet for Patients: BloggerCourse.com  Fact Sheet for Healthcare Providers: SeriousBroker.it  This test is not yet approved or cleared by the [place]  FDA and has been authorized for  detection and/or diagnosis of SARS-CoV-2 by FDA under an Emergency Use Authorization (EUA). This EUA will remain in effect (meaning this test can be used) for the duration of the COVID-19 declaration under Section 564(b)(1) of the Act, 21 U.S.C. section 360bbb-3(b)(1), unless the authorization is terminated or revoked.     Resp Syncytial Virus by PCR NEGATIVE NEGATIVE Final    Comment: (NOTE) Fact Sheet for Patients: BloggerCourse.com  Fact Sheet for Healthcare Providers: SeriousBroker.it  This test is not yet approved or cleared by the [place]  FDA and has been authorized for detection and/or diagnosis of SARS-CoV-2 by FDA under an Emergency Use Authorization (EUA).  This EUA will remain in effect (meaning this test can be used) for the duration of the COVID-19 declaration under Section 564(b)(1) of the Act, 21 U.S.C. section 360bbb-3(b)(1), unless the authorization is terminated or revoked.  Performed at [facility], [address] [place], [place] [address]   Urine Culture     Status: Abnormal   Collection Time: 10/26/23  6:29 PM   Specimen: Urine, Clean Catch  Result Value Ref Range Status   Specimen Description URINE, CLEAN CATCH  Final   Special Requests   Final    NONE Performed at [facility], [address], [place], [place] [address]    Culture MULTIPLE SPECIES PRESENT, SUGGEST RECOLLECTION (A)  Final   Report Status 10/28/2023 FINAL  Final    Labs: CBC: Recent Labs  Lab 10/26/23 1751 10/27/23 0733 10/30/23 0625  WBC 8.8 8.9 8.6  HGB 10.0* 9.8* 9.0*  HCT 31.4* 30.3* 27.6*  MCV 102.6* 100.0 100.4*  PLT 288 288 245   Basic Metabolic Panel: Recent Labs  Lab 10/26/23 2026 10/27/23 0041 10/27/23 0733 10/27/23 1629 10/28/23 0048 10/29/23 0437 10/30/23 0625  NA  --    < > 141 140 139 140 140  K  --    < > 4.2 4.1 3.9 3.8 3.8  CL  --    < > 111 108 111 112* 111  CO2  --    < > 23 22  20* 21* 21*  GLUCOSE  --    < > 114* 109* 93 94 100*  BUN  --    < > 16 16 18 17 16   CREATININE  --    < > 1.16* 1.17* 1.21* 1.09* 0.93  CALCIUM  --    < > 11.7* 11.2* 10.6* 10.8* 11.1*  MG 1.7  --   --   --   --  1.4* 2.2  PHOS 3.4  --  2.4*  --   --  2.6 3.2   < > = values in this interval not displayed.   Liver Function Tests: Recent Labs  Lab 10/26/23 1751 10/27/23 0733 10/30/23 0625  AST 21 15  --   ALT 14 13  --   ALKPHOS 63 66  --   BILITOT 0.5 0.3  --   PROT 6.6 6.5  --   ALBUMIN 3.4* 3.3* 2.9*   CBG: Recent Labs  Lab 10/26/23 1749 10/27/23 2042  GLUCAP 107* 114*    Discharge time spent: 28 minutes.  Length of stay: 4 days  Signed: Jodeane Mulligan, DO [facility] 10/30/2023

## 2023-10-30 NOTE — Care Management Important Message (Addendum)
 Important Message  Patient Details  Name: Kimberly Webb MRN: [id number] Date of Birth: [birth year]   Important Message Given:  Yes - Medicare IM   Patient left prior to IM delivery will mail a copy to the patient home address.   Kimberly Webb 10/30/2023, 5:32 PM

## 2023-10-30 NOTE — TOC Transition Note (Signed)
 Transition of Care [facility]) - Discharge Note   Patient Details  Name: Kimberly Webb MRN: [id number] Date of Birth: 1933/02/22  Transition of Care [facility]) CM/SW Contact:  Nicandro Perrault A [place], LCSW Phone Number: 10/30/2023, 10:31 AM   Clinical Narrative:     Patient will DC to: [facility]  Anticipated DC date: 10/30/23  Family notified: Jacques Mattock  Transport by: Lyna Sandhoff      Per MD patient ready for DC to [facility]. RN, patient, patient's family, and facility notified of DC. Discharge Summary and FL2 sent to facility. RN to call report prior to discharge (Room 707, #[phone] ). DC packet on chart. Ambulance transport requested for patient.     CSW will sign off for now as social work intervention is no longer needed. Please consult us  again if new needs arise.   Final next level of care: Skilled Nursing Facility Barriers to Discharge: Barriers Resolved   Patient Goals and CMS Choice            Discharge Placement              Patient chooses bed at: [facility] Patient to be transferred to facility by: [facility] Name of family member notified: Jacques Mattock Patient and family notified of of transfer: 10/30/23  Discharge Plan and Services Additional resources added to the After Visit Summary for                                       Social Drivers of Health (SDOH) Interventions SDOH Screenings   Food Insecurity: No Food Insecurity (10/28/2023)  Housing: Low Risk  (10/28/2023)  Transportation Needs: No Transportation Needs (10/28/2023)  Utilities: Not At Risk (10/28/2023)  Social Connections: Unknown (10/28/2023)  Tobacco Use: Medium Risk (10/26/2023)     Readmission Risk Interventions     No data to display

## 2023-10-30 NOTE — Progress Notes (Addendum)
 Occupational Therapy Treatment Patient Details Name: SUBRENA DEVEREUX MRN: [id number] DOB: 20-Apr-1933 Today's Date: 10/30/2023   History of present illness Pt is 89 year old [facility]  10/26/23 for fatigue. Pt with acute cystitis and AKI. PMH - FTT, multiple rib fractures, OA, HTN, osteoporosis, TKR, possible dementia   OT comments  Pt seen with MT and is progressing toward goals, able to walk short household distance to door with x2 seated rest breaks and close chair follow min A +2 and use of RW. Pt min-mod A for toileting/clothing manipulation in standing to don/doff brief. Pt left in chair, NT notified to apply new purewick. Pt presenting with impairments listed below, will follow acutely. Patient will benefit from continued inpatient follow up therapy, <3 hours/day to maximize safety/ind with ADL/funcitonal mobility.       If plan is discharge home, recommend the following:  A lot of help with bathing/dressing/bathroom;Assistance with cooking/housework;Assist for transportation;Direct supervision/assist for medications management;A lot of help with walking and/or transfers;Help with stairs or ramp for entrance   Equipment Recommendations  Other (comment) (defer)    Recommendations for Other Services PT consult    Precautions / Restrictions Precautions Precautions: Fall;Other (comment) Recall of Precautions/Restrictions: Impaired Precaution/Restrictions Comments: urinary incontinence Restrictions Weight Bearing Restrictions Per Provider Order: No       Mobility Bed Mobility Overal bed mobility: Needs Assistance     Sidelying to sit: Min assist            Transfers Overall transfer level: Needs assistance Equipment used: Rolling walker (2 wheels) Transfers: Sit to/from Stand, Bed to chair/wheelchair/BSC Sit to Stand: Min assist, +2 physical assistance                 Balance Overall balance assessment: Needs assistance Sitting-balance support: No  upper extremity supported, Feet supported Sitting balance-Leahy Scale: Fair     Standing balance support: Bilateral upper extremity supported, During functional activity Standing balance-Leahy Scale: Poor Standing balance comment: BUE support and min to modA to transfer                           ADL either performed or assessed with clinical judgement   ADL Overall ADL's : Needs assistance/impaired                     Lower Body Dressing: Moderate assistance;Sit to/from stand;Sitting/lateral leans Lower Body Dressing Details (indicate cue type and reason): donning/doffing mesh underwear Toilet Transfer: Minimal assistance;+2 for physical assistance;Ambulation           Functional mobility during ADLs: Minimal assistance;Rolling walker (2 wheels)      Extremity/Trunk Assessment Upper Extremity Assessment Upper Extremity Assessment: Generalized weakness   Lower Extremity Assessment Lower Extremity Assessment: Defer to PT evaluation        Vision       Perception Perception Perception: Not tested   Praxis Praxis Praxis: Not tested   Communication Communication Communication: Impaired Factors Affecting Communication: Hearing impaired   Cognition Arousal: Alert Behavior During Therapy: Flat affect Cognition: Cognition impaired   Orientation impairments: Situation Awareness: Intellectual awareness impaired, Online awareness impaired   Attention impairment (select first level of impairment): Focused attention   OT - Cognition Comments: slowed processing/delayed responses throughout                 Following commands: Impaired Following commands impaired: Only follows one step commands consistently, Follows one step commands with increased  time      Cueing   Cueing Techniques: Verbal cues, Tactile cues, Gestural cues  Exercises      Shoulder Instructions       General Comments urinary incontinence with each standing attempt     Pertinent Vitals/ Pain       Pain Assessment Pain Assessment: No/denies pain  Home Living                                          Prior Functioning/Environment              Frequency  Min 1X/week        Progress Toward Goals  OT Goals(current goals can now be found in the care plan section)  Progress towards OT goals: Progressing toward goals  Acute Rehab OT Goals Patient Stated Goal: none stated OT Goal Formulation: With patient Time For Goal Achievement: 11/11/23 Potential to Achieve Goals: Good ADL Goals Pt Will Perform Upper Body Dressing: with contact guard assist;sitting Pt Will Perform Lower Body Dressing: with min assist;sitting/lateral leans;sit to/from stand Pt Will Transfer to Toilet: with min assist;ambulating;regular height toilet Pt Will Perform Toileting - Clothing Manipulation and hygiene: sitting/lateral leans;sit to/from stand;with contact guard assist  Plan      Co-evaluation                 AM-PAC OT "6 Clicks" Daily Activity     Outcome Measure   Help from another person eating meals?: A Little Help from another person taking care of personal grooming?: A Little Help from another person toileting, which includes using toliet, bedpan, or urinal?: A Lot Help from another person bathing (including washing, rinsing, drying)?: A Lot Help from another person to put on and taking off regular upper body clothing?: A Lot Help from another person to put on and taking off regular lower body clothing?: A Lot 6 Click Score: 14    End of Session Equipment Utilized During Treatment: Gait belt;Rolling walker (2 wheels)  OT Visit Diagnosis: Unsteadiness on feet (R26.81);Other abnormalities of gait and mobility (R26.89);Muscle weakness (generalized) (M62.81);History of falling (Z91.81);Other symptoms and signs involving cognitive function;Adult, failure to thrive (R62.7)   Activity Tolerance Patient limited by fatigue    Patient Left in chair;with call bell/phone within reach;with chair alarm set   Nurse Communication Mobility status        Time: 2952-8413 OT Time Calculation (min): 25 min  Charges: OT General Charges $OT Visit: 1 Visit OT Treatments $Self Care/Home Management : 8-22 mins $Therapeutic Activity: 8-22 mins  Rital Cavey K, OTD, OTR/L SecureChat Preferred Acute Rehab [phone]   Benedict Brain Koonce 10/30/2023, 11:38 AM

## 2023-10-31 DIAGNOSIS — E213 Hyperparathyroidism, unspecified: Secondary | ICD-10-CM | POA: Diagnosis not present

## 2023-10-31 DIAGNOSIS — R627 Adult failure to thrive: Secondary | ICD-10-CM | POA: Diagnosis not present

## 2023-11-01 DIAGNOSIS — E210 Primary hyperparathyroidism: Secondary | ICD-10-CM | POA: Diagnosis not present

## 2023-11-01 DIAGNOSIS — I10 Essential (primary) hypertension: Secondary | ICD-10-CM | POA: Diagnosis not present

## 2023-11-01 DIAGNOSIS — R32 Unspecified urinary incontinence: Secondary | ICD-10-CM | POA: Diagnosis not present

## 2023-11-01 DIAGNOSIS — M810 Age-related osteoporosis without current pathological fracture: Secondary | ICD-10-CM | POA: Diagnosis not present

## 2023-11-02 DIAGNOSIS — E669 Obesity, unspecified: Secondary | ICD-10-CM | POA: Diagnosis not present

## 2023-11-02 DIAGNOSIS — M150 Primary generalized (osteo)arthritis: Secondary | ICD-10-CM | POA: Diagnosis not present

## 2023-11-02 DIAGNOSIS — E039 Hypothyroidism, unspecified: Secondary | ICD-10-CM | POA: Diagnosis not present

## 2023-11-02 DIAGNOSIS — E782 Mixed hyperlipidemia: Secondary | ICD-10-CM | POA: Diagnosis not present

## 2023-11-02 LAB — T3: T3, Total: 98 ng/dL (ref 71–180)

## 2023-11-04 DIAGNOSIS — R627 Adult failure to thrive: Secondary | ICD-10-CM | POA: Diagnosis not present

## 2023-11-04 DIAGNOSIS — E213 Hyperparathyroidism, unspecified: Secondary | ICD-10-CM | POA: Diagnosis not present

## 2023-11-07 ENCOUNTER — Other Ambulatory Visit: Payer: Self-pay | Admitting: Obstetrics and Gynecology

## 2023-11-07 DIAGNOSIS — K5900 Constipation, unspecified: Secondary | ICD-10-CM | POA: Diagnosis not present

## 2023-11-07 DIAGNOSIS — B009 Herpesviral infection, unspecified: Secondary | ICD-10-CM

## 2023-11-07 NOTE — Telephone Encounter (Signed)
 Med refill request: acyclovir  400 mg Last AEX: 09/05/23 Last ov note says she may have her PCP take over prescribing acyclovir  Next AEX: none scheduled Last MMG (if hormonal med) n/a Refill authorized: acyclovir  400 mg. Please approve or deny as appropriate.

## 2023-11-07 NOTE — Telephone Encounter (Signed)
 Contacted patient at her home phone.  Female answered the phone and stated that Kimberly Webb had been moved to a Forensic scientist.

## 2023-11-13 DIAGNOSIS — F4320 Adjustment disorder, unspecified: Secondary | ICD-10-CM | POA: Diagnosis not present

## 2023-11-13 DIAGNOSIS — R627 Adult failure to thrive: Secondary | ICD-10-CM | POA: Diagnosis not present

## 2023-11-13 DIAGNOSIS — Z79899 Other long term (current) drug therapy: Secondary | ICD-10-CM | POA: Diagnosis not present

## 2023-11-13 DIAGNOSIS — F5101 Primary insomnia: Secondary | ICD-10-CM | POA: Diagnosis not present

## 2023-11-17 NOTE — Progress Notes (Unsigned)
 Assessment/Plan:   Memory impairment of unclear etiology, likely with symptomatic metabolic component (hypercalcemia) . Kimberly Webb is a very pleasant 89 y.o. RH female with a history of hypertension, hypothyroidism,  hyperlipidemia, hypothyroidism with secondary hyperparathyroidism, osteoporosis, multiple falls, hypercalcemia, and recent hospitalization with speech difficulties but with negative neuro workup, but felt to be due to toxic metabolic encephalopathy due to elevated Ca and possible UTI. She is seen today in follow up for memory loss.  Workup is in progress.  Today's MoCA 17/30.  In view of the current metabolic and endocrine abnormalities, it is felt prudent to wait on these levels to return to a normal value in order to determine the baseline of her memory deficits.  If after above issues are corrected and after neuropsych evaluation she continues to have worsening memory issues, we may consider starting antidementia medication.   Follow up in 6 months, after neuropsych evaluation. Neuropsych evaluation in 6 months for diagnostic clarity Follow up with endocrinology re: Hypercalcemia and hyperparathyroidism Recommend good control of her cardiovascular risk factors Continue to control mood as per PCP     Subjective:    This patient is accompanied in the office by her daughter who supplements the history.  Previous records as well as any outside records available were reviewed prior to todays visit    How long symptoms were present? Daughter reports that for the last year memory was sliding.  Took Prevagen but did not help . Daughter noticed changes for the last 6 months, not prior. STM worse than LTM.  repeats oneself?  Endorsed, with both stories and questions Disoriented when walking into a room? Denies    Leaving objects?  May misplace things but not in unusual places   Wandering behavior?  Denies.   Any personality changes since last visit?  I think she is  sweeter-daughter says Any worsening depression?:  Denies.    Hallucinations or paranoia?  Denies hallucinations.  Sometimes tells the workers at the rehab are angry at her, she feel a certain worker has it b in for her, but she does not know why Seizures? denies    Any sleep changes?  Sleeps well. Denies vivid dreams, REM behavior or sleepwalking   Sleep apnea?   Denies.   Any hygiene concerns? Denies.  Independent of bathing and dressing? Needs help to get dressed Does the patient needs help with medications?  Rehab is in charge. Prior to fall in April she was in charge.    Who is in charge of the finances? She was in charge, but after her fall in April, so her daughter took charge. Any changes in appetite?  Decreased, eats very light over the last 3 years.  Overchews because if too fast she will be nauseous .  She takes Ensure.  Does not drink enough water.  Patient have trouble swallowing? Denies.   Does the patient cook? No Any headaches?   denies   Any vision changes? Chronic back pain  denies   Ambulates with difficulty? Uses a walker for stability due to generalized weakness  Recent falls or head injuries? Has had frequent falls, around March 2025, last in April 2025.  Dropped my coffee cup and was trying to hold on to something and hit the concrete floor, with LOC .  Woke up in the  hospital with concussion and 3-4 fractured ribs .   Unilateral weakness, numbness or tingling? Sometimes she is clear today is better . Any tremors?  When I  try to move a coffee cup, but not always Any anosmia?  Denies   Any incontinence of urine?  Endorsed, when I stand up.    Any bowel dysfunction? Constipation Patient lives in [place]    Does the patient drive? No longer drives   Any history of alcohol or tobacco? NO  Family history of dementia? NO  Does the patient drive? No since April 2025 Retired [employer] operational officer   MRI brain personally reviewed  (10/14/23), remarkable for moderate chronic  microvascular changes and volume loss, mild hippocampal atrophy, no acute intracranial abnormalities   PREVIOUS MEDICATIONS:   CURRENT MEDICATIONS:  Outpatient Encounter Medications as of 11/18/2023  Medication Sig   acetaminophen  (TYLENOL ) 650 MG CR tablet Take 650 mg by mouth every 8 (eight) hours as needed for pain.   acyclovir  (ZOVIRAX ) 400 MG tablet Take 400 mg by mouth daily.   amLODipine  (NORVASC ) 5 MG tablet Take 5 mg by mouth daily.   cinacalcet  (SENSIPAR ) 30 MG tablet Take 1 tablet (30 mg total) by mouth 2 (two) times daily with a meal.   Glucosamine HCl (GLUCOSAMINE PO) Take 1 tablet by mouth daily.   levothyroxine  (SYNTHROID ) 100 MCG tablet Take 100 mcg by mouth every morning.   lidocaine  (LIDODERM ) 5 % Place 1 patch onto the skin at bedtime. Remove & Discard patch within 12 hours or as directed by MD   lisinopril  (ZESTRIL ) 10 MG tablet Take 10 mg by mouth daily.   magnesium  hydroxide (MILK OF MAGNESIA) 400 MG/5ML suspension Take 30 mLs by mouth daily as needed for moderate constipation.   melatonin 3 MG TABS tablet Take 6 mg by mouth at bedtime.   methocarbamol  (ROBAXIN ) 500 MG tablet Take 1 tablet (500 mg total) by mouth every 8 (eight) hours as needed for muscle spasms (pain).   mirtazapine (REMERON) 7.5 MG tablet Take 7.5 mg by mouth at bedtime.   MULTIPLE VITAMIN PO Take 1 tablet by mouth daily.   niacin  500 MG tablet Take 500 mg by mouth at bedtime.   nystatin  (MYCOSTATIN ) 100000 UNIT/ML suspension Take 4 mLs by mouth 4 (four) times daily.   ondansetron  (ZOFRAN ) 4 MG tablet Take 1 tablet (4 mg total) by mouth every 6 (six) hours as needed for nausea.   polyethylene glycol (MIRALAX ) 17 g packet Take 17 g by mouth daily.   senna (SENOKOT) 8.6 MG TABS tablet Take 1 tablet by mouth at bedtime as needed for mild constipation.   simvastatin  (ZOCOR ) 40 MG tablet Take 40 mg by mouth at bedtime.   sulfamethoxazole -trimethoprim  (BACTRIM  DS) 800-160 MG tablet Take 1 tablet by mouth 2  (two) times daily.   thiamine  (VITAMIN B-1) 100 MG tablet Take 1 tablet (100 mg total) by mouth daily.   Zinc  Oxide 25 % PSTE Apply 1 application  topically 2 (two) times daily. Apply to sacrum/peri area topically two times a day.   No facility-administered encounter medications on file as of 11/18/2023.        No data to display            11/18/2023   12:00 PM  Montreal Cognitive Assessment   Visuospatial/ Executive (0/5) 1  Naming (0/3) 3  Attention: Read list of digits (0/2) 2  Attention: Read list of letters (0/1) 1  Attention: Serial 7 subtraction starting at 100 (0/3) 1  Language: Repeat phrase (0/2) 2  Language : Fluency (0/1) 1  Abstraction (0/2) 0  Delayed Recall (0/5) 2  Orientation (0/6) 4  Total 17  Adjusted Score (based on education) 17    Objective:     PHYSICAL EXAMINATION:    VITALS:   Vitals:   11/18/23 0737  BP: 106/70  Pulse: 79  Resp: 20  SpO2: 96%  Height: 5' 2 (1.575 m)    GEN:  The patient appears stated age and is in NAD. HEENT:  Normocephalic, atraumatic.   Neurological examination:  General: NAD, well-groomed, appears stated age. Orientation: The patient is alert. Oriented to person, place and not to date Cranial nerves: There is good facial symmetry flat affect.  The speech is fluent and clear. No aphasia or dysarthria. Fund of knowledge is appropriate. Recent and remote memory are impaired. Attention and concentration are reduced. Able to name objects and repeat phrases.  Hearing is intact to conversational tone. Sensation: Sensation is intact to light touch throughout Motor: Strength is at least antigravity x4. DTR's 2/4 in UE/LE     Movement examination: Tone: There is normal tone in the UE/LE Abnormal movements:  no tremor.  No myoclonus.  No asterixis.   Coordination:  There is no decremation with RAM's. Normal finger to nose  Gait and Station: The patient has difficulty arising from deep seated chair, she does not wish to  stand up afraid to fall.  Gait and stride unable to be tested.  Thank you for allowing us  the opportunity to participate in the care of this nice patient. Please do not hesitate to contact us  for any questions or concerns.   Total time spent on today's visit was 60 minutes dedicated to this patient today, preparing to see patient, examining the patient, ordering tests and/or medications and counseling the patient, documenting clinical information in the EHR or other health record, independently interpreting results and communicating results to the patient/family, discussing treatment and goals, answering patient's questions and coordinating care.  Cc:  [name], MD  Tex Filbert 11/18/2023 12:17 PM

## 2023-11-18 ENCOUNTER — Ambulatory Visit (INDEPENDENT_AMBULATORY_CARE_PROVIDER_SITE_OTHER): Admitting: Physician Assistant

## 2023-11-18 ENCOUNTER — Ambulatory Visit

## 2023-11-18 ENCOUNTER — Encounter: Payer: Self-pay | Admitting: Physician Assistant

## 2023-11-18 VITALS — BP 106/70 | HR 79 | Resp 20 | Ht 62.0 in

## 2023-11-18 DIAGNOSIS — R413 Other amnesia: Secondary | ICD-10-CM | POA: Insufficient documentation

## 2023-11-18 NOTE — Patient Instructions (Signed)
 It was a pleasure to see you today at our office.   Recommendations:  Neurocognitive evaluation at our office   Follow up in 6  months  Recommend visiting the website :  Dementia Success [facility] to better understand some behaviors related to memory loss.  For psychiatric meds, mood meds: Please have your primary care physician manage these medications.  If you have any severe symptoms of a stroke, or other severe issues such as confusion,severe chills or fever, etc call 911 or go to the ER as you may need to be evaluated further For guidance regarding [facility] and if placement were needed at the facility, contact Social Worker tel: [phone]  For assessment of decision of mental capacity and competency:  Call Dr. Laverne Potter, geriatric psychiatrist at [phone] Counseling regarding caregiver distress, including caregiver depression, anxiety and issues regarding community resources, adult day care programs, adult living facilities, or memory care questions:  please contact your  Primary Doctor's Social Worker   FOR Memory  decline, memory medications: Call our office [phone]    [email or web address]   RECOMMENDATIONS FOR ALL PATIENTS WITH MEMORY PROBLEMS: 1. Continue to exercise (Recommend 30 minutes of walking everyday, or 3 hours every week) 2. Increase social interactions - continue going to [place] and enjoy social gatherings with friends and family 3. Eat healthy, avoid fried foods and eat more fruits and vegetables 4. Maintain adequate blood pressure, blood sugar, and blood cholesterol level. Reducing the risk of stroke and cardiovascular disease also helps promoting better memory. 5. Avoid stressful situations. Live a simple life and avoid aggravations. Organize your time and prepare for the next day in anticipation. 6. Sleep well, avoid any interruptions of sleep and avoid any distractions in the  bedroom that may interfere with adequate sleep quality 7. Avoid sugar, avoid sweets as there is a strong link between excessive sugar intake, diabetes, and cognitive impairment We discussed the Mediterranean diet, which has been shown to help patients reduce the risk of progressive memory disorders and reduces cardiovascular risk. This includes eating fish, eat fruits and green leafy vegetables, nuts like almonds and hazelnuts, walnuts, and also use olive oil. Avoid fast foods and fried foods as much as possible. Avoid sweets and sugar as sugar use has been linked to worsening of memory function.  There is always a concern of gradual progression of memory problems. If this is the case, then we may need to adjust level of care according to patient needs. Support, both to the patient and caregiver, should then be put into place.      You have been referred for a neuropsychological evaluation (i.e., evaluation of memory and thinking abilities). Please bring someone with you to this appointment if possible, as it is helpful for the doctor to hear from both you and another adult who knows you well. Please bring eyeglasses and hearing aids if you wear them.    The evaluation will take approximately 3 hours and has two parts:   The first part is a clinical interview with the neuropsychologist (Dr. Kitty Perkins or Dr. Donavon Fudge). During the interview, the neuropsychologist will speak with you and the individual you brought to the appointment.    The second part of the evaluation is testing with the doctor's technician Bernabe Brew or Burdette Carolin). During the testing, the technician will ask you to remember different types of material, solve problems, and answer some questionnaires. Your family member will not be present for this portion of the evaluation.  Please note: We must reserve several hours of the neuropsychologist's time and the psychometrician's time for your evaluation appointment. As such, there is a No-Show fee of  $100. If you are unable to attend any of your appointments, please contact our office as soon as possible to reschedule.      DRIVING: Regarding driving, in patients with progressive memory problems, driving will be impaired. We advise to have someone else do the driving if trouble finding directions or if minor accidents are reported. Independent driving assessment is available to determine safety of driving.   If you are interested in the driving assessment, you can contact the following:  [facility] in [place] [phone]  [facility] [phone]  [facility] [phone]  [facility] [phone] or [phone]   FALL PRECAUTIONS: Be cautious when walking. Scan the area for obstacles that may increase the risk of trips and falls. When getting up in the mornings, sit up at the edge of the bed for a few minutes before getting out of bed. Consider elevating the bed at the head end to avoid drop of blood pressure when getting up. Walk always in a well-lit room (use night lights in the walls). Avoid area rugs or power cords from appliances in the middle of the walkways. Use a walker or a cane if necessary and consider physical therapy for balance exercise. Get your eyesight checked regularly.  FINANCIAL OVERSIGHT: Supervision, especially oversight when making financial decisions or transactions is also recommended.  HOME SAFETY: Consider the safety of the kitchen when operating appliances like stoves, microwave oven, and blender. Consider having supervision and share cooking responsibilities until no longer able to participate in those. Accidents with firearms and other hazards in the house should be identified and addressed as well.   ABILITY TO BE LEFT ALONE: If patient is unable to contact 911 operator, consider using LifeLine, or when the need is there, arrange for someone to stay with patients. Smoking is a fire hazard, consider  supervision or cessation. Risk of wandering should be assessed by caregiver and if detected at any point, supervision and safe proof recommendations should be instituted.  MEDICATION SUPERVISION: Inability to self-administer medication needs to be constantly addressed. Implement a mechanism to ensure safe administration of the medications.      Mediterranean Diet A Mediterranean diet refers to food and lifestyle choices that are based on the traditions of countries located on the [employer]. This way of eating has been shown to help prevent certain conditions and improve outcomes for people who have chronic diseases, like kidney disease and heart disease. What are tips for following this plan? Lifestyle  Cook and eat meals together with your family, when possible. Drink enough fluid to keep your urine clear or pale yellow. Be physically active every day. This includes: Aerobic exercise like running or swimming. Leisure activities like gardening, walking, or housework. Get 7-8 hours of sleep each night. If recommended by your health care provider, drink red wine in moderation. This means 1 glass a day for nonpregnant women and 2 glasses a day for men. A glass of wine equals 5 oz (150 mL). Reading food labels  Check the serving size of packaged foods. For foods such as rice and pasta, the serving size refers to the amount of cooked product, not dry. Check the total fat in packaged foods. Avoid foods that have saturated fat or trans fats. Check the ingredients list for added sugars, such as corn syrup. Shopping  At the grocery store, buy most of your food from the areas near the walls of the store. This includes: Fresh fruits and vegetables (produce). Grains, beans, nuts, and seeds. Some of these may be available in unpackaged forms or large amounts (in bulk). Fresh seafood. Poultry and eggs. Low-fat dairy products. Buy whole ingredients instead of prepackaged foods. Buy fresh  fruits and vegetables in-season from local farmers markets. Buy frozen fruits and vegetables in resealable bags. If you do not have access to quality fresh seafood, buy precooked frozen shrimp or canned fish, such as tuna, salmon, or sardines. Buy small amounts of raw or cooked vegetables, salads, or olives from the deli or salad bar at your store. Stock your pantry so you always have certain foods on hand, such as olive oil, canned tuna, canned tomatoes, rice, pasta, and beans. Cooking  Cook foods with extra-virgin olive oil instead of using butter or other vegetable oils. Have meat as a side dish, and have vegetables or grains as your main dish. This means having meat in small portions or adding small amounts of meat to foods like pasta or stew. Use beans or vegetables instead of meat in common dishes like chili or lasagna. Experiment with different cooking methods. Try roasting or broiling vegetables instead of steaming or sauteing them. Add frozen vegetables to soups, stews, pasta, or rice. Add nuts or seeds for added healthy fat at each meal. You can add these to yogurt, salads, or vegetable dishes. Marinate fish or vegetables using olive oil, lemon juice, garlic, and fresh herbs. Meal planning  Plan to eat 1 vegetarian meal one day each week. Try to work up to 2 vegetarian meals, if possible. Eat seafood 2 or more times a week. Have healthy snacks readily available, such as: Vegetable sticks with hummus. Greek yogurt. Fruit and nut trail mix. Eat balanced meals throughout the week. This includes: Fruit: 2-3 servings a day Vegetables: 4-5 servings a day Low-fat dairy: 2 servings a day Fish, poultry, or lean meat: 1 serving a day Beans and legumes: 2 or more servings a week Nuts and seeds: 1-2 servings a day Whole grains: 6-8 servings a day Extra-virgin olive oil: 3-4 servings a day Limit red meat and sweets to only a few servings a month What are my food choices? Mediterranean  diet Recommended Grains: Whole-grain pasta. Brown rice. Bulgar wheat. Polenta. Couscous. Whole-wheat bread. Dwyane Glad. Vegetables: Artichokes. Beets. Broccoli. Cabbage. Carrots. Eggplant. Green beans. Chard. Kale. Spinach. Onions. Leeks. Peas. Squash. Tomatoes. Peppers. Radishes. Fruits: Apples. Apricots. Avocado. Berries. Bananas. Cherries. Dates. Figs. Grapes. Lemons. Melon. Oranges. Peaches. Plums. Pomegranate. Meats and other protein foods: Beans. Almonds. Sunflower seeds. Pine nuts. Peanuts. Cod. Salmon. Scallops. Shrimp. Tuna. Tilapia. Clams. Oysters. Eggs. Dairy: Low-fat milk. Cheese. Greek yogurt. Beverages: Water. Red wine. Herbal tea. Fats and oils: Extra virgin olive oil. Avocado oil. Grape seed oil. Sweets and desserts: [place] yogurt with honey. Baked apples. Poached pears. Trail mix. Seasoning and other foods: Basil. Cilantro. Coriander. Cumin. Mint. Parsley. Sage. Rosemary. Tarragon. Garlic. Oregano. Thyme. Pepper. Balsalmic vinegar. Tahini. Hummus. Tomato sauce. Olives. Mushrooms. Limit these Grains: Prepackaged pasta or rice dishes. Prepackaged cereal with added sugar. Vegetables: Deep fried potatoes (french fries). Fruits: Fruit canned in syrup. Meats and other protein foods: Beef. Pork. Lamb. Poultry with skin. Hot dogs. Helene Loader. Dairy: Ice cream. Sour cream. Whole milk. Beverages: Juice. Sugar-sweetened soft drinks. Beer. Liquor and spirits. Fats and oils: Butter. Canola oil. Vegetable oil. Beef fat (tallow). Lard. Sweets and desserts: Cookies.  Cakes. Pies. Candy. Seasoning and other foods: Mayonnaise. Premade sauces and marinades. The items listed may not be a complete list. Talk with your dietitian about what dietary choices are right for you. Summary The Mediterranean diet includes both food and lifestyle choices. Eat a variety of fresh fruits and vegetables, beans, nuts, seeds, and whole grains. Limit the amount of red meat and sweets that you eat. Talk with your  health care provider about whether it is safe for you to drink red wine in moderation. This means 1 glass a day for nonpregnant women and 2 glasses a day for men. A glass of wine equals 5 oz (150 mL). This information is not intended to replace advice given to you by your health care provider. Make sure you discuss any questions you have with your health care provider. Document Released: 01/12/2016 Document Revised: 02/14/2016 Document Reviewed: 01/12/2016 Elsevier Interactive Patient Education  2017 [employer].

## 2023-11-20 DIAGNOSIS — I10 Essential (primary) hypertension: Secondary | ICD-10-CM | POA: Diagnosis not present

## 2023-11-20 DIAGNOSIS — M1990 Unspecified osteoarthritis, unspecified site: Secondary | ICD-10-CM | POA: Diagnosis not present

## 2023-11-20 DIAGNOSIS — S2242XD Multiple fractures of ribs, left side, subsequent encounter for fracture with routine healing: Secondary | ICD-10-CM | POA: Diagnosis not present

## 2023-11-27 DIAGNOSIS — E039 Hypothyroidism, unspecified: Secondary | ICD-10-CM | POA: Diagnosis not present

## 2023-11-27 DIAGNOSIS — R0781 Pleurodynia: Secondary | ICD-10-CM | POA: Diagnosis not present

## 2023-11-27 DIAGNOSIS — E785 Hyperlipidemia, unspecified: Secondary | ICD-10-CM | POA: Diagnosis not present

## 2023-11-27 DIAGNOSIS — R627 Adult failure to thrive: Secondary | ICD-10-CM | POA: Diagnosis not present

## 2023-12-05 DIAGNOSIS — I10 Essential (primary) hypertension: Secondary | ICD-10-CM | POA: Diagnosis not present

## 2023-12-05 DIAGNOSIS — E210 Primary hyperparathyroidism: Secondary | ICD-10-CM | POA: Diagnosis not present

## 2023-12-05 DIAGNOSIS — M810 Age-related osteoporosis without current pathological fracture: Secondary | ICD-10-CM | POA: Diagnosis not present

## 2023-12-05 DIAGNOSIS — R32 Unspecified urinary incontinence: Secondary | ICD-10-CM | POA: Diagnosis not present

## 2023-12-11 DIAGNOSIS — E785 Hyperlipidemia, unspecified: Secondary | ICD-10-CM | POA: Diagnosis not present

## 2023-12-11 DIAGNOSIS — R627 Adult failure to thrive: Secondary | ICD-10-CM | POA: Diagnosis not present

## 2023-12-11 DIAGNOSIS — A6004 Herpesviral vulvovaginitis: Secondary | ICD-10-CM | POA: Diagnosis not present

## 2023-12-11 DIAGNOSIS — E039 Hypothyroidism, unspecified: Secondary | ICD-10-CM | POA: Diagnosis not present

## 2023-12-11 DIAGNOSIS — E213 Hyperparathyroidism, unspecified: Secondary | ICD-10-CM | POA: Diagnosis not present

## 2023-12-11 DIAGNOSIS — I10 Essential (primary) hypertension: Secondary | ICD-10-CM | POA: Diagnosis not present

## 2023-12-17 DIAGNOSIS — E890 Postprocedural hypothyroidism: Secondary | ICD-10-CM | POA: Diagnosis not present

## 2023-12-17 DIAGNOSIS — E539 Vitamin B deficiency, unspecified: Secondary | ICD-10-CM | POA: Diagnosis not present

## 2023-12-17 DIAGNOSIS — M1990 Unspecified osteoarthritis, unspecified site: Secondary | ICD-10-CM | POA: Diagnosis not present

## 2023-12-17 DIAGNOSIS — I252 Old myocardial infarction: Secondary | ICD-10-CM | POA: Diagnosis not present

## 2023-12-17 DIAGNOSIS — I10 Essential (primary) hypertension: Secondary | ICD-10-CM | POA: Diagnosis not present

## 2023-12-17 DIAGNOSIS — E7800 Pure hypercholesterolemia, unspecified: Secondary | ICD-10-CM | POA: Diagnosis not present

## 2023-12-17 DIAGNOSIS — Z96652 Presence of left artificial knee joint: Secondary | ICD-10-CM | POA: Diagnosis not present

## 2023-12-17 DIAGNOSIS — Z6824 Body mass index (BMI) 24.0-24.9, adult: Secondary | ICD-10-CM | POA: Diagnosis not present

## 2023-12-17 DIAGNOSIS — M800AXD Age-related osteoporosis with current pathological fracture, other site, subsequent encounter for fracture with routine healing: Secondary | ICD-10-CM | POA: Diagnosis not present

## 2023-12-17 DIAGNOSIS — D649 Anemia, unspecified: Secondary | ICD-10-CM | POA: Diagnosis not present

## 2023-12-17 DIAGNOSIS — Z87440 Personal history of urinary (tract) infections: Secondary | ICD-10-CM | POA: Diagnosis not present

## 2023-12-17 DIAGNOSIS — Z87891 Personal history of nicotine dependence: Secondary | ICD-10-CM | POA: Diagnosis not present

## 2023-12-17 DIAGNOSIS — R627 Adult failure to thrive: Secondary | ICD-10-CM | POA: Diagnosis not present

## 2023-12-17 DIAGNOSIS — E43 Unspecified severe protein-calorie malnutrition: Secondary | ICD-10-CM | POA: Diagnosis not present

## 2023-12-17 DIAGNOSIS — Z9181 History of falling: Secondary | ICD-10-CM | POA: Diagnosis not present

## 2023-12-17 DIAGNOSIS — R1312 Dysphagia, oropharyngeal phase: Secondary | ICD-10-CM | POA: Diagnosis not present

## 2023-12-19 DIAGNOSIS — R627 Adult failure to thrive: Secondary | ICD-10-CM | POA: Diagnosis not present

## 2023-12-19 DIAGNOSIS — I10 Essential (primary) hypertension: Secondary | ICD-10-CM | POA: Diagnosis not present

## 2023-12-19 DIAGNOSIS — E890 Postprocedural hypothyroidism: Secondary | ICD-10-CM | POA: Diagnosis not present

## 2023-12-19 DIAGNOSIS — M800AXD Age-related osteoporosis with current pathological fracture, other site, subsequent encounter for fracture with routine healing: Secondary | ICD-10-CM | POA: Diagnosis not present

## 2023-12-19 DIAGNOSIS — D649 Anemia, unspecified: Secondary | ICD-10-CM | POA: Diagnosis not present

## 2023-12-19 DIAGNOSIS — M1990 Unspecified osteoarthritis, unspecified site: Secondary | ICD-10-CM | POA: Diagnosis not present

## 2023-12-23 DIAGNOSIS — I10 Essential (primary) hypertension: Secondary | ICD-10-CM | POA: Diagnosis not present

## 2023-12-23 DIAGNOSIS — E890 Postprocedural hypothyroidism: Secondary | ICD-10-CM | POA: Diagnosis not present

## 2023-12-23 DIAGNOSIS — D649 Anemia, unspecified: Secondary | ICD-10-CM | POA: Diagnosis not present

## 2023-12-23 DIAGNOSIS — M800AXD Age-related osteoporosis with current pathological fracture, other site, subsequent encounter for fracture with routine healing: Secondary | ICD-10-CM | POA: Diagnosis not present

## 2023-12-23 DIAGNOSIS — M1990 Unspecified osteoarthritis, unspecified site: Secondary | ICD-10-CM | POA: Diagnosis not present

## 2023-12-23 DIAGNOSIS — R627 Adult failure to thrive: Secondary | ICD-10-CM | POA: Diagnosis not present

## 2023-12-30 DIAGNOSIS — M800AXD Age-related osteoporosis with current pathological fracture, other site, subsequent encounter for fracture with routine healing: Secondary | ICD-10-CM | POA: Diagnosis not present

## 2023-12-30 DIAGNOSIS — M1990 Unspecified osteoarthritis, unspecified site: Secondary | ICD-10-CM | POA: Diagnosis not present

## 2023-12-30 DIAGNOSIS — I10 Essential (primary) hypertension: Secondary | ICD-10-CM | POA: Diagnosis not present

## 2023-12-30 DIAGNOSIS — D649 Anemia, unspecified: Secondary | ICD-10-CM | POA: Diagnosis not present

## 2023-12-30 DIAGNOSIS — E890 Postprocedural hypothyroidism: Secondary | ICD-10-CM | POA: Diagnosis not present

## 2023-12-30 DIAGNOSIS — R627 Adult failure to thrive: Secondary | ICD-10-CM | POA: Diagnosis not present

## 2024-01-02 DIAGNOSIS — R627 Adult failure to thrive: Secondary | ICD-10-CM | POA: Diagnosis not present

## 2024-01-02 DIAGNOSIS — M1990 Unspecified osteoarthritis, unspecified site: Secondary | ICD-10-CM | POA: Diagnosis not present

## 2024-01-02 DIAGNOSIS — D649 Anemia, unspecified: Secondary | ICD-10-CM | POA: Diagnosis not present

## 2024-01-02 DIAGNOSIS — M150 Primary generalized (osteo)arthritis: Secondary | ICD-10-CM | POA: Diagnosis not present

## 2024-01-02 DIAGNOSIS — M800AXD Age-related osteoporosis with current pathological fracture, other site, subsequent encounter for fracture with routine healing: Secondary | ICD-10-CM | POA: Diagnosis not present

## 2024-01-02 DIAGNOSIS — E782 Mixed hyperlipidemia: Secondary | ICD-10-CM | POA: Diagnosis not present

## 2024-01-02 DIAGNOSIS — E890 Postprocedural hypothyroidism: Secondary | ICD-10-CM | POA: Diagnosis not present

## 2024-01-02 DIAGNOSIS — E669 Obesity, unspecified: Secondary | ICD-10-CM | POA: Diagnosis not present

## 2024-01-02 DIAGNOSIS — E039 Hypothyroidism, unspecified: Secondary | ICD-10-CM | POA: Diagnosis not present

## 2024-01-02 DIAGNOSIS — I10 Essential (primary) hypertension: Secondary | ICD-10-CM | POA: Diagnosis not present

## 2024-01-06 DIAGNOSIS — E890 Postprocedural hypothyroidism: Secondary | ICD-10-CM | POA: Diagnosis not present

## 2024-01-06 DIAGNOSIS — M800AXD Age-related osteoporosis with current pathological fracture, other site, subsequent encounter for fracture with routine healing: Secondary | ICD-10-CM | POA: Diagnosis not present

## 2024-01-06 DIAGNOSIS — D649 Anemia, unspecified: Secondary | ICD-10-CM | POA: Diagnosis not present

## 2024-01-06 DIAGNOSIS — R627 Adult failure to thrive: Secondary | ICD-10-CM | POA: Diagnosis not present

## 2024-01-06 DIAGNOSIS — M1990 Unspecified osteoarthritis, unspecified site: Secondary | ICD-10-CM | POA: Diagnosis not present

## 2024-01-06 DIAGNOSIS — I10 Essential (primary) hypertension: Secondary | ICD-10-CM | POA: Diagnosis not present

## 2024-01-09 DIAGNOSIS — R627 Adult failure to thrive: Secondary | ICD-10-CM | POA: Diagnosis not present

## 2024-01-09 DIAGNOSIS — M1990 Unspecified osteoarthritis, unspecified site: Secondary | ICD-10-CM | POA: Diagnosis not present

## 2024-01-09 DIAGNOSIS — M800AXD Age-related osteoporosis with current pathological fracture, other site, subsequent encounter for fracture with routine healing: Secondary | ICD-10-CM | POA: Diagnosis not present

## 2024-01-09 DIAGNOSIS — E890 Postprocedural hypothyroidism: Secondary | ICD-10-CM | POA: Diagnosis not present

## 2024-01-09 DIAGNOSIS — D649 Anemia, unspecified: Secondary | ICD-10-CM | POA: Diagnosis not present

## 2024-01-09 DIAGNOSIS — I10 Essential (primary) hypertension: Secondary | ICD-10-CM | POA: Diagnosis not present

## 2024-01-13 DIAGNOSIS — M1990 Unspecified osteoarthritis, unspecified site: Secondary | ICD-10-CM | POA: Diagnosis not present

## 2024-01-13 DIAGNOSIS — M800AXD Age-related osteoporosis with current pathological fracture, other site, subsequent encounter for fracture with routine healing: Secondary | ICD-10-CM | POA: Diagnosis not present

## 2024-01-13 DIAGNOSIS — D649 Anemia, unspecified: Secondary | ICD-10-CM | POA: Diagnosis not present

## 2024-01-13 DIAGNOSIS — I10 Essential (primary) hypertension: Secondary | ICD-10-CM | POA: Diagnosis not present

## 2024-01-13 DIAGNOSIS — E890 Postprocedural hypothyroidism: Secondary | ICD-10-CM | POA: Diagnosis not present

## 2024-01-13 DIAGNOSIS — R627 Adult failure to thrive: Secondary | ICD-10-CM | POA: Diagnosis not present

## 2024-01-16 DIAGNOSIS — E43 Unspecified severe protein-calorie malnutrition: Secondary | ICD-10-CM | POA: Diagnosis not present

## 2024-01-16 DIAGNOSIS — Z9181 History of falling: Secondary | ICD-10-CM | POA: Diagnosis not present

## 2024-01-16 DIAGNOSIS — M1990 Unspecified osteoarthritis, unspecified site: Secondary | ICD-10-CM | POA: Diagnosis not present

## 2024-01-16 DIAGNOSIS — Z87891 Personal history of nicotine dependence: Secondary | ICD-10-CM | POA: Diagnosis not present

## 2024-01-16 DIAGNOSIS — R627 Adult failure to thrive: Secondary | ICD-10-CM | POA: Diagnosis not present

## 2024-01-16 DIAGNOSIS — Z6824 Body mass index (BMI) 24.0-24.9, adult: Secondary | ICD-10-CM | POA: Diagnosis not present

## 2024-01-16 DIAGNOSIS — Z96652 Presence of left artificial knee joint: Secondary | ICD-10-CM | POA: Diagnosis not present

## 2024-01-16 DIAGNOSIS — D649 Anemia, unspecified: Secondary | ICD-10-CM | POA: Diagnosis not present

## 2024-01-16 DIAGNOSIS — R1312 Dysphagia, oropharyngeal phase: Secondary | ICD-10-CM | POA: Diagnosis not present

## 2024-01-16 DIAGNOSIS — E890 Postprocedural hypothyroidism: Secondary | ICD-10-CM | POA: Diagnosis not present

## 2024-01-16 DIAGNOSIS — I252 Old myocardial infarction: Secondary | ICD-10-CM | POA: Diagnosis not present

## 2024-01-16 DIAGNOSIS — E7800 Pure hypercholesterolemia, unspecified: Secondary | ICD-10-CM | POA: Diagnosis not present

## 2024-01-16 DIAGNOSIS — I10 Essential (primary) hypertension: Secondary | ICD-10-CM | POA: Diagnosis not present

## 2024-01-16 DIAGNOSIS — Z87440 Personal history of urinary (tract) infections: Secondary | ICD-10-CM | POA: Diagnosis not present

## 2024-01-16 DIAGNOSIS — E539 Vitamin B deficiency, unspecified: Secondary | ICD-10-CM | POA: Diagnosis not present

## 2024-01-16 DIAGNOSIS — M800AXD Age-related osteoporosis with current pathological fracture, other site, subsequent encounter for fracture with routine healing: Secondary | ICD-10-CM | POA: Diagnosis not present

## 2024-01-22 ENCOUNTER — Ambulatory Visit

## 2024-01-22 ENCOUNTER — Ambulatory Visit: Admitting: Physician Assistant

## 2024-01-22 DIAGNOSIS — R627 Adult failure to thrive: Secondary | ICD-10-CM | POA: Diagnosis not present

## 2024-01-22 DIAGNOSIS — M1990 Unspecified osteoarthritis, unspecified site: Secondary | ICD-10-CM | POA: Diagnosis not present

## 2024-01-22 DIAGNOSIS — I10 Essential (primary) hypertension: Secondary | ICD-10-CM | POA: Diagnosis not present

## 2024-01-22 DIAGNOSIS — D649 Anemia, unspecified: Secondary | ICD-10-CM | POA: Diagnosis not present

## 2024-01-22 DIAGNOSIS — E890 Postprocedural hypothyroidism: Secondary | ICD-10-CM | POA: Diagnosis not present

## 2024-01-22 DIAGNOSIS — M800AXD Age-related osteoporosis with current pathological fracture, other site, subsequent encounter for fracture with routine healing: Secondary | ICD-10-CM | POA: Diagnosis not present

## 2024-01-28 DIAGNOSIS — D649 Anemia, unspecified: Secondary | ICD-10-CM | POA: Diagnosis not present

## 2024-01-28 DIAGNOSIS — I10 Essential (primary) hypertension: Secondary | ICD-10-CM | POA: Diagnosis not present

## 2024-01-28 DIAGNOSIS — E890 Postprocedural hypothyroidism: Secondary | ICD-10-CM | POA: Diagnosis not present

## 2024-01-28 DIAGNOSIS — M800AXD Age-related osteoporosis with current pathological fracture, other site, subsequent encounter for fracture with routine healing: Secondary | ICD-10-CM | POA: Diagnosis not present

## 2024-01-28 DIAGNOSIS — M1990 Unspecified osteoarthritis, unspecified site: Secondary | ICD-10-CM | POA: Diagnosis not present

## 2024-01-28 DIAGNOSIS — R627 Adult failure to thrive: Secondary | ICD-10-CM | POA: Diagnosis not present

## 2024-01-30 DIAGNOSIS — R627 Adult failure to thrive: Secondary | ICD-10-CM | POA: Diagnosis not present

## 2024-01-30 DIAGNOSIS — E890 Postprocedural hypothyroidism: Secondary | ICD-10-CM | POA: Diagnosis not present

## 2024-01-30 DIAGNOSIS — M800AXD Age-related osteoporosis with current pathological fracture, other site, subsequent encounter for fracture with routine healing: Secondary | ICD-10-CM | POA: Diagnosis not present

## 2024-01-30 DIAGNOSIS — D649 Anemia, unspecified: Secondary | ICD-10-CM | POA: Diagnosis not present

## 2024-01-30 DIAGNOSIS — M1990 Unspecified osteoarthritis, unspecified site: Secondary | ICD-10-CM | POA: Diagnosis not present

## 2024-01-30 DIAGNOSIS — I10 Essential (primary) hypertension: Secondary | ICD-10-CM | POA: Diagnosis not present

## 2024-02-02 DIAGNOSIS — E782 Mixed hyperlipidemia: Secondary | ICD-10-CM | POA: Diagnosis not present

## 2024-02-02 DIAGNOSIS — E669 Obesity, unspecified: Secondary | ICD-10-CM | POA: Diagnosis not present

## 2024-02-02 DIAGNOSIS — E039 Hypothyroidism, unspecified: Secondary | ICD-10-CM | POA: Diagnosis not present

## 2024-02-02 DIAGNOSIS — M150 Primary generalized (osteo)arthritis: Secondary | ICD-10-CM | POA: Diagnosis not present

## 2024-02-06 DIAGNOSIS — M1990 Unspecified osteoarthritis, unspecified site: Secondary | ICD-10-CM | POA: Diagnosis not present

## 2024-02-06 DIAGNOSIS — D649 Anemia, unspecified: Secondary | ICD-10-CM | POA: Diagnosis not present

## 2024-02-06 DIAGNOSIS — R627 Adult failure to thrive: Secondary | ICD-10-CM | POA: Diagnosis not present

## 2024-02-06 DIAGNOSIS — I10 Essential (primary) hypertension: Secondary | ICD-10-CM | POA: Diagnosis not present

## 2024-02-06 DIAGNOSIS — E890 Postprocedural hypothyroidism: Secondary | ICD-10-CM | POA: Diagnosis not present

## 2024-02-06 DIAGNOSIS — M800AXD Age-related osteoporosis with current pathological fracture, other site, subsequent encounter for fracture with routine healing: Secondary | ICD-10-CM | POA: Diagnosis not present

## 2024-02-07 ENCOUNTER — Ambulatory Visit: Admitting: "Endocrinology

## 2024-02-10 DIAGNOSIS — I10 Essential (primary) hypertension: Secondary | ICD-10-CM | POA: Diagnosis not present

## 2024-02-10 DIAGNOSIS — M800AXD Age-related osteoporosis with current pathological fracture, other site, subsequent encounter for fracture with routine healing: Secondary | ICD-10-CM | POA: Diagnosis not present

## 2024-02-10 DIAGNOSIS — E890 Postprocedural hypothyroidism: Secondary | ICD-10-CM | POA: Diagnosis not present

## 2024-02-10 DIAGNOSIS — R627 Adult failure to thrive: Secondary | ICD-10-CM | POA: Diagnosis not present

## 2024-02-10 DIAGNOSIS — D649 Anemia, unspecified: Secondary | ICD-10-CM | POA: Diagnosis not present

## 2024-02-10 DIAGNOSIS — M1990 Unspecified osteoarthritis, unspecified site: Secondary | ICD-10-CM | POA: Diagnosis not present

## 2024-02-12 DIAGNOSIS — M800AXD Age-related osteoporosis with current pathological fracture, other site, subsequent encounter for fracture with routine healing: Secondary | ICD-10-CM | POA: Diagnosis not present

## 2024-02-12 DIAGNOSIS — D649 Anemia, unspecified: Secondary | ICD-10-CM | POA: Diagnosis not present

## 2024-02-12 DIAGNOSIS — R627 Adult failure to thrive: Secondary | ICD-10-CM | POA: Diagnosis not present

## 2024-02-12 DIAGNOSIS — I10 Essential (primary) hypertension: Secondary | ICD-10-CM | POA: Diagnosis not present

## 2024-02-12 DIAGNOSIS — M1990 Unspecified osteoarthritis, unspecified site: Secondary | ICD-10-CM | POA: Diagnosis not present

## 2024-02-12 DIAGNOSIS — E890 Postprocedural hypothyroidism: Secondary | ICD-10-CM | POA: Diagnosis not present

## 2024-02-14 DIAGNOSIS — I10 Essential (primary) hypertension: Secondary | ICD-10-CM | POA: Diagnosis not present

## 2024-02-14 DIAGNOSIS — N1831 Chronic kidney disease, stage 3a: Secondary | ICD-10-CM | POA: Diagnosis not present

## 2024-02-15 DIAGNOSIS — Z87440 Personal history of urinary (tract) infections: Secondary | ICD-10-CM | POA: Diagnosis not present

## 2024-02-15 DIAGNOSIS — E890 Postprocedural hypothyroidism: Secondary | ICD-10-CM | POA: Diagnosis not present

## 2024-02-15 DIAGNOSIS — Z87891 Personal history of nicotine dependence: Secondary | ICD-10-CM | POA: Diagnosis not present

## 2024-02-15 DIAGNOSIS — D649 Anemia, unspecified: Secondary | ICD-10-CM | POA: Diagnosis not present

## 2024-02-15 DIAGNOSIS — E539 Vitamin B deficiency, unspecified: Secondary | ICD-10-CM | POA: Diagnosis not present

## 2024-02-15 DIAGNOSIS — M1990 Unspecified osteoarthritis, unspecified site: Secondary | ICD-10-CM | POA: Diagnosis not present

## 2024-02-15 DIAGNOSIS — E43 Unspecified severe protein-calorie malnutrition: Secondary | ICD-10-CM | POA: Diagnosis not present

## 2024-02-15 DIAGNOSIS — M800AXD Age-related osteoporosis with current pathological fracture, other site, subsequent encounter for fracture with routine healing: Secondary | ICD-10-CM | POA: Diagnosis not present

## 2024-02-15 DIAGNOSIS — Z96652 Presence of left artificial knee joint: Secondary | ICD-10-CM | POA: Diagnosis not present

## 2024-02-15 DIAGNOSIS — R627 Adult failure to thrive: Secondary | ICD-10-CM | POA: Diagnosis not present

## 2024-02-15 DIAGNOSIS — E7800 Pure hypercholesterolemia, unspecified: Secondary | ICD-10-CM | POA: Diagnosis not present

## 2024-02-15 DIAGNOSIS — Z9181 History of falling: Secondary | ICD-10-CM | POA: Diagnosis not present

## 2024-02-15 DIAGNOSIS — R1312 Dysphagia, oropharyngeal phase: Secondary | ICD-10-CM | POA: Diagnosis not present

## 2024-02-15 DIAGNOSIS — I252 Old myocardial infarction: Secondary | ICD-10-CM | POA: Diagnosis not present

## 2024-02-15 DIAGNOSIS — I10 Essential (primary) hypertension: Secondary | ICD-10-CM | POA: Diagnosis not present

## 2024-02-17 DIAGNOSIS — M1990 Unspecified osteoarthritis, unspecified site: Secondary | ICD-10-CM | POA: Diagnosis not present

## 2024-02-17 DIAGNOSIS — I10 Essential (primary) hypertension: Secondary | ICD-10-CM | POA: Diagnosis not present

## 2024-02-17 DIAGNOSIS — R627 Adult failure to thrive: Secondary | ICD-10-CM | POA: Diagnosis not present

## 2024-02-17 DIAGNOSIS — E43 Unspecified severe protein-calorie malnutrition: Secondary | ICD-10-CM | POA: Diagnosis not present

## 2024-02-17 DIAGNOSIS — M800AXD Age-related osteoporosis with current pathological fracture, other site, subsequent encounter for fracture with routine healing: Secondary | ICD-10-CM | POA: Diagnosis not present

## 2024-02-17 DIAGNOSIS — D649 Anemia, unspecified: Secondary | ICD-10-CM | POA: Diagnosis not present

## 2024-02-24 DIAGNOSIS — M1990 Unspecified osteoarthritis, unspecified site: Secondary | ICD-10-CM | POA: Diagnosis not present

## 2024-02-24 DIAGNOSIS — M800AXD Age-related osteoporosis with current pathological fracture, other site, subsequent encounter for fracture with routine healing: Secondary | ICD-10-CM | POA: Diagnosis not present

## 2024-02-24 DIAGNOSIS — R627 Adult failure to thrive: Secondary | ICD-10-CM | POA: Diagnosis not present

## 2024-02-24 DIAGNOSIS — D649 Anemia, unspecified: Secondary | ICD-10-CM | POA: Diagnosis not present

## 2024-02-24 DIAGNOSIS — I10 Essential (primary) hypertension: Secondary | ICD-10-CM | POA: Diagnosis not present

## 2024-02-24 DIAGNOSIS — E43 Unspecified severe protein-calorie malnutrition: Secondary | ICD-10-CM | POA: Diagnosis not present

## 2024-03-03 DIAGNOSIS — M1990 Unspecified osteoarthritis, unspecified site: Secondary | ICD-10-CM | POA: Diagnosis not present

## 2024-03-03 DIAGNOSIS — E039 Hypothyroidism, unspecified: Secondary | ICD-10-CM | POA: Diagnosis not present

## 2024-03-03 DIAGNOSIS — N1831 Chronic kidney disease, stage 3a: Secondary | ICD-10-CM | POA: Diagnosis not present

## 2024-03-03 DIAGNOSIS — I10 Essential (primary) hypertension: Secondary | ICD-10-CM | POA: Diagnosis not present

## 2024-03-03 DIAGNOSIS — E43 Unspecified severe protein-calorie malnutrition: Secondary | ICD-10-CM | POA: Diagnosis not present

## 2024-03-03 DIAGNOSIS — M150 Primary generalized (osteo)arthritis: Secondary | ICD-10-CM | POA: Diagnosis not present

## 2024-03-03 DIAGNOSIS — D649 Anemia, unspecified: Secondary | ICD-10-CM | POA: Diagnosis not present

## 2024-03-03 DIAGNOSIS — E669 Obesity, unspecified: Secondary | ICD-10-CM | POA: Diagnosis not present

## 2024-03-03 DIAGNOSIS — M800AXD Age-related osteoporosis with current pathological fracture, other site, subsequent encounter for fracture with routine healing: Secondary | ICD-10-CM | POA: Diagnosis not present

## 2024-03-03 DIAGNOSIS — E782 Mixed hyperlipidemia: Secondary | ICD-10-CM | POA: Diagnosis not present

## 2024-03-03 DIAGNOSIS — R627 Adult failure to thrive: Secondary | ICD-10-CM | POA: Diagnosis not present

## 2024-03-09 DIAGNOSIS — D649 Anemia, unspecified: Secondary | ICD-10-CM | POA: Diagnosis not present

## 2024-03-09 DIAGNOSIS — M1990 Unspecified osteoarthritis, unspecified site: Secondary | ICD-10-CM | POA: Diagnosis not present

## 2024-03-09 DIAGNOSIS — E43 Unspecified severe protein-calorie malnutrition: Secondary | ICD-10-CM | POA: Diagnosis not present

## 2024-03-09 DIAGNOSIS — M800AXD Age-related osteoporosis with current pathological fracture, other site, subsequent encounter for fracture with routine healing: Secondary | ICD-10-CM | POA: Diagnosis not present

## 2024-03-09 DIAGNOSIS — R627 Adult failure to thrive: Secondary | ICD-10-CM | POA: Diagnosis not present

## 2024-03-09 DIAGNOSIS — I10 Essential (primary) hypertension: Secondary | ICD-10-CM | POA: Diagnosis not present

## 2024-03-12 DIAGNOSIS — Z23 Encounter for immunization: Secondary | ICD-10-CM | POA: Diagnosis not present

## 2024-03-15 DIAGNOSIS — I10 Essential (primary) hypertension: Secondary | ICD-10-CM | POA: Diagnosis not present

## 2024-03-15 DIAGNOSIS — N1831 Chronic kidney disease, stage 3a: Secondary | ICD-10-CM | POA: Diagnosis not present

## 2024-03-18 DIAGNOSIS — Z23 Encounter for immunization: Secondary | ICD-10-CM | POA: Diagnosis not present

## 2024-03-24 DIAGNOSIS — B351 Tinea unguium: Secondary | ICD-10-CM | POA: Diagnosis not present

## 2024-04-03 DIAGNOSIS — E782 Mixed hyperlipidemia: Secondary | ICD-10-CM | POA: Diagnosis not present

## 2024-04-03 DIAGNOSIS — E669 Obesity, unspecified: Secondary | ICD-10-CM | POA: Diagnosis not present

## 2024-04-03 DIAGNOSIS — I10 Essential (primary) hypertension: Secondary | ICD-10-CM | POA: Diagnosis not present

## 2024-04-03 DIAGNOSIS — M150 Primary generalized (osteo)arthritis: Secondary | ICD-10-CM | POA: Diagnosis not present

## 2024-04-03 DIAGNOSIS — N1831 Chronic kidney disease, stage 3a: Secondary | ICD-10-CM | POA: Diagnosis not present

## 2024-04-03 DIAGNOSIS — E039 Hypothyroidism, unspecified: Secondary | ICD-10-CM | POA: Diagnosis not present

## 2024-04-14 DIAGNOSIS — N1831 Chronic kidney disease, stage 3a: Secondary | ICD-10-CM | POA: Diagnosis not present

## 2024-04-14 DIAGNOSIS — I10 Essential (primary) hypertension: Secondary | ICD-10-CM | POA: Diagnosis not present

## 2024-05-03 DIAGNOSIS — E039 Hypothyroidism, unspecified: Secondary | ICD-10-CM | POA: Diagnosis not present

## 2024-05-03 DIAGNOSIS — M150 Primary generalized (osteo)arthritis: Secondary | ICD-10-CM | POA: Diagnosis not present

## 2024-05-03 DIAGNOSIS — E782 Mixed hyperlipidemia: Secondary | ICD-10-CM | POA: Diagnosis not present

## 2024-05-03 DIAGNOSIS — I10 Essential (primary) hypertension: Secondary | ICD-10-CM | POA: Diagnosis not present

## 2024-05-03 DIAGNOSIS — E669 Obesity, unspecified: Secondary | ICD-10-CM | POA: Diagnosis not present

## 2024-05-03 DIAGNOSIS — N1831 Chronic kidney disease, stage 3a: Secondary | ICD-10-CM | POA: Diagnosis not present

## 2024-05-20 ENCOUNTER — Ambulatory Visit: Payer: Self-pay

## 2024-05-20 ENCOUNTER — Ambulatory Visit: Admitting: Psychology

## 2024-05-20 DIAGNOSIS — R4189 Other symptoms and signs involving cognitive functions and awareness: Secondary | ICD-10-CM

## 2024-05-20 DIAGNOSIS — F4329 Adjustment disorder with other symptoms: Secondary | ICD-10-CM

## 2024-05-20 NOTE — Progress Notes (Signed)
° °  Psychometrician Note   Cognitive testing was administered to Kimberly Webb by Lonell Jude, B.S. (psychometrist) under the supervision of Dr. Renda Beckwith, Psy.D., licensed psychologist on 05/20/2024. Kimberly Webb did not appear overtly distressed by the testing session per behavioral observation or responses across self-report questionnaires. Rest breaks were offered.   The battery of tests administered was selected by Dr. Renda Beckwith, Psy.D. with consideration to Kimberly Webb's current level of functioning, the nature of her symptoms, emotional and behavioral responses during interview, level of literacy, observed level of motivation/effort, and the nature of the referral question. This battery was communicated to the psychometrist. Communication between Dr. Renda Beckwith, Psy.D. and the psychometrist was ongoing throughout the evaluation and Dr. Renda Beckwith, Psy.D. was immediately accessible at all times. Dr. Renda Beckwith, Psy.D. provided supervision to the psychometrist on the date of this service to the extent necessary to assure the quality of all services provided.    Kimberly Webb will return within approximately 1-2 weeks for an interactive feedback session with Dr. Beckwith at which time her test performances, clinical impressions, and treatment recommendations will be reviewed in detail. Kimberly Webb understands she can contact our office should she require our assistance before this time.  A total of 100 minutes of billable time were spent face-to-face with Kimberly Webb by the psychometrist. This includes both test administration and scoring time. Billing for these services is reflected in the clinical report generated by Dr. Renda Beckwith, Psy.D.  This note reflects time spent with the psychometrician and does not include test scores or any clinical interpretations made by Dr. Beckwith. The full report will follow in a separate note.

## 2024-05-20 NOTE — Progress Notes (Unsigned)
 NEUROPSYCHOLOGICAL EVALUATION [place]. [facility]  [place] Department of Neurology  Date of Evaluation: 05/20/2024  REASON FOR REFERRAL   Kimberly Webb is an 89 year old, right-handed, White female with 13 years of formal education. She was referred for neuropsychological evaluation by Camie Sevin, PA-C, to assess current neurocognitive functioning, document potential cognitive deficits, and assist with treatment planning. This is her first neuropsychological evaluation.  SUMMARY OF RESULTS   Premorbid cognitive abilities are estimated to be in the high average range based on word reading and sociodemographic factors. Relative to this baseline estimate, current performance was broadly within age-related expectations, with minimal exceptions.  Specifically, her performance was slow on a visual scanning task. While memory was generally within age-related expectations, she showed some relative inefficiency in encoding and spontaneously recalling a word list, although her cued recall and recognition remained intact. Immediate recall of shapes was slightly below expectations, though still normal for her age; however, she still retained what she learned and benefited from recognition cues.  All other measures of working memory, processing speed, executive functioning, language, visuospatial abilities, and learning/memory were within expectations.  On self-report questionnaires, she did not endorse significant symptoms of depression and anxiety.  DIAGNOSTIC IMPRESSION   Results of the current evaluation indicated broadly normal test performance, with the exception of minimal isolated weaknesses that did not appear to reflect broader impairments within their respective domains. Functional independence is largely maintained, although she receives assistance from family with select activities. At this time, the degree of cognitive findings is not consistent with a diagnosable cognitive  disorder. It is likely that the mild inefficiencies observed, along with her subjective cognitive concerns, are most likely related to adjusting to recent changes in her medical status, including a recent diagnosis of hyperparathyroidism, which has also been associated with cognitive changes even in mild or asymptomatic cases. Additional possible contributing factors include cerebrovascular changes and possible hearing loss. At this time, there is no evidence of an emerging neurodegenerative process, and although the future cannot be predicted with certainty, current findings are reassuring. Results provide a baseline for future comparison should reevaluation become necessary.  ICD-10 Codes: F43.29 Adjustment reaction; R41.89 Cognitive change  RECOMMENDATIONS   In consultation with your doctor, schedule cognitive reevaluation on an as-needed basis to assess for cognitive decline and update treatment recommendations. Reevaluation should occur during a period of medical and affective stability.  Prioritize physical health through diet, exercise, and sleep. Regular physical activity supports cardiovascular health, improves mood, and helps preserve mobility and independence. Aim for at least 150 minutes of moderate aerobic exercise per week (e.g., brisk walking, swimming, gardening). A brain-healthy diet such as the Mediterranean or MIND diet is rich in fruits, vegetables, whole grains, healthy fats, and lean proteins, and has been associated with reduced risk of cognitive decline. Additionally, getting adequate, quality sleep and managing chronic conditions with the help of healthcare providers are essential components of healthy aging.  Continue to stay socially and mentally engaged. Maintaining strong social connections and regularly stimulating your brain can help protect against cognitive decline. This includes staying connected with friends and family, volunteering, or participating in community groups.  Mentally engaging activities--such as reading, doing puzzles, playing strategy games, or learning a new language or musical instrument--promote brain plasticity. If you are interested in activities to support cognitive engagement, this site offers a variety of apps and games organized by difficulty level:  https://www.barrow[facility].org/get-to-know-barrow/centers-programs/neurorehabilitation-center/neuro-rehab-apps-and-games/  Consider implementing compensatory strategies to maximize independence and maintain daily functioning. Examples include:  Adhere to routine. Compensatory strategies work best when they are used consistently. Use a planner, calendar, or white board that has the schedule and important events for the day clearly listed to reference and cross off when tasks are complete.  Ask for written information, especially if it is new or unfamiliar (e.g., information provided at a doctor's appointment).  Create an organized environment. Keep items that can be easily misplaced in a sensible location and get into the habit of always returning the items to those places. Pay attention and reduce distractions. Make a point of focusing attention on information you want to remember. One-on-one interaction is more likely to facilitate attention and minimize distraction. Make eye contact and repeat the information out loud after you hear it. Reduce interruptions or distractions especially when attempting to learn new information.  Create associations. When learning something new, think about and understand the information. Explain it in your own words or try to associate it with something you already know. Take notes to help remember important details. Evaluate goals and plan accordingly. When confronted by many different tasks, begin by making a list that prioritizes each task and estimates the time it will take to complete. Break down complicated tasks into smaller, more manageable steps. Focus on one task  at a time and complete each task before starting another. Avoid multitasking.  DISPOSITION   Patient will follow up with the referring provider, Ms. Wertman. No follow-up neuropsychological testing was scheduled at this time. Please feel free to refer the patient for repeated evaluation if she shows a significant change in neurocognitive status. She and her daughter will be provided verbal feedback in approximately one week regarding the findings and impression during this visit.  The remainder of the report includes the details of the patient's background and a table of results from the current evaluation, which support the summary and recommendations described above.  BACKGROUND   History of Presenting Illness: The following information was obtained from a review of medical records and an interview with the patient and her daughter, Particia. Briefly, the patient sustained a mechanical fall with concussion in April 2025. At that time, her family was already reporting mild, intermittent confusion at baseline; however, following the fall, she became progressively more debilitated, with increased cognitive impairment, fatigue, difficulty ambulating, and poor appetite. Labs revealed acute renal failure and hypercalcemia. She subsequently participated in rehabilitation therapies and was discharged to outpatient care on 10/08/2023. She returned to the hospital on 10/14/2023 for evaluation of weakness, expressive aphasia, and dysarthria, prompting a code stroke. She was discharged less than one week later on 10/19/2023 with diagnoses of hypercalcemia and metabolic encephalopathy, with concern for possible hyperparathyroidism and dementia. Approximately one week later, she re-presented to the emergency department with fatigue and lethargy, again suspected to be secondary to recurrent hyperparathyroidism and a possible ongoing or recurrent urinary tract infection. She was later evaluated by Camie Sevin, PA-C, at  [facility] on 11/18/2023 for the concerns of possible dementia that were raised during her hospitalizations, especially as she was felt not to have returned to her cognitive baseline. MoCA = 17/30. She was referred for neuropsychological evaluation but was advised to defer testing for six months given her recent acute medical issues, which she did.  Cognitive Functioning: During todays appointment, the patient and her daughter reported significant improvement over the past six months, with the daughter stating that the patient is pretty darn close to her baseline. The daughter explained that their concerns began around the  time of the patients fall and subsequent hospitalizations and that they believe most of the issues were related to hyperparathyroidism. Now the only concerns the patient endorses are occasional difficulty recalling names and needing additional time for word retrieval, though she is ultimately able to do so. Otherwise, both the patient and her daughter largely denied difficulties with attention, processing speed, short-term memory, language, visuospatial abilities, or executive functioning.  Physical Functioning: Patient denied difficulties with sleep initiation or maintenance. Appetite had initially declined significantly following the fall, with substantial weight loss; however, her appetite has since stabilized. She reported no changes in taste or smell. Vision and hearing are largely stable, though she acknowledged possible mild relative hearing loss. She continues to experience balance difficulties and has a history of multiple falls resulting in fractures, including injuries to the neck, shoulder, and leg. She denied any tremors.  Emotional Functioning: Patient described her recent mood as good and denied any suicidal ideation. She engages in various cognitively stimulating activities throughout the week, including reading the newspaper and playing solitaire on the  computer.  Neuroimaging: MRI of the brain (10/15/2023) documented chronic small vessel ischemia and generalized volume loss.  Other Relevant Medical History: Remarkable for hypertension, hypercholesterolemia, hypothyroidism, arthritis, and osteoporosis. Please refer to the medical record for a more comprehensive problem list. No history of stroke, CNS infection, head injury, or seizure was reported.  Current Medications: Per record, acetaminophen , acyclovir , amlodipine , glucosamine, levothyroxine , lidocaine , lisinopril , magnesium  hydroxide, melatonin, methocarbamol , mirtazapine, multivitamin, niacin , nystatin , ondansetron , polyethylene glycol, senna, simvastatin , sulfamethoxazole -trimethoprim , thiamine , and zinc  oxide.  Functional Status: Patient independently performs all basic activities of daily living. She stopped driving within the past few months at the request of her children, although she had no prior issues with accidents, tickets, or navigation. When asked about returning to driving, she confirmed that she prefers to not to resume driving. She manages her own medications, as well as her husbands, without difficulty. Her daughter assumed management of the finances around the time of recent hospitalizations due to mild confusion; she has continued handling them since, partly because it is easier and partly because the patient had previously expressed interest in turning over financial responsibilities as a proactive measure. Patient remains involved in opening mail, noting due dates on bills. She also denies any difficulties operating household appliances or preparing her own meals.  Family Neurological History: Remarkable for Parkinson's disease in the patient's brother.  Psychiatric History: Patient denied history of depression, anxiety, prior mental health treatment, suicidal ideation, hallucinations, and psychiatric hospitalizations.  Substance Use History: Patient denied current use of  alcohol, nicotine, marijuana, and other illicit substances.  Social and Developmental History: Patient was born in [place], [place]. History of perinatal complications and developmental delays was not reported. She is married and has two daughters. She lives with her husband, one of her daughters, and her grandson.  Educational and Occupational History: No history of childhood learning disability, special education services, or grade retention was reported. 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 She graduated high school on time and completed one year of college. Prior to retirement, she primarily worked in estate manager/land agent.  BEHAVIORAL OBSERVATIONS   Patient arrived on time and was accompanied by her daughter, Particia. She ambulated slowly, with a walker. She was alert and fully oriented. She was appropriately groomed and dressed for the setting. No significant sensory or motor abnormalities were observed. Vision (with glasses) and hearing were adequate for testing purposes. Speech was of normal rate, prosody, and  volume. No conversational word-finding difficulties, paraphasic errors, or dysarthria were observed. Comprehension was conversationally intact. Thought processes were linear, logical, and coherent. Thought content was organized and devoid of delusions. Insight appeared appropriate. Affect was even and congruent with euthymic mood. She was cooperative and appeared to give adequate effort during testing, including on embedded measures of performance validity. Results are thought to accurately reflect her cognitive functioning at this time.  NEUROPSYCHOLOGICAL TESTING RESULTS   Tests Administered: Animal Naming Test; Brief Visuospatial Memory Test-Revised (BVMT-R) - Form 1; [place]  Verbal Learning Test Third Edition (CVLT3) - Brief Form; Controlled Oral Word Association Test (COWAT): FAS; Delis-Kaplan Executive Function System (D-KEFS) - Subtest(s): Color-Word Interference Test;  Geriatric Anxiety Scale-10 Item (GAS-10); Geriatric Depression Scale Short Form (GDS-SF); Neuropsychological Assessment Battery (NAB) - Subtest(s): Naming Form 1; Repeatable Battery for the Assessment of Neuropsychological Status Update (RBANS Update) Form A - Subtest(s): Line Orientation; Test of Premorbid Functioning (TOPF); Trail Making Test (TMT); Wechsler Adult Intelligence Scale Fifth Edition (WAIS-5) - Subtest(s): Similarities, Clinical Cytogeneticist, Digits Forward, Digit Sequencing, Coding, Symbol Search, Digits Backward; and Wechsler Memory Scale Fourth Edition (WMS-IV) - Subtest(s): Logical Memory (LM).  Test results are provided in the table below. Whenever possible, the patient's scores were compared against age-, sex-, and education-corrected normative samples. Interpretive descriptions are based on the AACN consensus conference statement on uniform labeling (Guilmette et al., 2020).  PREMORBID FUNCTIONING RAW  RANGE  TOPF 51 StdS=110 High Average  ATTENTION & WORKING MEMORY RAW  RANGE  WAIS-5 Digits Forward -- ss=10 Average  WAIS-5 Digits Backward -- ss=10 Average  WAIS-5 Digit Sequencing -- ss=10 Average  PROCESSING SPEED RAW  RANGE  Trails A 66''0e T=34 Below Average  WAIS-5 Coding  -- ss=9 Average  WAIS-5 Symbol Search -- ss=8 Average  DKEFS CWIT Color Naming 33''1e ss=11 Average  DKEFS CWIT Word Reading 23''0e ss=12 High Average  EXECUTIVE FUNCTION RAW  RANGE  Trails B 174''1e T=39 Low Average  WAIS-5 Similarities -- ss=11 Average  COWAT Letter Fluency 14+11+17 T=54 Average  DKEFS CWIT Inhibition 58''1e ss=14 High Average  DKEFS CWIT Inhibition/Switching 55''4e ss=15 Above Average  LANGUAGE RAW  RANGE  COWAT Letter Fluency 14+11+17 T=54 Average  Animal Naming Test 13 T=38 Low Average  NAB Naming Test 28/31 T=46 WNL  VISUOSPATIAL RAW  RANGE  RBANS Line Orientation -- 26-50%ile Average  WAIS-5 Block Design -- ss=10 Average  BVMT-R Copy Trial 12/12 -- WNL  VERBAL LEARNING & MEMORY  RAW  RANGE  CVLT3 Total 1-4 (3+4+4+5)/36 StdS=73 Below Average  CVLT3 SDFR  4/9 ss=5 Below Average  CVLT3 LDFR  0/9 ss=1 Exceptionally Low  CVLT3 LDCR  6/9 ss=8 Average  CVLT3 Recognition Hits 9 ss=13 High Average  CVLT3 Recognition False+ 1 ss=10 Average  CVLT3 Discriminability -- ss=11 Average  CVLT3 Intrusions 0 ss=13 High Average  CVLT3 Repetitions 0 ss=12 High Average  CVLT3 Forced Choice 9/9 -- WNL  WMS-IV LM-I  (4+6+7)/53 ss=7 Low Average  WMS-IV LM-II  (4+7)/39 ss=10 Average  WMS-IV LM Recognition  (7+14)/23 >75%ile High Average to Exceptionally High  VISUAL LEARNING & MEMORY RAW  RANGE  BVMT-R Trial 1 2/12 T=40 Low Average  BVMT-R Trial 2 3/12 T=42 Low Average  BVMT-R Trial 3 3/12 T=40 Low Average  BVMT-R Total Recall 8/36 T=40 Low Average  BVMT-R Delayed Recall 2/12 T=39 Low Average  BVMT-R Recognition Hits 5 -- --  BVMT-R Recognition False Alarms 1 -- --  BVMT-R Recognition Discrimination Index 4 T=45 Average  *From [employer]  et al. (2022) -- -- --  QUESTIONNAIRES RAW  RANGE  GDS-SF 0 -- Minimal  GAS-10 1 -- Minimal  *Note: ss = scaled score; StdS = standard score; T = t-score; C/S = corrected raw score; WNL = within normal limits; BNL= below normal limits; D/C = discontinued. Scores from skewed distributions are typically interpreted as WNL (>=16th %ile) or BNL (<16th %ile).   INFORMED CONSENT   Patient was provided with a verbal description of the nature and purpose of the neuropsychological evaluation. Also reviewed were the foreseeable risks and/or discomforts and benefits of the procedure, limits of confidentiality, and mandatory reporting requirements of this provider. Patient was given the opportunity to have their questions answered. Oral consent to participate was provided by the patient.   This report was prepared as part of a clinical evaluation and is not intended for forensic use.  SERVICE   This evaluation was conducted by Renda Beckwith, Psy.D. In addition  to time spent directly with the patient, total professional time (120 minutes) includes record review, integration of relevant medical history, test selection, interpretation of findings, and report preparation. A technician, Lonell Jude, B.S., provided testing and scoring assistance (100 minutes).  Psychiatric Diagnostic Evaluation Services (Professional): [phone] x 1 Neuropsychological Testing Evaluation Services (Professional): [phone] x 1 Neuropsychological Testing Evaluation Services (Professional): 03866 x 1 Neuropsychological Test Administration and Scoring Radiographer, Therapeutic): [phone] x 1 Neuropsychological Test Administration and Scoring (Technician): [phone] x 2  This report was generated using voice recognition software. While this document has been carefully reviewed, transcription errors may be present. I apologize in advance for any inconvenience. Please contact me if further clarification is needed.            Renda Beckwith, Psy.D.             Neuropsychologist

## 2024-06-05 ENCOUNTER — Ambulatory Visit (INDEPENDENT_AMBULATORY_CARE_PROVIDER_SITE_OTHER): Admitting: Psychology

## 2024-06-05 DIAGNOSIS — F4329 Adjustment disorder with other symptoms: Secondary | ICD-10-CM

## 2024-06-05 DIAGNOSIS — R4189 Other symptoms and signs involving cognitive functions and awareness: Secondary | ICD-10-CM | POA: Diagnosis not present

## 2024-06-05 NOTE — Progress Notes (Signed)
" ° °  NEUROPSYCHOLOGY FEEDBACK SESSION [place]. [facility]  [place] Department of Neurology  Date of Feedback Session: 06/05/2024  REASON FOR REFERRAL   Kimberly Webb is an 89 year old, right-handed, White female with 13 years of formal education. She was referred for neuropsychological evaluation by Camie Sevin, PA-C, to assess current neurocognitive functioning, document potential cognitive deficits, and assist with treatment planning. This is her first neuropsychological evaluation.  FEEDBACK   Patient completed a comprehensive neuropsychological evaluation on 05/20/2024. Please refer to that encounter for the full report and recommendations. Briefly, results indicated broadly normal test performance, with the exception of minimal isolated weaknesses that did not appear to reflect broader impairments within their respective domains. Functional independence is largely maintained. The degree of cognitive findings is not consistent with a diagnosable cognitive disorder. It is likely that the mild inefficiencies observed, along with her subjective cognitive concerns, are related to adjusting to recent changes in her medical status, including a recent diagnosis of hyperparathyroidism, which has also been associated with cognitive changes even in mild or asymptomatic cases. Additional possible contributing factors include cerebrovascular changes and possible hearing loss. At this time, there is no evidence of an emerging neurodegenerative process   Today, the patient was accompanied by her daughter. They were provided verbal feedback regarding the findings and impression during this visit, and their questions were answered. A copy of the report was provided at the conclusion of the visit.  DISPOSITION   No follow-up neuropsychological testing was scheduled at this time. Please feel free to refer the patient for repeated evaluation if she shows a significant change in neurocognitive  status.  SERVICE   This feedback session was conducted by Renda Beckwith, Psy.D. [place] unit of [phone] (35 minutes) was billed for Dr. Beckwith' time spent in preparing, conducting, and documenting the current feedback session.  This report was generated using voice recognition software. While this document has been carefully reviewed, transcription errors may be present. I apologize in advance for any inconvenience. Please contact me if further clarification is needed.  "

## 2024-06-19 ENCOUNTER — Ambulatory Visit: Admitting: Physician Assistant
